# Patient Record
Sex: Female | Born: 1970
Health system: Southern US, Community
[De-identification: ages and names within clinical notes are randomized; demographics above are authoritative.]

## PROBLEM LIST (undated history)

## (undated) DIAGNOSIS — I10 Essential (primary) hypertension: Secondary | ICD-10-CM

## (undated) DIAGNOSIS — M25512 Pain in left shoulder: Secondary | ICD-10-CM

## (undated) DIAGNOSIS — R609 Edema, unspecified: Secondary | ICD-10-CM

## (undated) DIAGNOSIS — M549 Dorsalgia, unspecified: Secondary | ICD-10-CM

## (undated) DIAGNOSIS — E785 Hyperlipidemia, unspecified: Secondary | ICD-10-CM

## (undated) DIAGNOSIS — Z86718 Personal history of other venous thrombosis and embolism: Secondary | ICD-10-CM

## (undated) DIAGNOSIS — R569 Unspecified convulsions: Secondary | ICD-10-CM

## (undated) HISTORY — DX: Personal history of other venous thrombosis and embolism: Z86.718

## (undated) HISTORY — PX: CHOLECYSTECTOMY: SHX55

## (undated) HISTORY — DX: Dorsalgia, unspecified: M54.9

## (undated) HISTORY — DX: Pain in left shoulder: M25.512

## (undated) HISTORY — DX: Hyperlipidemia, unspecified: E78.5

## (undated) HISTORY — DX: Edema, unspecified: R60.9

## (undated) HISTORY — DX: Essential (primary) hypertension: I10

## (undated) HISTORY — DX: Unspecified convulsions: R56.9

---

## 1998-10-07 ENCOUNTER — Other Ambulatory Visit: Admission: RE | Admit: 1998-10-07 | Discharge: 1998-10-07 | Payer: Self-pay | Admitting: Obstetrics and Gynecology

## 1999-10-14 ENCOUNTER — Other Ambulatory Visit: Admission: RE | Admit: 1999-10-14 | Discharge: 1999-10-14 | Payer: Self-pay | Admitting: Obstetrics and Gynecology

## 2000-02-06 ENCOUNTER — Encounter: Admission: RE | Admit: 2000-02-06 | Discharge: 2000-02-06 | Payer: Self-pay | Admitting: Family Medicine

## 2000-02-06 ENCOUNTER — Encounter: Payer: Self-pay | Admitting: Family Medicine

## 2000-02-15 ENCOUNTER — Encounter: Admission: RE | Admit: 2000-02-15 | Discharge: 2000-02-15 | Payer: Self-pay | Admitting: Family Medicine

## 2000-02-15 ENCOUNTER — Encounter: Payer: Self-pay | Admitting: Family Medicine

## 2000-02-29 ENCOUNTER — Encounter (INDEPENDENT_AMBULATORY_CARE_PROVIDER_SITE_OTHER): Payer: Self-pay | Admitting: Specialist

## 2000-02-29 ENCOUNTER — Observation Stay (HOSPITAL_COMMUNITY): Admission: RE | Admit: 2000-02-29 | Discharge: 2000-03-01 | Payer: Self-pay | Admitting: *Deleted

## 2000-10-29 ENCOUNTER — Other Ambulatory Visit: Admission: RE | Admit: 2000-10-29 | Discharge: 2000-10-29 | Payer: Self-pay | Admitting: Obstetrics and Gynecology

## 2002-07-03 ENCOUNTER — Encounter: Admission: RE | Admit: 2002-07-03 | Discharge: 2002-07-03 | Payer: Self-pay | Admitting: Orthopedic Surgery

## 2002-07-03 ENCOUNTER — Encounter: Payer: Self-pay | Admitting: Orthopedic Surgery

## 2005-11-09 ENCOUNTER — Encounter: Admission: RE | Admit: 2005-11-09 | Discharge: 2005-11-09 | Payer: Self-pay | Admitting: Orthopedic Surgery

## 2005-11-09 ENCOUNTER — Observation Stay (HOSPITAL_COMMUNITY): Admission: AD | Admit: 2005-11-09 | Discharge: 2005-11-10 | Payer: Self-pay | Admitting: Orthopedic Surgery

## 2006-02-07 ENCOUNTER — Encounter: Payer: Self-pay | Admitting: Vascular Surgery

## 2006-02-07 ENCOUNTER — Ambulatory Visit (HOSPITAL_COMMUNITY): Admission: RE | Admit: 2006-02-07 | Discharge: 2006-02-07 | Payer: Self-pay | Admitting: Family Medicine

## 2006-05-09 ENCOUNTER — Ambulatory Visit (HOSPITAL_COMMUNITY): Admission: RE | Admit: 2006-05-09 | Discharge: 2006-05-09 | Payer: Self-pay | Admitting: Family Medicine

## 2006-05-09 ENCOUNTER — Ambulatory Visit: Payer: Self-pay | Admitting: Vascular Surgery

## 2006-08-03 ENCOUNTER — Ambulatory Visit (HOSPITAL_COMMUNITY): Admission: RE | Admit: 2006-08-03 | Discharge: 2006-08-03 | Payer: Self-pay | Admitting: Family Medicine

## 2006-08-03 ENCOUNTER — Ambulatory Visit: Payer: Self-pay | Admitting: Vascular Surgery

## 2006-08-03 ENCOUNTER — Encounter: Payer: Self-pay | Admitting: Family Medicine

## 2007-11-06 ENCOUNTER — Encounter: Payer: Self-pay | Admitting: Family Medicine

## 2008-02-07 ENCOUNTER — Encounter: Payer: Self-pay | Admitting: Family Medicine

## 2008-04-20 ENCOUNTER — Ambulatory Visit: Payer: Self-pay | Admitting: Family Medicine

## 2008-04-20 DIAGNOSIS — Z86718 Personal history of other venous thrombosis and embolism: Secondary | ICD-10-CM | POA: Insufficient documentation

## 2008-04-20 DIAGNOSIS — I10 Essential (primary) hypertension: Secondary | ICD-10-CM

## 2008-04-20 DIAGNOSIS — R569 Unspecified convulsions: Secondary | ICD-10-CM

## 2008-04-20 HISTORY — DX: Personal history of other venous thrombosis and embolism: Z86.718

## 2008-04-20 HISTORY — DX: Essential (primary) hypertension: I10

## 2008-04-20 HISTORY — DX: Unspecified convulsions: R56.9

## 2008-05-01 ENCOUNTER — Ambulatory Visit: Payer: Self-pay | Admitting: Family Medicine

## 2008-05-01 LAB — CONVERTED CEMR LAB
Bilirubin Urine: NEGATIVE
Glucose, Urine, Semiquant: NEGATIVE
Specific Gravity, Urine: 1.02
Urobilinogen, UA: 0.2

## 2008-05-04 LAB — CONVERTED CEMR LAB
Albumin: 3.9 g/dL (ref 3.5–5.2)
BUN: 10 mg/dL (ref 6–23)
Calcium: 9.4 mg/dL (ref 8.4–10.5)
Cholesterol: 240 mg/dL (ref 0–200)
Eosinophils Relative: 1.2 % (ref 0.0–5.0)
GFR calc Af Amer: 146 mL/min
Glucose, Bld: 112 mg/dL — ABNORMAL HIGH (ref 70–99)
HCT: 40.3 % (ref 36.0–46.0)
Hemoglobin: 13.9 g/dL (ref 12.0–15.0)
Monocytes Absolute: 0.5 10*3/uL (ref 0.1–1.0)
Monocytes Relative: 7.9 % (ref 3.0–12.0)
Neutro Abs: 4.3 10*3/uL (ref 1.4–7.7)
Phenobarbital: 12.8 ug/mL — ABNORMAL LOW (ref 15.0–40.0)
RDW: 11.9 % (ref 11.5–14.6)
Sodium: 139 meq/L (ref 135–145)
TSH: 2.72 microintl units/mL (ref 0.35–5.50)
Total CHOL/HDL Ratio: 5.9
Total Protein: 7.1 g/dL (ref 6.0–8.3)

## 2008-09-03 ENCOUNTER — Ambulatory Visit: Payer: Self-pay | Admitting: Family Medicine

## 2008-09-03 DIAGNOSIS — M549 Dorsalgia, unspecified: Secondary | ICD-10-CM | POA: Insufficient documentation

## 2008-09-03 HISTORY — DX: Dorsalgia, unspecified: M54.9

## 2008-10-06 ENCOUNTER — Ambulatory Visit: Payer: Self-pay | Admitting: Family Medicine

## 2008-10-06 DIAGNOSIS — N3000 Acute cystitis without hematuria: Secondary | ICD-10-CM

## 2008-10-06 LAB — CONVERTED CEMR LAB
Nitrite: NEGATIVE
Specific Gravity, Urine: 1.015

## 2008-11-20 ENCOUNTER — Telehealth: Payer: Self-pay | Admitting: Family Medicine

## 2008-11-24 ENCOUNTER — Ambulatory Visit: Payer: Self-pay | Admitting: Family Medicine

## 2008-11-26 ENCOUNTER — Telehealth: Payer: Self-pay | Admitting: Family Medicine

## 2008-12-31 ENCOUNTER — Ambulatory Visit: Payer: Self-pay | Admitting: Family Medicine

## 2008-12-31 DIAGNOSIS — S93409A Sprain of unspecified ligament of unspecified ankle, initial encounter: Secondary | ICD-10-CM | POA: Insufficient documentation

## 2009-04-15 ENCOUNTER — Telehealth: Payer: Self-pay | Admitting: Family Medicine

## 2009-05-06 ENCOUNTER — Telehealth: Payer: Self-pay | Admitting: Family Medicine

## 2009-05-07 ENCOUNTER — Ambulatory Visit: Payer: Self-pay | Admitting: Family Medicine

## 2009-05-12 ENCOUNTER — Telehealth (INDEPENDENT_AMBULATORY_CARE_PROVIDER_SITE_OTHER): Payer: Self-pay | Admitting: *Deleted

## 2009-05-18 ENCOUNTER — Telehealth: Payer: Self-pay | Admitting: Family Medicine

## 2009-06-01 ENCOUNTER — Ambulatory Visit: Payer: Self-pay | Admitting: Family Medicine

## 2009-06-15 ENCOUNTER — Ambulatory Visit: Payer: Self-pay | Admitting: Family Medicine

## 2009-06-21 ENCOUNTER — Telehealth: Payer: Self-pay

## 2009-06-21 LAB — CONVERTED CEMR LAB
AST: 19 units/L (ref 0–37)
BUN: 8 mg/dL (ref 6–23)
Basophils Absolute: 0.1 10*3/uL (ref 0.0–0.1)
Calcium: 9.1 mg/dL (ref 8.4–10.5)
Eosinophils Absolute: 0.1 10*3/uL (ref 0.0–0.7)
GFR calc non Af Amer: 120.05 mL/min (ref >60)
Glucose, Bld: 74 mg/dL (ref 70–99)
HCT: 39.1 % (ref 36.0–46.0)
Lymphs Abs: 1.9 10*3/uL (ref 0.7–4.0)
MCHC: 33.3 g/dL (ref 30.0–36.0)
Monocytes Relative: 9 % (ref 3.0–12.0)
Platelets: 296 10*3/uL (ref 150.0–400.0)
Potassium: 3.8 meq/L (ref 3.5–5.1)
RDW: 11.8 % (ref 11.5–14.6)
TSH: 3.66 microintl units/mL (ref 0.35–5.50)
Total Bilirubin: 0.3 mg/dL (ref 0.3–1.2)

## 2009-10-20 ENCOUNTER — Encounter: Payer: Self-pay | Admitting: Family Medicine

## 2009-11-30 ENCOUNTER — Telehealth: Payer: Self-pay | Admitting: Family Medicine

## 2010-04-09 ENCOUNTER — Other Ambulatory Visit (HOSPITAL_COMMUNITY): Payer: Self-pay | Admitting: Obstetrics and Gynecology

## 2010-04-09 DIAGNOSIS — Z3682 Encounter for antenatal screening for nuchal translucency: Secondary | ICD-10-CM

## 2010-04-13 ENCOUNTER — Encounter: Payer: Self-pay | Admitting: Family Medicine

## 2010-04-19 NOTE — Progress Notes (Signed)
Summary: change meds.  Phone Note Call from Patient   Caller: Patient Call For: [name] MD Summary of Call: OB GYN MD would like Dr. Clent Ridges to change her BP medication since she is attempting pregnancy.  [facility] [phone] She is taking Lisinopril/Hctz. Initial call taken by: Lynann Beaver CMA,  May 06, 2009 10:17 AM  Follow-up for Phone Call        we can do this, but she needs an OV to do this Follow-up by: [name] MD,  May 06, 2009 10:34 AM  Additional Follow-up for Phone Call Additional follow up Details #1::        Office visit scheduled. Additional Follow-up by: Lynann Beaver CMA,  May 06, 2009 11:04 AM

## 2010-04-19 NOTE — Progress Notes (Signed)
Summary: refill HCTZ  Phone Note Call from Patient   Caller: Patient Call For: Lauren Salisbury MD Summary of Call: HCTZ 25 mg. ....Marland KitchenMarland Kitchenwants to go back on this due to retaining fluids. The pregnancy has to be put on hold for a while. [facility] [phone] Initial call taken by: Lynann Beaver CMA,  November 30, 2009 4:43 PM  Follow-up for Phone Call        okay, call in #30 with 11 rf Follow-up by: Lauren Salisbury MD,  December 01, 2009 1:08 PM  Additional Follow-up for Phone Call Additional follow up Details #1::        done  pt aware Additional Follow-up by: Pura Spice, RN,  December 01, 2009 1:22 PM    New/Updated Medications: HYDROCHLOROTHIAZIDE 25 MG  TABS (HYDROCHLOROTHIAZIDE) Take 1 tab by mouth every morning Prescriptions: HYDROCHLOROTHIAZIDE 25 MG  TABS (HYDROCHLOROTHIAZIDE) Take 1 tab by mouth every morning  #30 x 11   Entered by:   Pura Spice, RN   Authorized by:   Lauren Salisbury MD   Signed by:   Pura Spice, RN on 12/01/2009   Method used:   Electronically to        [facility]* (retail)       [address]       [address] Shipping/mailing       [place], [place]  [address]       Ph: [phone]       Fax: [phone]   RxID:   [id number]

## 2010-04-19 NOTE — Progress Notes (Signed)
Summary: Lab Results  Phone Note Call from Patient Call back at Home Phone [phone]   Caller: Patient Reason for Call: Acute Illness Summary of Call: Patient looking for lab results. Initial call taken by: Everrett Coombe,  June 21, 2009 8:43 AM  Follow-up for Phone Call        attempt to call - LMTCB if questions labs all norm.  KIK Follow-up by: Duard Brady LPN,  June 21, 2009 10:39 AM

## 2010-04-19 NOTE — Assessment & Plan Note (Signed)
Summary: 2 week fup//ccm   Vital Signs:  Patient profile:   40 year old female Temp:     98.3 degrees F oral BP sitting:   116 / 78  (left arm) Cuff size:   large  Vitals Entered By: Raechel Ache, RN (June 15, 2009 1:07 PM) CC: F/u.   History of Present Illness: Here to follow up on HTN. She is doing well on methyldopa with stable BPs. She continues to have hand and foot swelling, but she is tolerating this well.   Allergies: No Known Drug Allergies  Past History:  Past Medical History: Reviewed history from 04/20/2008 and no changes required. Hypertension complex partial seizures, began at age 29, last was in 1995 LE edema DVT, hx of, in right lower leg 8-07 secondary to ankle fracture, on Coumadin for 9 months left shoulder pain, sees Dr. Chaney Malling sees Dr. Henderson Cloud for GYN exams  Review of Systems  The patient denies anorexia, fever, weight loss, weight gain, vision loss, decreased hearing, hoarseness, chest pain, syncope, dyspnea on exertion, prolonged cough, headaches, hemoptysis, abdominal pain, melena, hematochezia, severe indigestion/heartburn, hematuria, incontinence, genital sores, muscle weakness, suspicious skin lesions, transient blindness, difficulty walking, depression, unusual weight change, abnormal bleeding, enlarged lymph nodes, angioedema, breast masses, and testicular masses.    Physical Exam  General:  overweight-appearing.   Neck:  No deformities, masses, or tenderness noted. Lungs:  Normal respiratory effort, chest expands symmetrically. Lungs are clear to auscultation, no crackles or wheezes. Heart:  Normal rate and regular rhythm. S1 and S2 normal without gallop, murmur, click, rub or other extra sounds. Extremities:  2+ edema of hands and feet    Impression & Recommendations:  Problem # 1:  HYPERTENSION (ICD-401.9)  Her updated medication list for this problem includes:    Methyldopa 250 Mg Tabs (Methyldopa) .Marland Kitchen..Marland Kitchen Two times a  day  Orders: Venipuncture ([address]) TLB-BMP (Basic Metabolic Panel-BMET) (80048-METABOL) TLB-CBC Platelet - w/Differential (85025-CBCD) TLB-Hepatic/Liver Function Pnl (80076-HEPATIC) TLB-TSH (Thyroid Stimulating Hormone) (84443-TSH) UA Dipstick w/o Micro (automated)  (81003)  Complete Medication List: 1)  Phenobarbital 100 Mg Tabs (Phenobarbital) .... One tablet twice daily 2)  Diazepam 5 Mg Tabs (Diazepam) .... One by mouth q 8 hours as needed 3)  Methyldopa 250 Mg Tabs (Methyldopa) .... Two times a day  Patient Instructions: 1)  Get labs today.  2)  Please schedule a follow-up appointment in 3 months .   Appended Document: 2 week fup//ccm  Laboratory Results   Urine Tests  Date/Time Received: June 15, 2009 3:22 PM Date/Time Reported: June 15, 2009 3:22 PM  Routine Urinalysis   Color: yellow Appearance: Clear Glucose: negative   (Normal Range: Negative) Bilirubin: negative   (Normal Range: Negative) Ketone: trace (5)   (Normal Range: Negative) Spec. Gravity: >=1.030   (Normal Range: 1.003-1.035) Blood: negative   (Normal Range: Negative) pH: 5.0   (Normal Range: 5.0-8.0) Protein: negative   (Normal Range: Negative) Urobilinogen: 0.2   (Normal Range: 0-1) Nitrite: negative   (Normal Range: Negative) Leukocyte Esterace: trace   (Normal Range: Negative)    Comments: Rita Ohara  June 15, 2009 3:22 PM

## 2010-04-19 NOTE — Assessment & Plan Note (Signed)
Summary: discuss changing BP meds due to pregnancy?dm   Vital Signs:  Patient profile:   40 year old female Temp:     98.5 degrees F oral Pulse rate:   107 / minute BP sitting:   114 / 94  (left arm) Cuff size:   large  Vitals Entered By: Alfred Levins, CMA (May 07, 2009 4:23 PM) CC: needs to change  BP med per OB dr, she is planning a pregnancy   History of Present Illness: Here to discuss BP meds. She has been on HCTZ and lisinopril for several years and has done well. She is considering trying to get pregnant soon, and she has discussed this with Dr. Henderson Cloud. She was concerned about her current meds and asked [place] to consider changing them. Lauren Espinoza feels fine.   Current Medications (verified): 1)  Lisinopril 20 Mg Tabs (Lisinopril) .Marland Kitchen.. 1 Tablet By Mouth Daily 2)  Hydrochlorothiazide 25 Mg  Tabs (Hydrochlorothiazide) .... Take 1 Tab By Mouth Every Morning 3)  Phenobarbital 100 Mg Tabs (Phenobarbital) .... One Tablet Twice Daily 4)  Diazepam 5 Mg Tabs (Diazepam) .... One By Mouth Q 8 Hours As Needed  Allergies (verified): No Known Drug Allergies  Past History:  Past Medical History: Reviewed history from 04/20/2008 and no changes required. Hypertension complex partial seizures, began at age 49, last was in 1995 LE edema DVT, hx of, in right lower leg 8-07 secondary to ankle fracture, on Coumadin for 9 months left shoulder pain, sees Dr. Chaney Malling sees Dr. Henderson Cloud for GYN exams  Past Surgical History: Reviewed history from 04/20/2008 and no changes required. Cholecystectomy  Review of Systems  The patient denies anorexia, fever, weight loss, weight gain, vision loss, decreased hearing, hoarseness, chest pain, syncope, dyspnea on exertion, peripheral edema, prolonged cough, headaches, hemoptysis, abdominal pain, melena, hematochezia, severe indigestion/heartburn, hematuria, incontinence, genital sores, muscle weakness, suspicious skin lesions, transient blindness,  difficulty walking, depression, unusual weight change, abnormal bleeding, enlarged lymph nodes, angioedema, breast masses, and testicular masses.    Physical Exam  General:  overweight-appearing.   Neck:  No deformities, masses, or tenderness noted. Lungs:  Normal respiratory effort, chest expands symmetrically. Lungs are clear to auscultation, no crackles or wheezes. Heart:  Normal rate and regular rhythm. S1 and S2 normal without gallop, murmur, click, rub or other extra sounds. Extremities:  no edema   Impression & Recommendations:  Problem # 1:  HYPERTENSION (ICD-401.9)  The following medications were removed from the medication list:    Lisinopril 20 Mg Tabs (Lisinopril) .Marland Kitchen... 1 tablet by mouth daily    Hydrochlorothiazide 25 Mg Tabs (Hydrochlorothiazide) .Marland Kitchen... Take 1 tab by mouth every morning Her updated medication list for this problem includes:    Methyldopa 250 Mg Tabs (Methyldopa) .Marland Kitchen..Marland Kitchen Two times a day  Complete Medication List: 1)  Phenobarbital 100 Mg Tabs (Phenobarbital) .... One tablet twice daily 2)  Diazepam 5 Mg Tabs (Diazepam) .... One by mouth q 8 hours as needed 3)  Methyldopa 250 Mg Tabs (Methyldopa) .... Two times a day  Patient Instructions: 1)  We will stop lisinopril and HCTZ, and will start methyldopa.  2)  Please schedule a follow-up appointment in 1 month. At that time we will get labs also. Prescriptions: METHYLDOPA 250 MG TABS (METHYLDOPA) two times a day  #60 x 11   Entered and Authorized by:   [name] MD   Signed by:   [name] MD on 05/07/2009   Method used:   Electronically to  [facility] [address] [phone]* (retail)       [address]       [place], [place]  [address]       Ph: [phone] or [phone]       Fax: [phone]   RxID:   [id number]

## 2010-04-19 NOTE — Progress Notes (Signed)
Summary: new med  Phone Note Call from Patient   Caller: Patient Call For: [name] MD Summary of Call: called re: change in medicine- she is swelling since going off HCT- on new BP med, BP ok so far. What to do about swelling? phone [phone] Initial call taken by: Raechel Ache, RN,  May 12, 2009 12:37 PM  Follow-up for Phone Call        if possible I would simply stay on this for awhile. it will take your body about 2-3 weeks to adjust being off the diuretic, and I think the swelling will improve. Let us know Follow-up by: [name] MD,  May 12, 2009 4:36 PM  Additional Follow-up for Phone Call Additional follow up Details #1::        left msg with Dr Claris Che advice. Additional Follow-up by: Raechel Ache, RN,  May 12, 2009 4:54 PM

## 2010-04-19 NOTE — Progress Notes (Signed)
Summary: hctz?  Phone Note Call from Patient Call back at [phone]   Caller: Patient-----voicemail Reason for Call: Talk to Nurse Summary of Call: She has swelling in her feet for the past few days. She cannot wear her shoes, epecially yesterday. She took a fliud pill last night. it did help some. Still swollen. Does she need to go back on HCTZ, that she was taken off of? Initial call taken by: Warnell Forester,  May 18, 2009 9:54 AM  Follow-up for Phone Call        she needs to call her ObGyn doctor about this. I would be happy to put her on a diuretic, but they need to advise [place] if any would be safe for her and the baby Follow-up by: [name] MD,  May 18, 2009 1:05 PM  Additional Follow-up for Phone Call Additional follow up Details #1::        patient notified. Additional Follow-up by: Raechel Ache, RN,  May 18, 2009 3:34 PM

## 2010-04-19 NOTE — Progress Notes (Signed)
Summary: wants diflucan  Phone Note Call from Patient Call back at [phone]   Caller: Patient-   voicemail Summary of Call: pt has a yeast infection. Wants difflucan called to [facility] on [address]--[phone] Initial call taken by: Warnell Forester,  April 15, 2009 8:57 AM  Follow-up for Phone Call        call in 150mg  tabs, use as needed , #1 with 5 rf Follow-up by: [name] MD,  April 15, 2009 11:46 AM  Additional Follow-up for Phone Call Additional follow up Details #1::        Phone Call Completed, Rx Called In Additional Follow-up by: Alfred Levins, CMA,  April 15, 2009 12:55 PM    New/Updated Medications: DIFLUCAN 150 MG TABS (FLUCONAZOLE) once daily Prescriptions: DIFLUCAN 150 MG TABS (FLUCONAZOLE) once daily  #1 x 5   Entered by:   Alfred Levins, CMA   Authorized by:   [name] MD   Signed by:   Alfred Levins, CMA on 04/15/2009   Method used:   Electronically to        [facility] [address] [phone]* (retail)       [address]       [place], [place]  [address]       Ph: [phone] or [phone]       Fax: [phone]   RxID:   [id number]

## 2010-04-19 NOTE — Assessment & Plan Note (Signed)
Summary: 3 wk rov/ok per doc/njr---PT [facility] // RS   Vital Signs:  Patient profile:   40 year old female Temp:     98.3 degrees F oral BP sitting:   108 / 80  (left arm) Cuff size:   large  Vitals Entered By: Raechel Ache, RN (June 01, 2009 1:08 PM) CC: 3 week f/u; had to go back on HCT due to swelling.   History of Present Illness: Here to follow up edema and HTN. Several weeks ago we switched her to methyldopa since she wants to get pregnant. As soon as she stopped HCTZ, however, she developed swelling in the hands and feet which was quite uncomfortable. Although Dr. Kittie Plater office advised against taking HCTZ, she has started back on it anyway. Now the swelling is under control again. She feels fine in general.   Allergies: No Known Drug Allergies  Past History:  Past Medical History: Reviewed history from 04/20/2008 and no changes required. Hypertension complex partial seizures, began at age 47, last was in 1995 LE edema DVT, hx of, in right lower leg 8-07 secondary to ankle fracture, on Coumadin for 9 months left shoulder pain, sees Dr. Chaney Malling sees Dr. Henderson Cloud for GYN exams  Review of Systems  The patient denies anorexia, fever, weight loss, weight gain, vision loss, decreased hearing, hoarseness, chest pain, syncope, dyspnea on exertion, prolonged cough, headaches, hemoptysis, abdominal pain, melena, hematochezia, severe indigestion/heartburn, hematuria, incontinence, genital sores, muscle weakness, suspicious skin lesions, transient blindness, difficulty walking, depression, unusual weight change, abnormal bleeding, enlarged lymph nodes, angioedema, breast masses, and testicular masses.    Physical Exam  General:  overweight-appearing.   Lungs:  Normal respiratory effort, chest expands symmetrically. Lungs are clear to auscultation, no crackles or wheezes. Heart:  Normal rate and regular rhythm. S1 and S2 normal without gallop, murmur, click, rub or other extra  sounds. Extremities:  1+ left pedal edema and 1+ right pedal edema.     Impression & Recommendations:  Problem # 1:  HYPERTENSION (ICD-401.9)  Her updated medication list for this problem includes:    Methyldopa 250 Mg Tabs (Methyldopa) .Marland Kitchen..Marland Kitchen Two times a day  Complete Medication List: 1)  Phenobarbital 100 Mg Tabs (Phenobarbital) .... One tablet twice daily 2)  Diazepam 5 Mg Tabs (Diazepam) .... One by mouth q 8 hours as needed 3)  Methyldopa 250 Mg Tabs (Methyldopa) .... Two times a day  Patient Instructions: 1)  I agree with Dr. Henderson Cloud that [place] should avoid HCTZ as it has a pregnancy rating of "D". She will stop HCTZ and continue on methyldopa. I advised her to severely restrict her dietary intake of sodium, and she is to take in no more than 1000 mg a day of this.  2)  Please schedule a follow-up appointment in 2 weeks.  Prescriptions: PHENOBARBITAL 100 MG TABS (PHENOBARBITAL) one tablet twice daily  #180 x 1   Entered and Authorized by:   [name] MD   Signed by:   [name] MD on 06/01/2009   Method used:   Print then Give to Patient   RxID:   [id number]

## 2010-04-22 ENCOUNTER — Ambulatory Visit (INDEPENDENT_AMBULATORY_CARE_PROVIDER_SITE_OTHER): Payer: 59 | Admitting: Family Medicine

## 2010-04-22 ENCOUNTER — Encounter: Payer: Self-pay | Admitting: Family Medicine

## 2010-04-22 DIAGNOSIS — G40909 Epilepsy, unspecified, not intractable, without status epilepticus: Secondary | ICD-10-CM

## 2010-04-22 DIAGNOSIS — I10 Essential (primary) hypertension: Secondary | ICD-10-CM

## 2010-04-22 MED ORDER — PHENOBARBITAL 100 MG PO TABS: 100.0000 mg | ORAL_TABLET | Freq: Two times a day (BID) | ORAL | Status: DC

## 2010-04-22 MED ORDER — METHYLDOPA 250 MG PO TABS: 250.0000 mg | ORAL_TABLET | Freq: Two times a day (BID) | ORAL | Status: DC

## 2010-04-22 NOTE — Progress Notes (Signed)
  Subjective:    Patient ID: Lauren Espinoza, female    DOB: 04/13/1970, 40 y.o.   MRN: [id number]  HPI Here for med refills. She is 12 and 1/[redacted] weeks pregnant and is seeing Dr. Henderson Cloud. The pregnancy is going well, and she feels well. Her BP at the GYN office is consistently in the 120s over 80s.    Review of Systems  Constitutional: Negative.   Respiratory: Negative.  Negative for chest tightness and shortness of breath.   Cardiovascular: Negative.  Negative for chest pain, palpitations and leg swelling.  Neurological: Negative.  Negative for tremors, seizures, weakness, light-headedness and headaches.       Objective:   Physical Exam  Constitutional: She appears well-developed and well-nourished.  Neck: No thyromegaly present.  Cardiovascular: Normal rate, regular rhythm, normal heart sounds and intact distal pulses.  Exam reveals no gallop and no friction rub.   No murmur heard. Pulmonary/Chest: Effort normal and breath sounds normal. No respiratory distress. She has no wheezes. She has no rales. She exhibits no tenderness.          Assessment & Plan:  Doing well, her BP is stable.

## 2010-04-28 ENCOUNTER — Ambulatory Visit (HOSPITAL_COMMUNITY): Admission: RE | Admit: 2010-04-28 | Payer: 59 | Source: Ambulatory Visit

## 2010-04-28 ENCOUNTER — Other Ambulatory Visit (HOSPITAL_COMMUNITY): Payer: Self-pay | Admitting: Obstetrics and Gynecology

## 2010-04-28 ENCOUNTER — Ambulatory Visit (HOSPITAL_COMMUNITY)
Admission: RE | Admit: 2010-04-28 | Discharge: 2010-04-28 | Disposition: A | Discharge status: Home / Self Care | Payer: 59 | Source: Ambulatory Visit | Attending: Obstetrics and Gynecology | Admitting: Obstetrics and Gynecology

## 2010-04-28 ENCOUNTER — Other Ambulatory Visit (HOSPITAL_COMMUNITY): Payer: Self-pay | Admitting: Maternal and Fetal Medicine

## 2010-04-28 DIAGNOSIS — E669 Obesity, unspecified: Secondary | ICD-10-CM | POA: Insufficient documentation

## 2010-04-28 DIAGNOSIS — O09519 Supervision of elderly primigravida, unspecified trimester: Secondary | ICD-10-CM | POA: Insufficient documentation

## 2010-04-28 DIAGNOSIS — O351XX0 Maternal care for (suspected) chromosomal abnormality in fetus, not applicable or unspecified: Secondary | ICD-10-CM | POA: Insufficient documentation

## 2010-04-28 DIAGNOSIS — Z0489 Encounter for examination and observation for other specified reasons: Secondary | ICD-10-CM

## 2010-04-28 DIAGNOSIS — Z3682 Encounter for antenatal screening for nuchal translucency: Secondary | ICD-10-CM

## 2010-04-28 DIAGNOSIS — O10019 Pre-existing essential hypertension complicating pregnancy, unspecified trimester: Secondary | ICD-10-CM | POA: Insufficient documentation

## 2010-04-28 DIAGNOSIS — O99210 Obesity complicating pregnancy, unspecified trimester: Secondary | ICD-10-CM | POA: Insufficient documentation

## 2010-04-28 DIAGNOSIS — O3510X0 Maternal care for (suspected) chromosomal abnormality in fetus, unspecified, not applicable or unspecified: Secondary | ICD-10-CM | POA: Insufficient documentation

## 2010-05-20 ENCOUNTER — Other Ambulatory Visit (HOSPITAL_COMMUNITY): Payer: Self-pay | Admitting: Obstetrics and Gynecology

## 2010-05-20 DIAGNOSIS — Z3689 Encounter for other specified antenatal screening: Secondary | ICD-10-CM

## 2010-06-06 ENCOUNTER — Encounter: Payer: Self-pay | Admitting: Family Medicine

## 2010-06-06 ENCOUNTER — Ambulatory Visit (INDEPENDENT_AMBULATORY_CARE_PROVIDER_SITE_OTHER): Payer: 59 | Admitting: Family Medicine

## 2010-06-06 VITALS — BP 142/90 | Temp 99.0°F

## 2010-06-06 DIAGNOSIS — I10 Essential (primary) hypertension: Secondary | ICD-10-CM

## 2010-06-06 DIAGNOSIS — S86919A Strain of unspecified muscle(s) and tendon(s) at lower leg level, unspecified leg, initial encounter: Secondary | ICD-10-CM

## 2010-06-06 DIAGNOSIS — S838X9A Sprain of other specified parts of unspecified knee, initial encounter: Secondary | ICD-10-CM

## 2010-06-06 NOTE — Progress Notes (Signed)
  Subjective:    Patient ID: Lauren Espinoza, female    DOB: March 12, 1971, 40 y.o.   MRN: [id number]  HPI  patient seen with chief complaint left knee pain. Onset 3 days ago after getting in bed. She's not sure of specific injury. Pain is medial knee. No locking or giving way. No visible swelling. No ecchymosis.   she is [redacted] weeks pregnant. Has taken Tylenol with minimal relief. She knows not to take nonsteroidals. Remote history of DVT right calf after immobilization from ankle injury. She has not had any left leg or calf edema or pain.    history of hypertension currently treated with [place]. Home blood pressures around 140/90. No headaches. No progressive edema.   Review of Systems     Objective:   Physical Exam     Patient is alert and in no distress. Repeat blood pressure left arm seated after rest 142/90 with similar reading right arm  Neck supple no mass Chest clear to auscultation Heart regular rhythm and rate Extremities left knee no effusion. Full range of motion. Minimal medial joint line tenderness. Collateral and cruciate ligament testing is normal. No leg edema or tenderness.    Assessment & Plan:   Left knee strain. Suspect mild meniscal strain. Patient knows not to take nonsteroidals. Continue Tylenol for symptom relief. followup with primary in 2 weeks if no better.  BP is not well eassess.controlled.  She is encouraged to follow up with OBGYN to reassess.

## 2010-06-06 NOTE — Patient Instructions (Signed)
Continue to monitor blood pressure closely and follow up with gynecologist

## 2010-06-13 ENCOUNTER — Other Ambulatory Visit (HOSPITAL_COMMUNITY): Payer: Self-pay | Admitting: Obstetrics and Gynecology

## 2010-06-13 ENCOUNTER — Ambulatory Visit (HOSPITAL_COMMUNITY)
Admission: RE | Admit: 2010-06-13 | Discharge: 2010-06-13 | Disposition: A | Discharge status: Home / Self Care | Payer: 59 | Source: Ambulatory Visit | Attending: Obstetrics and Gynecology | Admitting: Obstetrics and Gynecology

## 2010-06-13 DIAGNOSIS — O09519 Supervision of elderly primigravida, unspecified trimester: Secondary | ICD-10-CM | POA: Insufficient documentation

## 2010-06-13 DIAGNOSIS — Z1389 Encounter for screening for other disorder: Secondary | ICD-10-CM | POA: Insufficient documentation

## 2010-06-13 DIAGNOSIS — Z3689 Encounter for other specified antenatal screening: Secondary | ICD-10-CM

## 2010-06-13 DIAGNOSIS — E669 Obesity, unspecified: Secondary | ICD-10-CM | POA: Insufficient documentation

## 2010-06-13 DIAGNOSIS — O99210 Obesity complicating pregnancy, unspecified trimester: Secondary | ICD-10-CM | POA: Insufficient documentation

## 2010-06-13 DIAGNOSIS — Z363 Encounter for antenatal screening for malformations: Secondary | ICD-10-CM | POA: Insufficient documentation

## 2010-06-13 DIAGNOSIS — O358XX0 Maternal care for other (suspected) fetal abnormality and damage, not applicable or unspecified: Secondary | ICD-10-CM | POA: Insufficient documentation

## 2010-06-16 ENCOUNTER — Other Ambulatory Visit (HOSPITAL_COMMUNITY): Payer: 59

## 2010-07-07 ENCOUNTER — Other Ambulatory Visit (HOSPITAL_COMMUNITY): Payer: Self-pay | Admitting: Obstetrics and Gynecology

## 2010-07-07 DIAGNOSIS — Z3689 Encounter for other specified antenatal screening: Secondary | ICD-10-CM

## 2010-07-12 ENCOUNTER — Ambulatory Visit (HOSPITAL_COMMUNITY)
Admission: RE | Admit: 2010-07-12 | Discharge: 2010-07-12 | Disposition: A | Discharge status: Home / Self Care | Payer: 59 | Source: Ambulatory Visit | Attending: Obstetrics and Gynecology | Admitting: Obstetrics and Gynecology

## 2010-07-12 ENCOUNTER — Other Ambulatory Visit (HOSPITAL_COMMUNITY): Payer: Self-pay | Admitting: Obstetrics and Gynecology

## 2010-07-12 DIAGNOSIS — E669 Obesity, unspecified: Secondary | ICD-10-CM | POA: Insufficient documentation

## 2010-07-12 DIAGNOSIS — Z3689 Encounter for other specified antenatal screening: Secondary | ICD-10-CM

## 2010-07-12 DIAGNOSIS — O09519 Supervision of elderly primigravida, unspecified trimester: Secondary | ICD-10-CM | POA: Insufficient documentation

## 2010-07-12 DIAGNOSIS — G40909 Epilepsy, unspecified, not intractable, without status epilepticus: Secondary | ICD-10-CM | POA: Insufficient documentation

## 2010-07-12 DIAGNOSIS — O10019 Pre-existing essential hypertension complicating pregnancy, unspecified trimester: Secondary | ICD-10-CM | POA: Insufficient documentation

## 2010-07-14 ENCOUNTER — Other Ambulatory Visit (HOSPITAL_COMMUNITY): Payer: Self-pay | Admitting: Obstetrics and Gynecology

## 2010-07-14 DIAGNOSIS — O09529 Supervision of elderly multigravida, unspecified trimester: Secondary | ICD-10-CM

## 2010-07-14 DIAGNOSIS — O358XX0 Maternal care for other (suspected) fetal abnormality and damage, not applicable or unspecified: Secondary | ICD-10-CM

## 2010-07-19 ENCOUNTER — Inpatient Hospital Stay (HOSPITAL_COMMUNITY)
Admission: AD | Admit: 2010-07-19 | Discharge: 2010-07-19 | Disposition: A | Discharge status: Home / Self Care | Payer: 59 | Source: Ambulatory Visit | Attending: Obstetrics & Gynecology | Admitting: Obstetrics & Gynecology

## 2010-07-19 DIAGNOSIS — O10019 Pre-existing essential hypertension complicating pregnancy, unspecified trimester: Secondary | ICD-10-CM

## 2010-07-19 LAB — URINALYSIS, DIPSTICK ONLY
Bilirubin Urine: NEGATIVE
Leukocytes, UA: NEGATIVE
Nitrite: NEGATIVE
Specific Gravity, Urine: 1.025 (ref 1.005–1.030)
Urobilinogen, UA: 0.2 mg/dL (ref 0.0–1.0)
pH: 6 (ref 5.0–8.0)

## 2010-07-19 LAB — CBC
MCH: 29.9 pg (ref 26.0–34.0)
MCHC: 33.2 g/dL (ref 30.0–36.0)
MCV: 90 fL (ref 78.0–100.0)
Platelets: 234 10*3/uL (ref 150–400)

## 2010-07-19 LAB — COMPREHENSIVE METABOLIC PANEL
AST: 15 U/L (ref 0–37)
BUN: 8 mg/dL (ref 6–23)
CO2: 20 mEq/L (ref 19–32)
Calcium: 9.2 mg/dL (ref 8.4–10.5)
Chloride: 100 mEq/L (ref 96–112)
Creatinine, Ser: <0.47 mg/dL (ref 0.4–1.2)
Total Bilirubin: 0.1 mg/dL — ABNORMAL LOW (ref 0.3–1.2)

## 2010-07-19 LAB — URIC ACID: Uric Acid, Serum: 7.1 mg/dL — ABNORMAL HIGH (ref 2.4–7.0)

## 2010-07-22 ENCOUNTER — Inpatient Hospital Stay (HOSPITAL_COMMUNITY)
Admission: AD | Admit: 2010-07-22 | Discharge: 2010-07-30 | DRG: 765 | Disposition: A | Discharge status: Home / Self Care | Payer: 59 | Source: Ambulatory Visit | Attending: Obstetrics and Gynecology | Admitting: Obstetrics and Gynecology

## 2010-07-22 ENCOUNTER — Ambulatory Visit (HOSPITAL_COMMUNITY)
Admission: RE | Admit: 2010-07-22 | Discharge: 2010-07-22 | Disposition: A | Discharge status: Home / Self Care | Payer: 59 | Source: Ambulatory Visit | Attending: Obstetrics and Gynecology | Admitting: Obstetrics and Gynecology

## 2010-07-22 DIAGNOSIS — O09519 Supervision of elderly primigravida, unspecified trimester: Secondary | ICD-10-CM | POA: Insufficient documentation

## 2010-07-22 DIAGNOSIS — O10019 Pre-existing essential hypertension complicating pregnancy, unspecified trimester: Secondary | ICD-10-CM | POA: Insufficient documentation

## 2010-07-22 DIAGNOSIS — O99214 Obesity complicating childbirth: Secondary | ICD-10-CM | POA: Diagnosis present

## 2010-07-22 DIAGNOSIS — O2690 Pregnancy related conditions, unspecified, unspecified trimester: Secondary | ICD-10-CM

## 2010-07-22 DIAGNOSIS — O1414 Severe pre-eclampsia complicating childbirth: Principal | ICD-10-CM | POA: Diagnosis present

## 2010-07-22 DIAGNOSIS — E669 Obesity, unspecified: Secondary | ICD-10-CM | POA: Diagnosis present

## 2010-07-22 DIAGNOSIS — O09529 Supervision of elderly multigravida, unspecified trimester: Secondary | ICD-10-CM

## 2010-07-22 DIAGNOSIS — O358XX0 Maternal care for other (suspected) fetal abnormality and damage, not applicable or unspecified: Secondary | ICD-10-CM

## 2010-07-22 DIAGNOSIS — G40909 Epilepsy, unspecified, not intractable, without status epilepticus: Secondary | ICD-10-CM | POA: Insufficient documentation

## 2010-07-22 LAB — COMPREHENSIVE METABOLIC PANEL
ALT: 11 U/L (ref 0–35)
AST: 17 U/L (ref 0–37)
Albumin: 2.5 g/dL — ABNORMAL LOW (ref 3.5–5.2)
Alkaline Phosphatase: 69 U/L (ref 39–117)
CO2: 19 mEq/L (ref 19–32)
Chloride: 105 mEq/L (ref 96–112)
Creatinine, Ser: <0.47 mg/dL (ref 0.4–1.2)
Potassium: 3.8 mEq/L (ref 3.5–5.1)
Sodium: 137 mEq/L (ref 135–145)
Total Bilirubin: 0.2 mg/dL — ABNORMAL LOW (ref 0.3–1.2)

## 2010-07-22 LAB — CBC
Hemoglobin: 11.7 g/dL — ABNORMAL LOW (ref 12.0–15.0)
Platelets: 255 10*3/uL (ref 150–400)
RBC: 3.95 MIL/uL (ref 3.87–5.11)
WBC: 8.6 10*3/uL (ref 4.0–10.5)

## 2010-07-23 LAB — URINALYSIS, MICROSCOPIC ONLY
Glucose, UA: NEGATIVE mg/dL
Leukocytes, UA: NEGATIVE
Protein, ur: >300 mg/dL — AB
Specific Gravity, Urine: >1.03 — ABNORMAL HIGH (ref 1.005–1.030)

## 2010-07-23 LAB — HEMOGLOBIN A1C: Hgb A1c MFr Bld: 5.8 % — ABNORMAL HIGH (ref <5.7)

## 2010-07-24 LAB — STREP B DNA PROBE: Strep Group B Ag: NEGATIVE

## 2010-07-24 LAB — URINE CULTURE

## 2010-07-24 LAB — GLUCOSE, CAPILLARY: Glucose-Capillary: 93 mg/dL (ref 70–99)

## 2010-07-25 LAB — CREATININE CLEARANCE, URINE, 24 HOUR
Collection Interval-CRCL: 24 hours
Creatinine Clearance: 256 mL/min — ABNORMAL HIGH (ref 75–115)
Creatinine, Urine: 167.7 mg/dL
Creatinine: 0.5 mg/dL (ref 0.4–1.2)

## 2010-07-25 LAB — PROTEIN, URINE, 24 HOUR
Protein, 24H Urine: 10021 mg/d — ABNORMAL HIGH (ref 50–100)
Urine Total Volume-UPROT: 1100 mL

## 2010-07-25 LAB — CBC
MCV: 90.9 fL (ref 78.0–100.0)
Platelets: 248 10*3/uL (ref 150–400)
RBC: 3.63 MIL/uL — ABNORMAL LOW (ref 3.87–5.11)
RDW: 12.9 % (ref 11.5–15.5)
WBC: 9.5 10*3/uL (ref 4.0–10.5)

## 2010-07-25 LAB — COMPREHENSIVE METABOLIC PANEL
ALT: 11 U/L (ref 0–35)
AST: 15 U/L (ref 0–37)
Albumin: 2.4 g/dL — ABNORMAL LOW (ref 3.5–5.2)
Alkaline Phosphatase: 61 U/L (ref 39–117)
Calcium: 8.4 mg/dL (ref 8.4–10.5)
GFR calc Af Amer: >60 mL/min (ref >60)
Potassium: 3.9 mEq/L (ref 3.5–5.1)
Sodium: 134 mEq/L — ABNORMAL LOW (ref 135–145)
Total Protein: 5.2 g/dL — ABNORMAL LOW (ref 6.0–8.3)

## 2010-07-25 LAB — GLUCOSE, CAPILLARY: Glucose-Capillary: 83 mg/dL (ref 70–99)

## 2010-07-26 ENCOUNTER — Inpatient Hospital Stay (HOSPITAL_COMMUNITY): Payer: 59

## 2010-07-26 LAB — GLUCOSE, CAPILLARY: Glucose-Capillary: 82 mg/dL (ref 70–99)

## 2010-07-26 LAB — COMPREHENSIVE METABOLIC PANEL
ALT: 11 U/L (ref 0–35)
AST: 13 U/L (ref 0–37)
CO2: 23 mEq/L (ref 19–32)
Calcium: 8.6 mg/dL (ref 8.4–10.5)
Chloride: 106 mEq/L (ref 96–112)
GFR calc Af Amer: >60 mL/min (ref >60)
GFR calc non Af Amer: >60 mL/min (ref >60)
Glucose, Bld: 92 mg/dL (ref 70–99)
Sodium: 139 mEq/L (ref 135–145)
Total Bilirubin: 0.1 mg/dL — ABNORMAL LOW (ref 0.3–1.2)

## 2010-07-26 LAB — CBC
HCT: 35.8 % — ABNORMAL LOW (ref 36.0–46.0)
Hemoglobin: 11.4 g/dL — ABNORMAL LOW (ref 12.0–15.0)
MCH: 29.5 pg (ref 26.0–34.0)
MCHC: 31.8 g/dL (ref 30.0–36.0)
RBC: 3.87 MIL/uL (ref 3.87–5.11)

## 2010-07-26 LAB — LACTATE DEHYDROGENASE: LDH: 189 U/L (ref 94–250)

## 2010-07-27 ENCOUNTER — Other Ambulatory Visit: Payer: Self-pay | Admitting: Obstetrics and Gynecology

## 2010-07-27 LAB — DIFFERENTIAL
Basophils Absolute: 0 10*3/uL (ref 0.0–0.1)
Basophils Relative: 0 % (ref 0–1)
Eosinophils Absolute: 0.1 10*3/uL (ref 0.0–0.7)
Eosinophils Relative: 1 % (ref 0–5)
Monocytes Absolute: 0.5 10*3/uL (ref 0.1–1.0)
Monocytes Relative: 6 % (ref 3–12)

## 2010-07-27 LAB — COMPREHENSIVE METABOLIC PANEL
ALT: 11 U/L (ref 0–35)
AST: 14 U/L (ref 0–37)
Albumin: 2.2 g/dL — ABNORMAL LOW (ref 3.5–5.2)
CO2: 22 mEq/L (ref 19–32)
Calcium: 8.2 mg/dL — ABNORMAL LOW (ref 8.4–10.5)
GFR calc Af Amer: >60 mL/min (ref >60)
Sodium: 132 mEq/L — ABNORMAL LOW (ref 135–145)
Total Protein: 5.3 g/dL — ABNORMAL LOW (ref 6.0–8.3)

## 2010-07-27 LAB — GLUCOSE TOLERANCE, 1 HOUR: Glucose, 1 Hour GTT: 174 mg/dL — ABNORMAL HIGH (ref 70–140)

## 2010-07-27 LAB — CBC
MCH: 29.9 pg (ref 26.0–34.0)
MCHC: 32.8 g/dL (ref 30.0–36.0)
Platelets: 237 10*3/uL (ref 150–400)
RDW: 12.9 % (ref 11.5–15.5)

## 2010-07-28 LAB — COMPREHENSIVE METABOLIC PANEL
ALT: 9 U/L (ref 0–35)
Alkaline Phosphatase: 63 U/L (ref 39–117)
CO2: 24 mEq/L (ref 19–32)
GFR calc non Af Amer: >60 mL/min (ref >60)
Glucose, Bld: 98 mg/dL (ref 70–99)
Potassium: 4.5 mEq/L (ref 3.5–5.1)
Sodium: 135 mEq/L (ref 135–145)

## 2010-07-28 LAB — CBC
HCT: 32 % — ABNORMAL LOW (ref 36.0–46.0)
Hemoglobin: 10.3 g/dL — ABNORMAL LOW (ref 12.0–15.0)
MCHC: 32.2 g/dL (ref 30.0–36.0)
WBC: 9.6 10*3/uL (ref 4.0–10.5)

## 2010-07-28 LAB — MRSA PCR SCREENING: MRSA by PCR: NEGATIVE

## 2010-07-29 ENCOUNTER — Inpatient Hospital Stay (HOSPITAL_COMMUNITY): Payer: 59

## 2010-07-29 LAB — COMPREHENSIVE METABOLIC PANEL
Albumin: 2 g/dL — ABNORMAL LOW (ref 3.5–5.2)
Alkaline Phosphatase: 60 U/L (ref 39–117)
BUN: 14 mg/dL (ref 6–23)
Chloride: 100 mEq/L (ref 96–112)
Glucose, Bld: 90 mg/dL (ref 70–99)
Potassium: 3.9 mEq/L (ref 3.5–5.1)
Total Bilirubin: 0.1 mg/dL — ABNORMAL LOW (ref 0.3–1.2)

## 2010-07-29 LAB — CBC
Platelets: 209 10*3/uL (ref 150–400)
RDW: 13.4 % (ref 11.5–15.5)
WBC: 9.3 10*3/uL (ref 4.0–10.5)

## 2010-07-30 LAB — BASIC METABOLIC PANEL
Calcium: 8.4 mg/dL (ref 8.4–10.5)
GFR calc non Af Amer: >60 mL/min (ref >60)
Glucose, Bld: 117 mg/dL — ABNORMAL HIGH (ref 70–99)
Sodium: 139 mEq/L (ref 135–145)

## 2010-08-02 ENCOUNTER — Other Ambulatory Visit (HOSPITAL_COMMUNITY): Payer: 59

## 2010-08-03 NOTE — Op Note (Signed)
NAME:  Lauren Espinoza, Lauren Espinoza                   ACCOUNT NO.:  [id number]  MEDICAL RECORD NO.:  [id number]           PATIENT TYPE:  I  LOCATION:  9373                          FACILITY:  [facility]  PHYSICIAN:  Carrington Clamp, M.D. DATE OF BIRTH:  [birth year]  DATE OF PROCEDURE:  07/27/2010 DATE OF DISCHARGE:                              OPERATIVE REPORT   PREOPERATIVE DIAGNOSES:  Superimposed severe worsening preeclampsia, preterm at 25-2/7th weeks with labile blood pressures, headache and massive swelling.  POSTOPERATIVE DIAGNOSES:  Superimposed severe worsening preeclampsia, preterm at 25-2/7th weeks with labile blood pressures, headache and massive swelling.  PROCEDURE:  Low-transverse cesarean section, although incision is probably too high to allow for laboring in the future.  SURGEON:  Carrington Clamp, MD  ASSISTANT:  Gerrit Friends. Aldona Bar, MD  ANESTHESIA:  Spinal.  FINDINGS:  Female infant, vertex presentation, Apgars (not available at time of op), weight 1#9.  Normal tubes, uterus and ovaries seen.  There were some small fibroids on the external serosa of the uterus.  Cord pH of 7.31.  SPECIMENS:  Placenta to pathology.  BLOOD LOSS:  800 mL.  IV FLUIDS:  300 mL.  URINE OUTPUT:  15 mL.  COMPLICATIONS:  None.  MEDICATIONS:  Ancef and Pitocin.  Counts were correct x3.  REASON FOR OPERATION:  Ms. Lauren Espinoza is a 40 year old G1, P0 who was admitted on Jul 22, 2010 for worsening severe superimposed preeclampsia. At that point in time when she was admitted, she had 2.6 grams of protein in her urine over 24 hours.  The patient's blood pressures had been worsening and she had been started on condominant labetalol as well as Aldomet.  The patient did not have any signs or symptoms of HELLP syndrome at that time.  The patient was given two doses of betamethasone steroids and echocardiogram of the baby was done to ensure fetal heart details, but these were not able to be obtained secondary to  size of the baby and maternal habitus.  On hospital day #5, the patient's 24-hour urine came back spilling 10 grams of protein.  The patient was started on magnesium sulfate and a PICC line was placed for IV access.  This morning, the patient's blood pressures were again increasingly more labile running anywhere from 150/80 to 170/110.  The patient was counseled that if we were to way on delivery, she was supposed to be transferred to Maternal Fetal Service at [facility] for careful monitoring as she had definitely severe superimposed preeclampsia.  The patient felt strongly about not being transferred and not waiting any longer because she felt that the risks to her were far waiting the small benefit that might be achieved by waiting for delivery of the baby for 2 days.  I agree with her that the increased risk of seizures, bleeding or having a cesarean section with the possibility of damage to internal organs and/or problems with bleeding were much higher than the small benefit of waiting an additional 1-2 days.  Because the patient could not be adequately monitored in utero, the discussion was then undertaken to proceed with a cesarean  section as opposed to inducing labor.  I discussed with the patient the possibility of classical C-section versus transverse section and she understood this even if she has transverse section, we may feel that it was too high on the uterus to allow for labor in the future.  The patient understood everything and was willing to proceed.  TECHNIQUE:  After adequate spinal anesthesia was achieved, the patient was prepped and draped in sterile fashion, dorsal supine position with the leftward tilt.  A Pfannenstiel skin incisions were made with the scalpel carried down through the fascia with the Bovie cautery.  The fascia was incised in the midline and then incised in a transverse curvilinear manner.  The fascia was reflected superiorly and inferiorly from  the rectus muscles.  The rectus muscle was split in the midline. The bowel free portion of peritoneum was then entered into bluntly and the peritoneum extended in superior-inferior manner with good visualization of the bowel and the bladder.  The Alexis instrument was placed and the vesicouterine fascia reflection was identified.  Just above this area, there was enough of lower uterine segment to be able to do a transverse incision.  I knew that this would probably be too high on the uterus to be a true low-transverse cesarean section.  However, the transverse incision was able to be made just on the upper portion of the lower uterine segment.  The placenta was identified on entry into the amnion and it was able to be pushed out of the way once the uterine incision had been extended manually.  The baby was identified in the vertex presentation and the arm was reduced back into the uterus to allow for the baby to be brought through the incision.  Once the baby was delivered, the cord was clamped and cut and baby handed to awaiting pediatrics.  Baby was moving quite vigorously on delivery and the neonatologist had intubated the patient in order to be able to give surfactant.  The placenta was then delivered manually after cord bloods and gases had been obtained.  The uterus was cleared of all debris.  The uterine incision was then closed with a running lock stitch of 0 Monocryl.  An additional running lock stitch of 0 Monocryl was performed and a third imbricating layer was performed as well.  Once the uterine incision was close, the ovaries and tubes were inspected and hemostasis was achieved. The irrigation was performed and all instruments were drawn from the abdomen and the peritoneum was closed with a running stitch of 2-0 Vicryl.  This incorporated the rectus muscles as a separate layer.  The fascia was then closed with a running stitch of 0 Vicryl.  This subcutaneous tissue was  rendered hemostatic with Bovie cautery irrigation.  The subcutaneous tissue was then closed with multiple single stitches of 2-0 plain gut.  The skin was closed with staples. The patient tolerated the procedure well and was returned to the recovery in stable condition.  The patient will go to [facility] for continued magnesium administration.    Carrington Clamp, M.D.    MH/MEDQ  D:  07/27/2010  T:  07/28/2010  Job:  [id number]  Electronically Signed by Carrington Clamp MD on 08/03/2010 03:08:20 PM

## 2010-08-05 ENCOUNTER — Other Ambulatory Visit (HOSPITAL_COMMUNITY): Payer: 59

## 2010-08-05 NOTE — Op Note (Signed)
[facility]  Patient:    Lauren Espinoza, Lauren Espinoza                        MRN: [id number] Proc. Date: 02/29/00 Adm. Date:  [id number] Attending:  Kandis Mannan CC:         Vale Haven. Andrey Campanile, M.D.   Operative Report  CCS #:  [id number]  PREOPERATIVE DIAGNOSES:  Chronic cholecystitis with cholelithiasis.  POSTOPERATIVE DIAGNOSES:  Chronic cholecystitis with cholelithiasis.  OPERATION PERFORMED:  Laparoscopic cholecystectomy.  SURGEON:  Dr. Rozetta Nunnery.  ASSISTANT:  Dr. Ginette Pitman.  ANESTHESIA:  General, CRNA.  DESCRIPTION OF PROCEDURE:  The patient was taken to the operating room, placed on the table in supine position. After satisfactory general anesthetic with intubation, the entire abdomen was prepped and draped as a sterile field. A small vertical infraumbilical incision was made through the skin and subcutaneous tissue. The patient had probably 6 cm of fat before we reached the fascia. The midline fascia was divided vertically and a finger was placed into the peritoneal cavity. A pursestring suture of #0 Vicryl was placed and the Hasson trocar placed into the abdomen and the abdomen insufflated to 14 mmHg pressure. A second 10 mm trocar was placed just to the right of midline in subxiphoid area. Two 5 mm trocars were placed laterally in the right abdomen. The gallbladder had considerable very dense adhesions to the lower pole of the gallbladder. These were taken down with gentle dissection. The cystic duct was identified and was normal size. The cystic duct was triply clipped on the remaining side, once one gallbladder side and divided. We then saw the cystic artery which was doubly clipped on the remaining side and once on the gallbladder side and divided. The gallbladder was dissected from the bed using the cautery. There was another small vessel which was clipped. After the gallbladder had been freed from the gallbladder bed, the bed was irrigated.  Hemostasis was obtained but also a small piece of Surgicel was left in the gallbladder fossa. The gallbladder was then easily delivered through the infraumbilical incision by palpation. The patient had 1 stone about the size of 1 cm. The fluid which had been used for irrigation was suctioned and the 2 lateral trocars were removed under direct vision. The pursestring suture at the infraumbilical incision was tied and this gave good closure of the fascia. A 0.25% Marcaine with epinephrine had been injected in all trocar sites. The 2 midline incisions were closed with running subcuticular sutures of 4-0 Vicryl and the 2 lateral trocar sites were closed with single simple 4-0 Vicryl sutures. Benzoin and 1/4 inch Steri-Strips were used to reinforce the skin closure. A dry sterile dressing was applied. The patient seemed to tolerate the procedure well and was taken to the PACU in satisfactory condition. DD:  02/29/00 TD:  02/29/00 Job: [id number]

## 2010-08-06 NOTE — Discharge Summary (Signed)
NAME:  Lauren Espinoza, Lauren Espinoza                   ACCOUNT NO.:  [id number]  MEDICAL RECORD NO.:  [id number]           PATIENT TYPE:  I  LOCATION:  9310                          FACILITY:  [facility]  PHYSICIAN:  Malva Limes, M.D.    DATE OF BIRTH:  12-Feb-1971  DATE OF ADMISSION:  07/22/2010 DATE OF DISCHARGE:  07/30/2010                              DISCHARGE SUMMARY   PRINCIPAL DISCHARGE DIAGNOSES: 1. Intrauterine pregnancy at 87 weeks' estimated gestational age. 2. Chronic hypertension with severe superimposed preeclampsia.  PRINCIPAL PROCEDURE:  Primary low transverse cesarean section.  HISTORY OF PRESENT ILLNESS:  Lauren Espinoza is a 40 year old white female G1, P0 who was admitted on Jul 22, 2010, by Dr. Carrington Clamp.  The patient was admitted for chronic hypertension with worsening severe preeclampsia.  At the time of admission, she did have mild headaches. She had no visual changes or right upper quadrant pain.  HOSPITAL COURSE:  The patient was admitted and monitored.  She was given betamethasone to mature the fetal lungs.  She was also continued on her phenobarbital as she had a seizure history.  The patient was managed with labetalol 200 mg b.i.d. and Aldomet 500 mg b.i.d.  Her PIH labs remained normal, however, it was becoming more and more difficult to control her blood pressure.  The patient's 24-hour urine also significantly changed.  Her initial 24-hour urine revealed 1 g of protein in 24 hours and on hospital day #2 she had 10 g of protein in her 24-hour urine.  On Jul 27, 2010, the patient's blood pressure was becoming more and more difficult to control and after discussion with the Maternal Fetal Medicine and the patient, it was decided to proceed with a primary low transverse cesarean section for chronic hypertension with severe superimposed preeclampsia.  The patient had a C-section performed by Dr. Henderson Cloud.  She delivered 1 live viable female infant which was transferred to the  Intensive Care Unit.  The cord pH was 7.31. A complete description of this procedure can be found in dictated operative note.  The patient was transferred to the Adult Intensive Care Unit postoperatively.  She began to improve quickly.  On postop day #1, she was transferred from the Intensive Care Unit to the floor.  During the postop, she diuresed well after she was given Lasix.  At the time of discharge, the patient's blood pressures were maintained in the 140-160 range over 80-105.  At the time of discharge, she was ambulating without difficulty.  She had no headaches.  She was eating a regular diet.  She was afebrile.  The patient was discharged to home on labetalol 400 mg p.o. b.i.d., also Lasix 20 mg p.o. q.a.m., and Percocet 5 mg q.4 h. p.r.n.  The patient was told to follow up in the office in 1-2 weeks and to call with any shortness of breath or headaches.          ______________________________ Malva Limes, M.D.    MA/MEDQ  D:  07/30/2010  T:  07/30/2010  Job:  [id number]  Electronically Signed by Malva Limes M.D. on 08/06/2010 04:00:26  PM

## 2010-09-02 ENCOUNTER — Other Ambulatory Visit: Payer: Self-pay | Admitting: Obstetrics and Gynecology

## 2010-10-24 ENCOUNTER — Telehealth: Payer: Self-pay | Admitting: Family Medicine

## 2010-10-24 DIAGNOSIS — G40909 Epilepsy, unspecified, not intractable, without status epilepticus: Secondary | ICD-10-CM

## 2010-10-24 MED ORDER — PHENOBARBITAL 100 MG PO TABS: 100.0000 mg | ORAL_TABLET | Freq: Two times a day (BID) | ORAL | Status: DC

## 2010-10-24 NOTE — Telephone Encounter (Signed)
Refill request for Phenobarbital 100 mg, pt last here on 04/22/10 and script last filled on 09/20/10.

## 2010-10-24 NOTE — Telephone Encounter (Signed)
Script called in

## 2010-10-24 NOTE — Telephone Encounter (Signed)
She takes this bid. Call in #[phone] with 5 rf

## 2010-10-25 ENCOUNTER — Telehealth: Payer: Self-pay | Admitting: Family Medicine

## 2010-10-25 NOTE — Telephone Encounter (Signed)
Pharmacy stated the the Phenobarbital does not come in 100 mg, gave pt 97.2 mg dose. Okay per Dr. Clent Ridges.

## 2010-11-08 ENCOUNTER — Inpatient Hospital Stay (HOSPITAL_COMMUNITY): Admission: AD | Admit: 2010-11-08 | Payer: 59 | Source: Ambulatory Visit | Admitting: Obstetrics and Gynecology

## 2011-03-17 ENCOUNTER — Telehealth: Payer: Self-pay | Admitting: Family Medicine

## 2011-03-17 ENCOUNTER — Other Ambulatory Visit: Payer: Self-pay | Admitting: Family Medicine

## 2011-03-17 MED ORDER — LISINOPRIL 20 MG PO TABS: 20.0000 mg | ORAL_TABLET | Freq: Every day | ORAL | Status: DC

## 2011-03-17 NOTE — Telephone Encounter (Signed)
Pt called back and she just needs enough of the medication to last until her office visit. I sent in script e-scribe, enough to last until pt comes in for her visit and she is aware of this.

## 2011-03-17 NOTE — Telephone Encounter (Signed)
Pt is coming in for a cpe on 05/01/11 but needs a refill on lisinopril (PRINIVIL,ZESTRIL) 20 MG tablet. Pt has received this from the doctor in the past but during her pregnancy she was getting it from her obgyn. Pt is out of meds and is requesting a refill.[facility] already sent a refill request e-scribe to [place].

## 2011-03-19 ENCOUNTER — Emergency Department (INDEPENDENT_AMBULATORY_CARE_PROVIDER_SITE_OTHER)
Admission: EM | Admit: 2011-03-19 | Discharge: 2011-03-19 | Disposition: A | Discharge status: Home / Self Care | Payer: 59 | Source: Home / Self Care | Attending: Emergency Medicine | Admitting: Emergency Medicine

## 2011-03-19 ENCOUNTER — Encounter (HOSPITAL_COMMUNITY): Payer: Self-pay | Admitting: *Deleted

## 2011-03-19 DIAGNOSIS — J029 Acute pharyngitis, unspecified: Secondary | ICD-10-CM

## 2011-03-19 DIAGNOSIS — H6120 Impacted cerumen, unspecified ear: Secondary | ICD-10-CM

## 2011-03-19 MED ORDER — PENICILLIN V POTASSIUM 500 MG PO TABS: 500.0000 mg | ORAL_TABLET | Freq: Three times a day (TID) | ORAL | Status: AC

## 2011-03-19 MED ORDER — FLUCONAZOLE 150 MG PO TABS: 150.0000 mg | ORAL_TABLET | Freq: Once | ORAL | Status: DC

## 2011-03-19 NOTE — ED Notes (Signed)
Pt with c/o sorethroat and bilateral ear pain x 2 days

## 2011-03-19 NOTE — ED Provider Notes (Signed)
History     CSN: [id number]  Arrival date & time 03/19/11  0919   First MD Initiated Contact with Patient 03/19/11 0920      Chief Complaint  Patient presents with  . Sore Throat  . Otalgia    (Consider location/radiation/quality/duration/timing/severity/associated sxs/prior treatment) HPI Comments: Lauren Espinoza is a 40 year old female who has had a two-day history of sore throat, aching all over, bilateral earache, and swollen, tender glands. She has been exposed to strep at has a history of strep in the past. She denies any fever, chills, sweats, headache, nasal congestion, rhinorrhea cough, abdominal pain, nausea, or vomiting.  Patient is a 40 y.o. female presenting with pharyngitis and ear pain.  Sore Throat Pertinent negatives include no abdominal pain and no shortness of breath.  Otalgia Associated symptoms include sore throat. Pertinent negatives include no rhinorrhea, no abdominal pain, no diarrhea, no vomiting, no cough and no rash.    Past Medical History  Diagnosis Date  . HYPERTENSION 04/20/2008  . BACK PAIN, UPPER 09/03/2008  . SEIZURE DISORDER 04/20/2008  . DVT, HX OF 04/20/2008  . Edema   . Left shoulder pain     sees Dr Chaney Malling    Past Surgical History  Procedure Date  . Cholecystectomy   . Cesarean section     History reviewed. No pertinent family history.  History  Substance Use Topics  . Smoking status: Never Smoker   . Smokeless tobacco: Not on file  . Alcohol Use: No    OB History    Grav Para Term Preterm Abortions TAB SAB Ect Mult Living   1               Review of Systems  Constitutional: Negative for fever, chills and fatigue.  HENT: Positive for ear pain and sore throat. Negative for congestion, rhinorrhea, sneezing, neck stiffness, voice change and postnasal drip.   Eyes: Negative for pain, discharge and redness.  Respiratory: Negative for cough, chest tightness, shortness of breath and wheezing.   Gastrointestinal: Negative for nausea,  vomiting, abdominal pain and diarrhea.  Skin: Negative for rash.    Allergies  Review of patient's allergies indicates no known allergies.  Home Medications   Current Outpatient Rx  Name Route Sig Dispense Refill  . ACETAMINOPHEN 325 MG PO TABS Oral Take 650 mg by mouth every 6 (six) hours as needed.      Marland Kitchen DAY TIME COUGH PO Oral Take by mouth.      Marland Kitchen HYDROCHLOROTHIAZIDE 25 MG PO TABS Oral Take 25 mg by mouth daily.      Marland Kitchen LISINOPRIL 20 MG PO TABS Oral Take 1 tablet (20 mg total) by mouth daily. 30 tablet 1  . DIAZEPAM 5 MG PO TABS Oral Take 5 mg by mouth every 8 (eight) hours as needed.      Marland Kitchen FLUCONAZOLE 150 MG PO TABS Oral Take 1 tablet (150 mg total) by mouth once. 1 tablet 0  . FOLIC ACID 1 MG PO TABS Oral Take 1 mg by mouth. 2 tabs in AM, 2 tabs in PM     . FOLIC ACID 400 MCG PO TABS Oral Take 400 mcg by mouth daily.     . METHYLDOPA 250 MG PO TABS Oral Take 1 tablet (250 mg total) by mouth 2 (two) times daily. 60 tablet 5  . PENICILLIN V POTASSIUM 500 MG PO TABS Oral Take 1 tablet (500 mg total) by mouth 3 (three) times daily. 30 tablet 0  . PHENOBARBITAL 100  MG PO TABS Oral Take 1 tablet (100 mg total) by mouth 2 (two) times daily. 60 tablet 5  . PRENATAL RX 60-1 MG PO TABS Oral Take 1 tablet by mouth daily.        BP 117/80  Pulse 89  Temp(Src) 98.8 F (37.1 C) (Oral)  Resp 18  SpO2 98%  LMP 03/06/2011  Breastfeeding? Unknown  Physical Exam  Nursing note and vitals reviewed. Constitutional: She appears well-developed and well-nourished. No distress.  HENT:  Head: Normocephalic and atraumatic.  Right Ear: External ear normal.  Left Ear: External ear normal.  Nose: Nose normal.  Mouth/Throat: Oropharynx is clear and moist. No oropharyngeal exudate.       There were cerumen impactions in both ear canals. After this was irrigated clear, canals and TMs appear normal. The pharynx was erythematous and swollen without exudate. Anterior cervical nodes were tender but not  enlarged.  Eyes: Conjunctivae and EOM are normal. Pupils are equal, round, and reactive to light. Right eye exhibits no discharge. Left eye exhibits no discharge.  Neck: Normal range of motion. Neck supple.  Cardiovascular: Normal rate, regular rhythm and normal heart sounds.   Pulmonary/Chest: Effort normal and breath sounds normal. No stridor. No respiratory distress. She has no wheezes. She has no rales. She exhibits no tenderness.  Lymphadenopathy:    She has no cervical adenopathy.  Skin: Skin is warm and dry. No rash noted. She is not diaphoretic.    ED Course  Procedures (including critical care time)   Labs Reviewed  POCT RAPID STREP A ([facility]RG CARE ONLY)   No results found.   1. Pharyngitis   2. Impacted cerumen       MDM  Her quick strep test was negative, however her history is suggestive of strep, and she has a 25 week preemie at home. We'll therefore go ahead and treat with a course of penicillin V.        Roque Lias, MD 03/19/11 [id number]

## 2011-04-21 ENCOUNTER — Telehealth: Payer: Self-pay | Admitting: Family Medicine

## 2011-04-21 DIAGNOSIS — G40909 Epilepsy, unspecified, not intractable, without status epilepticus: Secondary | ICD-10-CM

## 2011-04-21 MED ORDER — PHENOBARBITAL 100 MG PO TABS: 100.0000 mg | ORAL_TABLET | Freq: Two times a day (BID) | ORAL | Status: DC

## 2011-04-21 MED ORDER — PHENOBARBITAL 97.2 MG PO TABS: 97.2000 mg | ORAL_TABLET | Freq: Two times a day (BID) | ORAL | Status: DC

## 2011-04-21 NOTE — Telephone Encounter (Signed)
I called in script to [facility] and the dose was changed because the pharmacy cannot get the 100 mg, per Dr. Clent Ridges.

## 2011-04-21 NOTE — Telephone Encounter (Addendum)
Lauren Espinoza please call med into [facility] [phone]

## 2011-04-21 NOTE — Telephone Encounter (Signed)
Script called in

## 2011-04-21 NOTE — Telephone Encounter (Signed)
Spoke with pt, script called in and she does have a office visit scheduled for here.

## 2011-04-21 NOTE — Telephone Encounter (Signed)
Pt called and is checking on status of getting the PHENObarbital (LUMINAL) 100 MG tablet, called in to [facility]. [phone]

## 2011-04-24 ENCOUNTER — Other Ambulatory Visit (INDEPENDENT_AMBULATORY_CARE_PROVIDER_SITE_OTHER): Payer: 59

## 2011-04-24 DIAGNOSIS — Z0000 Encounter for general adult medical examination without abnormal findings: Secondary | ICD-10-CM

## 2011-04-24 LAB — HEPATIC FUNCTION PANEL
ALT: 18 U/L (ref 0–35)
AST: 15 U/L (ref 0–37)
Alkaline Phosphatase: 39 U/L (ref 39–117)
Bilirubin, Direct: 0 mg/dL (ref 0.0–0.3)
Total Bilirubin: 0.5 mg/dL (ref 0.3–1.2)

## 2011-04-24 LAB — BASIC METABOLIC PANEL
BUN: 12 mg/dL (ref 6–23)
Calcium: 9 mg/dL (ref 8.4–10.5)
Creatinine, Ser: 0.7 mg/dL (ref 0.4–1.2)
GFR: 108.35 mL/min (ref >60.00)
Potassium: 4 mEq/L (ref 3.5–5.1)

## 2011-04-24 LAB — CBC WITH DIFFERENTIAL/PLATELET
Eosinophils Relative: 0.9 % (ref 0.0–5.0)
HCT: 41 % (ref 36.0–46.0)
Lymphocytes Relative: 25.9 % (ref 12.0–46.0)
Monocytes Relative: 7.5 % (ref 3.0–12.0)
Neutrophils Relative %: 65.1 % (ref 43.0–77.0)
Platelets: 295 10*3/uL (ref 150.0–400.0)
WBC: 7.2 10*3/uL (ref 4.5–10.5)

## 2011-04-24 LAB — POCT URINALYSIS DIPSTICK
Bilirubin, UA: NEGATIVE
Blood, UA: NEGATIVE
Glucose, UA: NEGATIVE
Ketones, UA: NEGATIVE
Leukocytes, UA: NEGATIVE
Nitrite, UA: NEGATIVE
Protein, UA: NEGATIVE
Spec Grav, UA: 1.02
Urobilinogen, UA: 0.2
pH, UA: 5.5

## 2011-04-24 LAB — LIPID PANEL
HDL: 48.2 mg/dL (ref >39.00)
Total CHOL/HDL Ratio: 5
VLDL: 27.8 mg/dL (ref 0.0–40.0)

## 2011-04-24 LAB — LDL CHOLESTEROL, DIRECT: Direct LDL: 156.5 mg/dL

## 2011-04-24 LAB — TSH: TSH: 2.49 u[IU]/mL (ref 0.35–5.50)

## 2011-04-27 NOTE — Progress Notes (Signed)
Quick Note:  Left voice message ______ 

## 2011-05-01 ENCOUNTER — Encounter: Payer: Self-pay | Admitting: Family Medicine

## 2011-05-01 ENCOUNTER — Ambulatory Visit (INDEPENDENT_AMBULATORY_CARE_PROVIDER_SITE_OTHER): Payer: 59 | Admitting: Family Medicine

## 2011-05-01 VITALS — BP 128/76 | HR 121 | Temp 98.6°F | Ht 70.0 in

## 2011-05-01 DIAGNOSIS — Z0000 Encounter for general adult medical examination without abnormal findings: Secondary | ICD-10-CM

## 2011-05-01 MED ORDER — ATORVASTATIN CALCIUM 20 MG PO TABS: 20.0000 mg | ORAL_TABLET | Freq: Every day | ORAL | Status: DC

## 2011-05-01 MED ORDER — PHENOBARBITAL 97.2 MG PO TABS: 97.2000 mg | ORAL_TABLET | Freq: Two times a day (BID) | ORAL | Status: DC

## 2011-05-01 MED ORDER — LISINOPRIL 20 MG PO TABS: 20.0000 mg | ORAL_TABLET | Freq: Every day | ORAL | Status: DC

## 2011-05-01 MED ORDER — METFORMIN HCL 500 MG PO TABS: 500.0000 mg | ORAL_TABLET | Freq: Two times a day (BID) | ORAL | Status: DC

## 2011-05-01 MED ORDER — HYDROCHLOROTHIAZIDE 25 MG PO TABS: 25.0000 mg | ORAL_TABLET | Freq: Every day | ORAL | Status: DC

## 2011-05-01 NOTE — Progress Notes (Signed)
  Subjective:    Patient ID: Lauren Espinoza, female    DOB: May 12, 1970, 41 y.o.   MRN: [id number]  HPI 41 yr old female for a cpx. She feels fine and has no concerns. Her recent labs were remarkable for high cholesterol and a fasting glucose of 133. She has a 2 month old baby at home, and she is doing well now after a rocky start. Donna had pre-eclampsia during her pregnancy with elevated BPs, proteinuria, and edema. She also had high blood glucoses but was not given insulin for these. She was delivered by C section at 25 weeks of gestation. The infant was transferred to [facility] and stayed in the NICU there for almost 3 months. Now her baby is up to 15 lbs and feeding well on formula.    Review of Systems  Constitutional: Negative.   HENT: Negative.   Eyes: Negative.   Respiratory: Negative.   Cardiovascular: Negative.   Gastrointestinal: Negative.   Genitourinary: Negative for dysuria, urgency, frequency, hematuria, flank pain, decreased urine volume, enuresis, difficulty urinating, pelvic pain and dyspareunia.  Musculoskeletal: Negative.   Skin: Negative.   Neurological: Negative.   Hematological: Negative.   Psychiatric/Behavioral: Negative.        Objective:   Physical Exam  Constitutional: She is oriented to person, place, and time. No distress.       Morbidly obese   HENT:  Head: Normocephalic and atraumatic.  Right Ear: External ear normal.  Left Ear: External ear normal.  Nose: Nose normal.  Mouth/Throat: Oropharynx is clear and moist. No oropharyngeal exudate.  Eyes: Conjunctivae and EOM are normal. Pupils are equal, round, and reactive to light. No scleral icterus.  Neck: Normal range of motion. Neck supple. No JVD present. No thyromegaly present.  Cardiovascular: Normal rate, regular rhythm, normal heart sounds and intact distal pulses.  Exam reveals no gallop and no friction rub.   No murmur heard. Pulmonary/Chest: Effort normal and breath sounds normal.  No respiratory distress. She has no wheezes. She has no rales. She exhibits no tenderness.  Abdominal: Soft. Bowel sounds are normal. She exhibits no distension and no mass. There is no tenderness. There is no rebound and no guarding.  Musculoskeletal: Normal range of motion. She exhibits no edema and no tenderness.  Lymphadenopathy:    She has no cervical adenopathy.  Neurological: She is alert and oriented to person, place, and time. She has normal reflexes. No cranial nerve deficit. She exhibits normal muscle tone. Coordination normal.  Skin: Skin is warm and dry. No rash noted. No erythema.  Psychiatric: She has a normal mood and affect. Her behavior is normal. Judgment and thought content normal.          Assessment & Plan:  Well exam. We discussed diet and exercise, and she knows she needs to lose weight. Start on Lipitor and Metformin. We will see her back in one month

## 2011-05-15 ENCOUNTER — Ambulatory Visit (INDEPENDENT_AMBULATORY_CARE_PROVIDER_SITE_OTHER): Payer: 59 | Admitting: Family Medicine

## 2011-05-15 ENCOUNTER — Encounter: Payer: Self-pay | Admitting: Family Medicine

## 2011-05-15 VITALS — BP 118/76 | HR 113 | Temp 98.5°F

## 2011-05-15 DIAGNOSIS — E119 Type 2 diabetes mellitus without complications: Secondary | ICD-10-CM

## 2011-05-15 DIAGNOSIS — I10 Essential (primary) hypertension: Secondary | ICD-10-CM

## 2011-05-15 DIAGNOSIS — E785 Hyperlipidemia, unspecified: Secondary | ICD-10-CM

## 2011-05-15 NOTE — Progress Notes (Signed)
  Subjective:    Patient ID: Lauren Espinoza, female    DOB: [birth year], 41 y.o.   MRN: [id number]  HPI Here to follow up a recent diagnosis of type 2 diabetes. We started her on Metformin 2 weeks ago, and she has done very well. She has lost 4 lbs, and her glucoses at home have ranged from 99 to 130 for the most part.    Review of Systems  Constitutional: Negative.   Respiratory: Negative.   Cardiovascular: Negative.        Objective:   Physical Exam  Constitutional: She appears well-developed and well-nourished.  Cardiovascular: Normal rate, regular rhythm, normal heart sounds and intact distal pulses.   Pulmonary/Chest: Effort normal and breath sounds normal.          Assessment & Plan:  She is doing well. Recheck with labs in 90 days

## 2011-05-30 ENCOUNTER — Encounter: Payer: Self-pay | Admitting: Family Medicine

## 2011-05-30 ENCOUNTER — Ambulatory Visit (INDEPENDENT_AMBULATORY_CARE_PROVIDER_SITE_OTHER): Payer: 59 | Admitting: Family Medicine

## 2011-05-30 VITALS — BP 120/86 | HR 110 | Temp 98.6°F

## 2011-05-30 DIAGNOSIS — R109 Unspecified abdominal pain: Secondary | ICD-10-CM

## 2011-05-30 MED ORDER — DIAZEPAM 5 MG PO TABS: 5.0000 mg | ORAL_TABLET | Freq: Two times a day (BID) | ORAL | Status: DC | PRN

## 2011-05-30 NOTE — Progress Notes (Signed)
  Subjective:    Patient ID: Lauren Espinoza, female    DOB: [birth year], 41 y.o.   MRN: [id number]  HPI Here for pains that started suddenly 2 days ago in the right side. No trauma, but she does carry her infant child in a heavy carrier with the right hand most of the time. She put heat on it and took a Valium before she went to bed. Today it feels better.    Review of Systems  Constitutional: Negative.   Respiratory: Negative.   Cardiovascular: Negative.   Gastrointestinal: Negative.   Genitourinary: Negative.        Objective:   Physical Exam  Constitutional: She appears well-developed and well-nourished.  Pulmonary/Chest: Effort normal and breath sounds normal. She exhibits no tenderness.  Abdominal: Soft. Bowel sounds are normal. She exhibits no distension and no mass. There is no tenderness. There is no rebound and no guarding.  Musculoskeletal: Normal range of motion. She exhibits no edema and no tenderness.          Assessment & Plan:  Apparent muscle spasms which have resolved. Recheck prn. Try to alternate which hand she uses to carry her child around with

## 2011-07-19 ENCOUNTER — Other Ambulatory Visit: Payer: Self-pay | Admitting: Obstetrics and Gynecology

## 2011-07-24 ENCOUNTER — Encounter (HOSPITAL_COMMUNITY): Payer: Self-pay | Admitting: Pharmacist

## 2011-08-04 ENCOUNTER — Encounter (HOSPITAL_COMMUNITY)
Admission: RE | Admit: 2011-08-04 | Discharge: 2011-08-04 | Disposition: A | Discharge status: Home / Self Care | Payer: 59 | Source: Ambulatory Visit | Attending: Obstetrics and Gynecology | Admitting: Obstetrics and Gynecology

## 2011-08-04 ENCOUNTER — Encounter (HOSPITAL_COMMUNITY): Payer: Self-pay

## 2011-08-04 LAB — CBC
MCH: 29.9 pg (ref 26.0–34.0)
MCHC: 33 g/dL (ref 30.0–36.0)
MCV: 90.6 fL (ref 78.0–100.0)
Platelets: 321 10*3/uL (ref 150–400)
RDW: 12.4 % (ref 11.5–15.5)

## 2011-08-04 LAB — BASIC METABOLIC PANEL
CO2: 28 mEq/L (ref 19–32)
Calcium: 9.2 mg/dL (ref 8.4–10.5)
Creatinine, Ser: 0.65 mg/dL (ref 0.50–1.10)
GFR calc Af Amer: >90 mL/min (ref >90)
GFR calc non Af Amer: >90 mL/min (ref >90)
Sodium: 136 mEq/L (ref 135–145)

## 2011-08-04 NOTE — Patient Instructions (Addendum)
20 Lauren Espinoza  08/04/2011   Your procedure is scheduled on:  5/22  Enter through the Main Entrance of [facility] at 6 AM.  Pick up the phone at the desk and dial [phone].   Call this number if you have problems the morning of surgery: [phone]   Remember:   Do not eat food:After Midnight.  Do not drink clear liquids: After Midnight.  Take these medicines the morning of surgery with A SIP OF WATER: take blood pressure medication, hold Metformin for 24hrs prior to surgery   Do not wear jewelry, make-up or nail polish.  Do not wear lotions, powders, or perfumes. You may wear deodorant.  Do not shave 48 hours prior to surgery.  Do not bring valuables to the hospital.  Contacts, dentures or bridgework may not be worn into surgery.  Leave suitcase in the car. After surgery it may be brought to your room.  For patients admitted to the hospital, checkout time is 11:00 AM the day of discharge.   Patients discharged the day of surgery will not be allowed to drive home.  Name and phone number of your driver: Husband  [name]  Special Instructions: CHG Shower Use Special Wash: 1/2 bottle night before surgery and 1/2 bottle morning of surgery.   Please read over the following fact sheets that you were given: Surgical Site Infection Prevention

## 2011-08-09 ENCOUNTER — Ambulatory Visit (HOSPITAL_COMMUNITY)
Admission: RE | Admit: 2011-08-09 | Discharge: 2011-08-09 | Disposition: A | Discharge status: Home / Self Care | Payer: 59 | Source: Ambulatory Visit | Attending: Obstetrics and Gynecology | Admitting: Obstetrics and Gynecology

## 2011-08-09 ENCOUNTER — Encounter (HOSPITAL_COMMUNITY): Payer: Self-pay | Admitting: Anesthesiology

## 2011-08-09 ENCOUNTER — Encounter (HOSPITAL_COMMUNITY): Payer: Self-pay | Admitting: *Deleted

## 2011-08-09 ENCOUNTER — Encounter (HOSPITAL_COMMUNITY)
Admission: RE | Disposition: A | Discharge status: Home / Self Care | Payer: Self-pay | Source: Ambulatory Visit | Attending: Obstetrics and Gynecology

## 2011-08-09 ENCOUNTER — Ambulatory Visit (HOSPITAL_COMMUNITY): Payer: 59 | Admitting: Anesthesiology

## 2011-08-09 DIAGNOSIS — Z01818 Encounter for other preprocedural examination: Secondary | ICD-10-CM | POA: Insufficient documentation

## 2011-08-09 DIAGNOSIS — Z9889 Other specified postprocedural states: Secondary | ICD-10-CM

## 2011-08-09 DIAGNOSIS — N920 Excessive and frequent menstruation with regular cycle: Secondary | ICD-10-CM | POA: Insufficient documentation

## 2011-08-09 DIAGNOSIS — Z01812 Encounter for preprocedural laboratory examination: Secondary | ICD-10-CM | POA: Insufficient documentation

## 2011-08-09 HISTORY — PX: ENDOMETRIAL ABLATION: SHX621

## 2011-08-09 LAB — PREGNANCY, URINE: Preg Test, Ur: NEGATIVE

## 2011-08-09 LAB — GLUCOSE, CAPILLARY: Glucose-Capillary: 132 mg/dL — ABNORMAL HIGH (ref 70–99)

## 2011-08-09 SURGERY — DILATATION & CURETTAGE/HYSTEROSCOPY WITH NOVASURE ABLATION
Anesthesia: General

## 2011-08-09 MED ORDER — LIDOCAINE HCL (CARDIAC) 20 MG/ML IV SOLN
INTRAVENOUS | Status: AC
  Filled 2011-08-09: qty 5

## 2011-08-09 MED ORDER — PROPOFOL 10 MG/ML IV EMUL
INTRAVENOUS | Status: DC | PRN
  Administered 2011-08-09: 20 mg via INTRAVENOUS
  Administered 2011-08-09: 180 mg via INTRAVENOUS
  Administered 2011-08-09: 20 mg via INTRAVENOUS

## 2011-08-09 MED ORDER — ONDANSETRON HCL 4 MG/2ML IJ SOLN
INTRAMUSCULAR | Status: DC | PRN
  Administered 2011-08-09: 4 mg via INTRAVENOUS

## 2011-08-09 MED ORDER — ONDANSETRON HCL 4 MG/2ML IJ SOLN
INTRAMUSCULAR | Status: AC
  Filled 2011-08-09: qty 2

## 2011-08-09 MED ORDER — FENTANYL CITRATE 0.05 MG/ML IJ SOLN
INTRAMUSCULAR | Status: AC
  Filled 2011-08-09: qty 5

## 2011-08-09 MED ORDER — PROPOFOL 10 MG/ML IV EMUL
INTRAVENOUS | Status: AC
  Filled 2011-08-09: qty 20

## 2011-08-09 MED ORDER — LACTATED RINGERS IV SOLN
INTRAVENOUS | Status: DC
  Administered 2011-08-09 (×2): via INTRAVENOUS

## 2011-08-09 MED ORDER — MIDAZOLAM HCL 2 MG/2ML IJ SOLN
INTRAMUSCULAR | Status: AC
  Filled 2011-08-09: qty 2

## 2011-08-09 MED ORDER — LACTATED RINGERS IR SOLN
Status: DC | PRN
  Administered 2011-08-09: 3000 mL

## 2011-08-09 MED ORDER — FENTANYL CITRATE 0.05 MG/ML IJ SOLN: 25.0000 ug | INTRAMUSCULAR | Status: DC | PRN

## 2011-08-09 MED ORDER — MIDAZOLAM HCL 5 MG/5ML IJ SOLN
INTRAMUSCULAR | Status: DC | PRN
  Administered 2011-08-09: 2 mg via INTRAVENOUS

## 2011-08-09 MED ORDER — KETOROLAC TROMETHAMINE 30 MG/ML IJ SOLN
INTRAMUSCULAR | Status: AC
  Filled 2011-08-09: qty 1

## 2011-08-09 MED ORDER — FENTANYL CITRATE 0.05 MG/ML IJ SOLN
INTRAMUSCULAR | Status: DC | PRN
  Administered 2011-08-09: 100 ug via INTRAVENOUS
  Administered 2011-08-09: 25 ug via INTRAVENOUS

## 2011-08-09 MED ORDER — KETOROLAC TROMETHAMINE 30 MG/ML IJ SOLN
INTRAMUSCULAR | Status: DC | PRN
  Administered 2011-08-09: 30 mg via INTRAVENOUS

## 2011-08-09 MED ORDER — LIDOCAINE HCL (CARDIAC) 20 MG/ML IV SOLN
INTRAVENOUS | Status: DC | PRN
  Administered 2011-08-09: 50 mg via INTRAVENOUS

## 2011-08-09 MED ORDER — SILVER NITRATE-POT NITRATE 75-25 % EX MISC
CUTANEOUS | Status: AC
  Filled 2011-08-09: qty 3

## 2011-08-09 SURGICAL SUPPLY — 14 items
ABLATOR ENDOMETRIAL BIPOLAR (ABLATOR) ×2 IMPLANT
CANISTER SUCTION 2500CC (MISCELLANEOUS) ×2 IMPLANT
CATH ROBINSON RED A/P 16FR (CATHETERS) ×2 IMPLANT
CLOTH BEACON ORANGE TIMEOUT ST (SAFETY) ×2 IMPLANT
CONTAINER PREFILL 10% NBF 60ML (FORM) ×4 IMPLANT
ELECT REM PT RETURN 9FT ADLT (ELECTROSURGICAL)
ELECTRODE REM PT RTRN 9FT ADLT (ELECTROSURGICAL) IMPLANT
GLOVE BIO SURGEON STRL SZ7 (GLOVE) ×4 IMPLANT
GOWN PREVENTION PLUS LG XLONG (DISPOSABLE) ×4 IMPLANT
GOWN STRL REIN XL XLG (GOWN DISPOSABLE) ×2 IMPLANT
LOOP ANGLED CUTTING 22FR (CUTTING LOOP) IMPLANT
PACK HYSTEROSCOPY LF (CUSTOM PROCEDURE TRAY) ×2 IMPLANT
TOWEL OR 17X24 6PK STRL BLUE (TOWEL DISPOSABLE) ×4 IMPLANT
WATER STERILE IRR 1000ML POUR (IV SOLUTION) ×2 IMPLANT

## 2011-08-09 NOTE — Brief Op Note (Signed)
08/09/2011  7:47 AM  PATIENT:  Lauren Espinoza  41 y.o. female  PRE-OPERATIVE DIAGNOSIS:  Menorrhagia  POST-OPERATIVE DIAGNOSIS:  Menorrhagia  PROCEDURE:  Procedure(s) (LRB): DILATATION & CURETTAGE/HYSTEROSCOPY WITH NOVASURE ABLATION (N/A)  SURGEON:  Surgeon(s) and Role:    * Loney Laurence, MD - Primary  PHYSICIAN ASSISTANT:   ASSISTANTS: none   ANESTHESIA:   general  EBL:  Total I/O In: 1000 [I.V.:1000] Out: 35 [Urine:25; Blood:10]  SPECIMEN:  Source of Specimen:  uterine currettings  DISPOSITION OF SPECIMEN:  PATHOLOGY  COUNTS:  YES  PLAN OF CARE: Discharge to home after PACU  PATIENT DISPOSITION:  PACU - hemodynamically stable.   Delay start of Pharmacological VTE agent (>24hrs) due to surgical blood loss or risk of bleeding: not applicable  Findings:  Shaggy endometrium.  Medications: none.  Complications: none.  After adequate anesthesia was achieved, the patient was prepped and draped in the usual sterile fashion.  The speculum was placed in the vagina and the cervix stabilized with a single-tooth tenaculum.  The cervix was dilated with pratt dilators and the hysteroscope passed inside the endometrial cavity.  The above findings were noted and sharp curettage was then performed and uterine curettings sent to path.  The cavity length was 5 cm and the Novasure instrument successfully seated.  The width was 4.6 cm and the CO2 test passed.  The ablation went for 79 sec at at power of 120 watts.  Once the ablation was completed, the Novasure instrument was removed and the hysteroscope passed into the cavity again.  Good contact was seen in all areas.   All instruments were then removed from the uterus and vagina.  The patient tolerated the procedure well.    Amar Keenum A

## 2011-08-09 NOTE — Op Note (Signed)
08/09/2011  7:47 AM  PATIENT:  Lauren Espinoza  41 y.o. female  PRE-OPERATIVE DIAGNOSIS:  Menorrhagia  POST-OPERATIVE DIAGNOSIS:  Menorrhagia  PROCEDURE:  Procedure(s) (LRB): DILATATION & CURETTAGE/HYSTEROSCOPY WITH NOVASURE ABLATION (N/A)  SURGEON:  Surgeon(s) and Role:    * Zulma Court A Kit Brubacher, MD - Primary  PHYSICIAN ASSISTANT:   ASSISTANTS: none   ANESTHESIA:   general  EBL:  Total I/O In: 1000 [I.V.:1000] Out: 35 [Urine:25; Blood:10]  SPECIMEN:  Source of Specimen:  uterine currettings  DISPOSITION OF SPECIMEN:  PATHOLOGY  COUNTS:  YES  PLAN OF CARE: Discharge to home after PACU  PATIENT DISPOSITION:  PACU - hemodynamically stable.   Delay start of Pharmacological VTE agent (>24hrs) due to surgical blood loss or risk of bleeding: not applicable  Findings:  Shaggy endometrium.  Medications: none.  Complications: none.  After adequate anesthesia was achieved, the patient was prepped and draped in the usual sterile fashion.  The speculum was placed in the vagina and the cervix stabilized with a single-tooth tenaculum.  The cervix was dilated with pratt dilators and the hysteroscope passed inside the endometrial cavity.  The above findings were noted and sharp curettage was then performed and uterine curettings sent to path.  The cavity length was 5 cm and the Novasure instrument successfully seated.  The width was 4.6 cm and the CO2 test passed.  The ablation went for 79 sec at at power of 120 watts.  Once the ablation was completed, the Novasure instrument was removed and the hysteroscope passed into the cavity again.  Good contact was seen in all areas.   All instruments were then removed from the uterus and vagina.  The patient tolerated the procedure well.    [name]    

## 2011-08-09 NOTE — Transfer of Care (Signed)
Immediate Anesthesia Transfer of Care Note  Patient: Lauren Espinoza  Procedure(s) Performed: Procedure(s) (LRB): DILATATION & CURETTAGE/HYSTEROSCOPY WITH NOVASURE ABLATION (N/A)  Patient Location: PACU  Anesthesia Type: General  Level of Consciousness: sedated  Airway & Oxygen Therapy: Patient Spontanous Breathing and Patient connected to nasal cannula oxygen  Post-op Assessment: Report given to PACU RN  Post vital signs: Reviewed and stable  Complications: No apparent anesthesia complications

## 2011-08-09 NOTE — H&P (Signed)
41 y.o. yo complains of menorrhagia.  Her periods have gotten progressively worse and she is not bleeding through super tampons.  Periods are heavy for 5-6 days.  She denies metrarhagia.  Husband has had vasectomy.  Past Medical History  Diagnosis Date  . HYPERTENSION 04/20/2008  . BACK PAIN, UPPER 09/03/2008  . DVT, HX OF 04/20/2008  . Edema   . Left shoulder pain     sees Dr Chaney Malling  . Diabetes mellitus   . Hyperlipidemia   . SEIZURE DISORDER 04/20/2008    last seizure  1995   Past Surgical History  Procedure Date  . Cholecystectomy   . Cesarean section     History   Social History  . Marital Status: Married    Spouse Name: N/A    Number of Children: N/A  . Years of Education: N/A   Occupational History  . accounting  supervisor  [employer]   Social History Main Topics  . Smoking status: Never Smoker   . Smokeless tobacco: Never Used  . Alcohol Use: No  . Drug Use: No  . Sexually Active: Not on file   Other Topics Concern  . Not on file   Social History Narrative  . No narrative on file    No current facility-administered medications on file prior to encounter.   Current Outpatient Prescriptions on File Prior to Encounter  Medication Sig Dispense Refill  . atorvastatin (LIPITOR) 20 MG tablet Take 1 tablet (20 mg total) by mouth daily.  90 tablet  3  . hydrochlorothiazide (HYDRODIURIL) 25 MG tablet Take 1 tablet (25 mg total) by mouth daily.  90 tablet  3  . lisinopril (PRINIVIL,ZESTRIL) 20 MG tablet Take 1 tablet (20 mg total) by mouth daily.  90 tablet  3  . metFORMIN (GLUCOPHAGE) 500 MG tablet Take 1 tablet (500 mg total) by mouth 2 (two) times daily with a meal.  180 tablet  3  . PHENobarbital (LUMINAL) 97.2 MG tablet Take 1 tablet (97.2 mg total) by mouth 2 (two) times daily.  180 tablet  1    No Known Allergies   Filed Vitals:   08/09/11 0620  BP: 129/89  Pulse: 94  Temp: 98.2 F (36.8 C)  Resp: 18     Lungs: clear to ascultation Cor:   RRR Abdomen:  soft, nontender, nondistended. Ex:  no cords, erythema Pelvic:  NEFG, 7 weeks size no massess.  RV  U/S 6x5x5, normal ovaries.  EM 4 mm.  Three small fibroids less than a cm.   A:  For D&C, hysteroscopy, and novasure.   P:  All risks, benefits and alternatives d/w patient and she desires to proceed.   Dariela Stoker A

## 2011-08-09 NOTE — Anesthesia Preprocedure Evaluation (Signed)
Anesthesia Evaluation  Patient identified by MRN, date of birth, ID band Patient awake    Reviewed: Allergy & Precautions, H&P , Patient's Chart, lab work & pertinent test results, reviewed documented beta blocker date and time   Airway Mallampati: II TM Distance: >3 FB Neck ROM: full    Dental No notable dental hx.    Pulmonary  breath sounds clear to auscultation  Pulmonary exam normal       Cardiovascular hypertension (well controlled), On Medications Rhythm:regular Rate:Normal     Neuro/Psych Seizures -,     GI/Hepatic   Endo/Other  Diabetes mellitus-, Type obesity  Renal/GU      Musculoskeletal   Abdominal   Peds  Hematology   Anesthesia Other Findings   Reproductive/Obstetrics                           Anesthesia Physical Anesthesia Plan  ASA: III  Anesthesia Plan: General   Post-op Pain Management:    Induction: Intravenous  Airway Management Planned: LMA  Additional Equipment:   Intra-op Plan:   Post-operative Plan:   Informed Consent: I have reviewed the patients History and Physical, chart, labs and discussed the procedure including the risks, benefits and alternatives for the proposed anesthesia with the patient or authorized representative who has indicated his/her understanding and acceptance.   Dental Advisory Given  Plan Discussed with: CRNA and Surgeon  Anesthesia Plan Comments: (  Discussed  general anesthesia, including possible nausea, instrumentation of airway, sore throat,pulmonary aspiration, etc. I asked if the were any outstanding questions, or  concerns before we proceeded. )        Anesthesia Quick Evaluation

## 2011-08-09 NOTE — Discharge Instructions (Signed)

## 2011-08-09 NOTE — Anesthesia Postprocedure Evaluation (Signed)
Anesthesia Post Note  Patient: Lauren Espinoza  Procedure(s) Performed: Procedure(s) (LRB): DILATATION & CURETTAGE/HYSTEROSCOPY WITH NOVASURE ABLATION (N/A)  Anesthesia type: General  Patient location: PACU  Post pain: Pain level controlled  Post assessment: Post-op Vital signs reviewed  Last Vitals:  Filed Vitals:   08/09/11 0800  BP: 113/89  Pulse: 83  Temp: 36.9 C  Resp: 20    Post vital signs: Reviewed  Level of consciousness: sedated  Complications: No apparent anesthesia complicationsfj

## 2011-08-15 ENCOUNTER — Ambulatory Visit: Payer: 59 | Admitting: Family Medicine

## 2011-08-23 ENCOUNTER — Ambulatory Visit: Payer: 59 | Admitting: Family Medicine

## 2011-08-28 ENCOUNTER — Encounter: Payer: Self-pay | Admitting: Family Medicine

## 2011-08-28 ENCOUNTER — Ambulatory Visit (INDEPENDENT_AMBULATORY_CARE_PROVIDER_SITE_OTHER): Payer: 59 | Admitting: Family Medicine

## 2011-08-28 VITALS — BP 120/80 | HR 114 | Temp 98.9°F

## 2011-08-28 DIAGNOSIS — J329 Chronic sinusitis, unspecified: Secondary | ICD-10-CM

## 2011-08-28 MED ORDER — AZITHROMYCIN 250 MG PO TABS: ORAL_TABLET | ORAL | Status: AC

## 2011-08-28 NOTE — Progress Notes (Signed)
  Subjective:    Patient ID: Lauren Espinoza, female    DOB: [birth year], 41 y.o.   MRN: [id number]  HPI Here for 5 days of sinus pressure, PND, HA, ST, and a dry cough.    Review of Systems  Constitutional: Negative.   HENT: Positive for congestion and postnasal drip.   Eyes: Negative.   Respiratory: Positive for cough.        Objective:   Physical Exam  Constitutional: She appears well-developed and well-nourished.  HENT:  Right Ear: External ear normal.  Left Ear: External ear normal.  Nose: Nose normal.  Mouth/Throat: Oropharynx is clear and moist. No oropharyngeal exudate.  Eyes: Conjunctivae are normal.  Neck: No thyromegaly present.  Pulmonary/Chest: Effort normal and breath sounds normal.  Lymphadenopathy:    She has no cervical adenopathy.          Assessment & Plan:  Add Mucinex D prn

## 2011-09-11 ENCOUNTER — Ambulatory Visit (INDEPENDENT_AMBULATORY_CARE_PROVIDER_SITE_OTHER): Payer: 59 | Admitting: Family Medicine

## 2011-09-11 ENCOUNTER — Encounter: Payer: Self-pay | Admitting: Family Medicine

## 2011-09-11 VITALS — BP 122/78 | HR 110 | Temp 98.7°F

## 2011-09-11 DIAGNOSIS — E119 Type 2 diabetes mellitus without complications: Secondary | ICD-10-CM

## 2011-09-11 DIAGNOSIS — I10 Essential (primary) hypertension: Secondary | ICD-10-CM

## 2011-09-11 DIAGNOSIS — E785 Hyperlipidemia, unspecified: Secondary | ICD-10-CM

## 2011-09-11 LAB — LIPID PANEL
HDL: 45 mg/dL (ref >39.00)
Total CHOL/HDL Ratio: 4
Triglycerides: 242 mg/dL — ABNORMAL HIGH (ref 0.0–149.0)

## 2011-09-11 LAB — HEPATIC FUNCTION PANEL
ALT: 20 U/L (ref 0–35)
Total Bilirubin: 0.3 mg/dL (ref 0.3–1.2)

## 2011-09-11 NOTE — Progress Notes (Signed)
  Subjective:    Patient ID: Lauren Espinoza, female    DOB: 03-19-1971, 41 y.o.   MRN: [id number]  HPI Here for a 3 month follow up on HTn, diabetes, and lipids. She feels well, and she has lost 10 lbs.    Review of Systems  Constitutional: Negative.   Respiratory: Negative.   Cardiovascular: Negative.        Objective:   Physical Exam  Constitutional: She appears well-developed and well-nourished.  Cardiovascular: Normal rate, regular rhythm, normal heart sounds and intact distal pulses.   Pulmonary/Chest: Effort normal and breath sounds normal.          Assessment & Plan:  Get fasting labs today.

## 2011-09-13 NOTE — Progress Notes (Signed)
Quick Note:  I spoke with pt ______ 

## 2011-11-17 ENCOUNTER — Other Ambulatory Visit: Payer: Self-pay | Admitting: Family Medicine

## 2011-11-21 NOTE — Telephone Encounter (Signed)
Okay to fill? 

## 2011-11-21 NOTE — Telephone Encounter (Signed)
Call in #[phone] with 1 rf 

## 2012-02-02 ENCOUNTER — Ambulatory Visit (INDEPENDENT_AMBULATORY_CARE_PROVIDER_SITE_OTHER): Payer: 59 | Admitting: Family Medicine

## 2012-02-02 ENCOUNTER — Encounter: Payer: Self-pay | Admitting: Family Medicine

## 2012-02-02 VITALS — BP 126/80 | HR 118 | Temp 98.7°F

## 2012-02-02 DIAGNOSIS — M436 Torticollis: Secondary | ICD-10-CM

## 2012-02-02 MED ORDER — CYCLOBENZAPRINE HCL 10 MG PO TABS: 10.0000 mg | ORAL_TABLET | Freq: Three times a day (TID) | ORAL | Status: DC | PRN

## 2012-02-02 MED ORDER — DICLOFENAC SODIUM 75 MG PO TBEC: 75.0000 mg | DELAYED_RELEASE_TABLET | Freq: Two times a day (BID) | ORAL | Status: DC

## 2012-02-02 MED ORDER — HYDROCODONE-ACETAMINOPHEN 10-325 MG PO TABS: 1.0000 | ORAL_TABLET | Freq: Four times a day (QID) | ORAL | Status: DC | PRN

## 2012-02-02 MED ORDER — METHYLPREDNISOLONE ACETATE 80 MG/ML IJ SUSP
120.0000 mg | Freq: Once | INTRAMUSCULAR | Status: AC
  Administered 2012-02-02: 120 mg via INTRAMUSCULAR

## 2012-02-02 NOTE — Progress Notes (Signed)
  Subjective:    Patient ID: Lauren Espinoza, female    DOB: 22-Apr-1970, 41 y.o.   MRN: [id number]  HPI Here for one week of pain and spasms in the neck after she woke up this way one morning. No trauma hx. The right neck and upper back have been tight and painful. Using heat and Tylenol. Sometimes a Valium helps. She has been out of work to take care of her child but she is scheduled to return to work next Monday.    Review of Systems  Constitutional: Negative.   HENT: Positive for neck pain and neck stiffness.        Objective:   Physical Exam  Constitutional:       In pain, head movement is very limited   Musculoskeletal:       Tender with a lot of spasm in the right neck and upper trapezius, limited ROM           Assessment & Plan:  Heat, massage. Given a steroid shot. Try Diclofenac, Flexeril, and Vicodin prn

## 2012-02-02 NOTE — Addendum Note (Signed)
Addended by: Aniceto Boss A on: 02/02/2012 09:52 AM   Modules accepted: Orders

## 2012-04-24 ENCOUNTER — Other Ambulatory Visit: Payer: Self-pay | Admitting: Family Medicine

## 2012-05-23 ENCOUNTER — Other Ambulatory Visit: Payer: Self-pay | Admitting: Family Medicine

## 2012-05-23 NOTE — Telephone Encounter (Signed)
Refill request for Phenobarbital 97.2 mg take 1 po bid

## 2012-05-23 NOTE — Telephone Encounter (Signed)
Call in #180 with one rf  

## 2012-05-25 MED ORDER — PHENOBARBITAL 97.2 MG PO TABS: ORAL_TABLET | ORAL | Status: DC

## 2012-05-25 NOTE — Telephone Encounter (Signed)
rx called in

## 2012-07-08 ENCOUNTER — Telehealth: Payer: Self-pay | Admitting: Family Medicine

## 2012-07-08 DIAGNOSIS — Z0000 Encounter for general adult medical examination without abnormal findings: Secondary | ICD-10-CM

## 2012-07-08 NOTE — Telephone Encounter (Signed)
Pt is requesting orders for her CPX labs, so that she can stop by the [place] office and have them done at her convenience. Please assist.

## 2012-07-09 NOTE — Telephone Encounter (Signed)
Labs have been ordered for Lauren Espinoza.  Called and left a message for pt that labs have been ordered for Lauren Espinoza.

## 2012-08-02 ENCOUNTER — Other Ambulatory Visit (INDEPENDENT_AMBULATORY_CARE_PROVIDER_SITE_OTHER): Payer: 59

## 2012-08-02 DIAGNOSIS — Z0000 Encounter for general adult medical examination without abnormal findings: Secondary | ICD-10-CM

## 2012-08-02 LAB — HEPATIC FUNCTION PANEL
ALT: 25 U/L (ref 0–35)
AST: 17 U/L (ref 0–37)
Albumin: 3.8 g/dL (ref 3.5–5.2)
Alkaline Phosphatase: 37 U/L — ABNORMAL LOW (ref 39–117)
Total Protein: 6.5 g/dL (ref 6.0–8.3)

## 2012-08-02 LAB — LIPID PANEL
Cholesterol: 166 mg/dL (ref 0–200)
HDL: 38.4 mg/dL — ABNORMAL LOW (ref >39.00)
Total CHOL/HDL Ratio: 4
Triglycerides: 245 mg/dL — ABNORMAL HIGH (ref 0.0–149.0)
VLDL: 49 mg/dL — ABNORMAL HIGH (ref 0.0–40.0)

## 2012-08-02 LAB — TSH: TSH: 3.26 u[IU]/mL (ref 0.35–5.50)

## 2012-08-02 LAB — CBC WITH DIFFERENTIAL/PLATELET
Basophils Absolute: 0 10*3/uL (ref 0.0–0.1)
Eosinophils Relative: 1 % (ref 0.0–5.0)
HCT: 39.4 % (ref 36.0–46.0)
Lymphocytes Relative: 34.3 % (ref 12.0–46.0)
Lymphs Abs: 2.4 10*3/uL (ref 0.7–4.0)
Monocytes Relative: 8.4 % (ref 3.0–12.0)
Platelets: 274 10*3/uL (ref 150.0–400.0)
WBC: 7.1 10*3/uL (ref 4.5–10.5)

## 2012-08-02 LAB — BASIC METABOLIC PANEL
CO2: 29 mEq/L (ref 19–32)
Chloride: 102 mEq/L (ref 96–112)
Creatinine, Ser: 0.7 mg/dL (ref 0.4–1.2)
Potassium: 3.7 mEq/L (ref 3.5–5.1)
Sodium: 138 mEq/L (ref 135–145)

## 2012-08-02 LAB — LDL CHOLESTEROL, DIRECT: Direct LDL: 93.3 mg/dL

## 2012-08-06 NOTE — Progress Notes (Signed)
Quick Note:  I spoke with pt ______ 

## 2012-08-07 ENCOUNTER — Ambulatory Visit (INDEPENDENT_AMBULATORY_CARE_PROVIDER_SITE_OTHER): Payer: 59 | Admitting: Family Medicine

## 2012-08-07 ENCOUNTER — Encounter: Payer: Self-pay | Admitting: Family Medicine

## 2012-08-07 VITALS — BP 116/80 | HR 113 | Temp 98.3°F | Ht 69.0 in

## 2012-08-07 DIAGNOSIS — Z0000 Encounter for general adult medical examination without abnormal findings: Secondary | ICD-10-CM

## 2012-08-07 DIAGNOSIS — Z23 Encounter for immunization: Secondary | ICD-10-CM

## 2012-08-07 DIAGNOSIS — E119 Type 2 diabetes mellitus without complications: Secondary | ICD-10-CM | POA: Insufficient documentation

## 2012-08-07 DIAGNOSIS — R569 Unspecified convulsions: Secondary | ICD-10-CM

## 2012-08-07 MED ORDER — PHENOBARBITAL 97.2 MG PO TABS: ORAL_TABLET | ORAL | Status: DC

## 2012-08-07 MED ORDER — ATORVASTATIN CALCIUM 20 MG PO TABS: ORAL_TABLET | ORAL | Status: DC

## 2012-08-07 MED ORDER — DIAZEPAM 5 MG PO TABS: 5.0000 mg | ORAL_TABLET | Freq: Two times a day (BID) | ORAL | Status: AC | PRN

## 2012-08-07 MED ORDER — HYDROCHLOROTHIAZIDE 25 MG PO TABS: ORAL_TABLET | ORAL | Status: DC

## 2012-08-07 MED ORDER — LISINOPRIL 20 MG PO TABS: ORAL_TABLET | ORAL | Status: DC

## 2012-08-07 MED ORDER — METFORMIN HCL 500 MG PO TABS: 500.0000 mg | ORAL_TABLET | Freq: Two times a day (BID) | ORAL | Status: DC

## 2012-08-07 NOTE — Addendum Note (Signed)
Addended by: Aniceto Boss A on: 08/07/2012 10:34 AM   Modules accepted: Orders

## 2012-08-07 NOTE — Progress Notes (Signed)
  Subjective:    Patient ID: Lauren Espinoza, female    DOB: May 18, 1970, 42 y.o.   MRN: [id number]  HPI 42 yr old female for a cpx. She feels well and has no concerns. She would like a TDaP shot today. Her last phenobarb level was about 5 years ago. She is watching her diet but has not been exercising much lately.    Review of Systems  Constitutional: Negative.   HENT: Negative.   Eyes: Negative.   Respiratory: Negative.   Cardiovascular: Negative.   Gastrointestinal: Negative.   Genitourinary: Negative for dysuria, urgency, frequency, hematuria, flank pain, decreased urine volume, enuresis, difficulty urinating, pelvic pain and dyspareunia.  Musculoskeletal: Negative.   Skin: Negative.   Neurological: Negative.   Psychiatric/Behavioral: Negative.        Objective:   Physical Exam  Constitutional: She is oriented to person, place, and time. She appears well-developed and well-nourished. No distress.  HENT:  Head: Normocephalic and atraumatic.  Right Ear: External ear normal.  Left Ear: External ear normal.  Nose: Nose normal.  Mouth/Throat: Oropharynx is clear and moist. No oropharyngeal exudate.  Eyes: Conjunctivae and EOM are normal. Pupils are equal, round, and reactive to light. No scleral icterus.  Neck: Normal range of motion. Neck supple. No JVD present. No thyromegaly present.  Cardiovascular: Normal rate, regular rhythm, normal heart sounds and intact distal pulses.  Exam reveals no gallop and no friction rub.   No murmur heard. Pulmonary/Chest: Effort normal and breath sounds normal. No respiratory distress. She has no wheezes. She has no rales. She exhibits no tenderness.  Abdominal: Soft. Bowel sounds are normal. She exhibits no distension and no mass. There is no tenderness. There is no rebound and no guarding.  Musculoskeletal: Normal range of motion. She exhibits no edema and no tenderness.  Lymphadenopathy:    She has no cervical adenopathy.  Neurological: She is  alert and oriented to person, place, and time. She has normal reflexes. No cranial nerve deficit. She exhibits normal muscle tone. Coordination normal.  Skin: Skin is warm and dry. No rash noted. No erythema.  Psychiatric: She has a normal mood and affect. Her behavior is normal. Judgment and thought content normal.          Assessment & Plan:  Well exam. Get an A1c and a phenobarbital level today.

## 2012-08-13 NOTE — Progress Notes (Signed)
Quick Note:  I left voice message with results. ______ 

## 2012-09-05 ENCOUNTER — Encounter: Payer: Self-pay | Admitting: Family Medicine

## 2012-09-05 ENCOUNTER — Ambulatory Visit (INDEPENDENT_AMBULATORY_CARE_PROVIDER_SITE_OTHER): Payer: 59 | Admitting: Family Medicine

## 2012-09-05 VITALS — BP 124/70 | HR 113 | Temp 98.3°F

## 2012-09-05 DIAGNOSIS — J019 Acute sinusitis, unspecified: Secondary | ICD-10-CM

## 2012-09-05 MED ORDER — AZITHROMYCIN 250 MG PO TABS: ORAL_TABLET | ORAL | Status: DC

## 2012-09-05 MED ORDER — FLUCONAZOLE 150 MG PO TABS: 150.0000 mg | ORAL_TABLET | Freq: Once | ORAL | Status: AC

## 2012-09-05 NOTE — Progress Notes (Signed)
  Subjective:    Patient ID: Lauren Espinoza, female    DOB: 1970-12-25, 42 y.o.   MRN: [id number]  HPI Here for one week of sinus pressure , PND, ST, and a dry cough. No fever.    Review of Systems  Constitutional: Negative.   HENT: Positive for congestion, postnasal drip and sinus pressure.   Eyes: Negative.   Respiratory: Positive for cough.        Objective:   Physical Exam  Constitutional: She appears well-developed and well-nourished.  HENT:  Right Ear: External ear normal.  Left Ear: External ear normal.  Nose: Nose normal.  Mouth/Throat: Oropharynx is clear and moist.  Eyes: Conjunctivae are normal.  Pulmonary/Chest: Effort normal and breath sounds normal.  Lymphadenopathy:    She has no cervical adenopathy.          Assessment & Plan:  Add Mucinex

## 2012-09-25 ENCOUNTER — Encounter: Payer: Self-pay | Admitting: Family Medicine

## 2012-09-25 ENCOUNTER — Ambulatory Visit (INDEPENDENT_AMBULATORY_CARE_PROVIDER_SITE_OTHER): Payer: 59 | Admitting: Family Medicine

## 2012-09-25 VITALS — BP 118/74 | HR 77 | Temp 98.4°F | Ht 70.0 in

## 2012-09-25 DIAGNOSIS — R209 Unspecified disturbances of skin sensation: Secondary | ICD-10-CM

## 2012-09-25 DIAGNOSIS — R202 Paresthesia of skin: Secondary | ICD-10-CM

## 2012-09-25 NOTE — Progress Notes (Signed)
  Subjective:    Patient ID: Lauren Espinoza, female    DOB: 1971/02/22, 42 y.o.   MRN: [id number]  HPI Patient seen with numbness involving left third and fourth digits Mostly distal fingertip involvement. She has noticed that the median side of the ring finger is involved. She does a lot of typing. Denies any wrist or hand pain. She had a week of vacation last week and did not notice as much numbness during that time. She is right-hand dominant.  Denies any cervical neck pain. No arm or forearm numbness or pain. No history of carpal tunnel syndrome. No exacerbating factors other typing. No alleviating factors. She has not tried any wrist brace. No upper extremity edema  Denies any other areas of paresthesia. No focal weakness. No recent visual changes. No headaches.  Past Medical History  Diagnosis Date  . HYPERTENSION 04/20/2008  . BACK PAIN, UPPER 09/03/2008  . DVT, HX OF 04/20/2008  . Edema   . Left shoulder pain     sees Dr Chaney Malling  . Diabetes mellitus   . Hyperlipidemia   . SEIZURE DISORDER 04/20/2008    last seizure  1995   Past Surgical History  Procedure Laterality Date  . Cholecystectomy    . Cesarean section    . Endometrial ablation  08-09-11    per Dr. Henderson Cloud     reports that she has never smoked. She has never used smokeless tobacco. She reports that she does not drink alcohol or use illicit drugs. family history includes Diabetes in her father, maternal uncle, paternal grandfather, and paternal grandmother. No Known Allergies    Review of Systems  Constitutional: Negative for fever and chills.  HENT: Negative for neck pain.   Respiratory: Negative for shortness of breath.   Cardiovascular: Negative for chest pain and leg swelling.  Neurological: Positive for numbness. Negative for weakness.  Hematological: Negative for adenopathy. Does not bruise/bleed easily.       Objective:   Physical Exam  Constitutional: She is oriented to person, place, and time. She  appears well-developed and well-nourished.  Neck: Neck supple. No thyromegaly present.  Cardiovascular: Normal rate and regular rhythm.   Pulmonary/Chest: Effort normal and breath sounds normal. No respiratory distress. She has no wheezes. She has no rales.  Musculoskeletal:  Left upper extremity reveals no edema. No ecchymosis. No localized tenderness. Good distal wrist pulses  Neurological: She is alert and oriented to person, place, and time. No cranial nerve deficit.  Full-strength upper extremities. Symmetric upper extremity reflexes. Mild central impairment to dole and pinprick involving third finger and median half of ring finger          Assessment & Plan:  Paresthesias involving left hand. She is describing median nerve involvement. Somewhat unusual distribution and presentation for carpal tunnel though with her history of typing and distribution suspect this is related to carpal tunnel syndrome. We've recommended wrist splinting especially at night the next few weeks and be in touch with primary  if not improving at that point

## 2012-09-25 NOTE — Patient Instructions (Addendum)
Carpal Tunnel Release Carpal tunnel release is done to relieve the pressure on the nerves and tendons on the bottom side of your wrist.  LET YOUR CAREGIVER KNOW ABOUT:   Allergies to food or medicine.  Medicines taken, including vitamins, herbs, eyedrops, over-the-counter medicines, and creams.  Use of steroids (by mouth or creams).  Previous problems with anesthetics or numbing medicines.  History of bleeding problems or blood clots.  Previous surgery.  Other health problems, including diabetes and kidney problems.  Possibility of pregnancy, if this applies. RISKS AND COMPLICATIONS  Some problems that may happen after this procedure include:  Infection.  Damage to the nerves, arteries or tendons could occur. This would be very uncommon.  Bleeding. BEFORE THE PROCEDURE   This surgery may be done while you are asleep (general anesthetic) or may be done under a block where only your forearm and the surgical area is numb.  If the surgery is done under a block, the numbness will gradually wear off within several hours after surgery. HOME CARE INSTRUCTIONS   Have a responsible person with you for 24 hours.  Do not drive a car or use public transportation for 24 hours.  Only take over-the-counter or prescription medicines for pain, discomfort, or fever as directed by your caregiver. Take them as directed.  You may put ice on the palm side of the affected wrist.  Put ice in a plastic bag.  Place a towel between your skin and the bag.  Leave the ice on for 20 to 30 minutes, 4 times per day.  If you were given a splint to keep your wrist from bending, use it as directed. It is important to wear the splint at night or as directed. Use the splint for as long as you have pain or numbness in your hand, arm, or wrist. This may take 1 to 2 months.  Keep your hand raised (elevated) above the level of your heart as much as possible. This keeps swelling down and helps with  discomfort.  Change bandages (dressings) as directed.  Keep the wound clean and dry. SEEK MEDICAL CARE IF:   You develop pain not relieved with medications.  You develop numbness of your hand.  You develop bleeding from your surgical site.  You have an oral temperature above 102 F (38.9 C).  You develop redness or swelling of the surgical site.  You develop new, unexplained problems. SEEK IMMEDIATE MEDICAL CARE IF:   You develop a rash.  You have difficulty breathing.  You develop any reaction or side effects to medications given. Document Released: 05/27/2003 Document Revised: 05/29/2011 Document Reviewed: 01/10/2007 [facility] Patient Information 2014 [place], [place].  Use left wrist splint especially at night for the next 2-3 weeks and follow up with primary if no better after that.

## 2012-10-10 ENCOUNTER — Other Ambulatory Visit: Payer: Self-pay | Admitting: Obstetrics and Gynecology

## 2013-01-28 ENCOUNTER — Telehealth: Payer: Self-pay | Admitting: Family Medicine

## 2013-01-28 MED ORDER — DIAZEPAM 5 MG PO TABS: 5.0000 mg | ORAL_TABLET | Freq: Two times a day (BID) | ORAL | Status: DC | PRN

## 2013-01-28 MED ORDER — PHENOBARBITAL 97.2 MG PO TABS: ORAL_TABLET | ORAL | Status: DC

## 2013-01-28 NOTE — Telephone Encounter (Signed)
I faxed below scripts to [facility].

## 2013-01-28 NOTE — Telephone Encounter (Signed)
[facility]. Please send new rxs phenobarbital 97.2mg  #180 and diazepam 5 mg #90 with refills

## 2013-01-28 NOTE — Telephone Encounter (Signed)
Ready to be faxed.

## 2013-05-09 ENCOUNTER — Telehealth: Payer: Self-pay | Admitting: Family Medicine

## 2013-05-09 MED ORDER — ATORVASTATIN CALCIUM 20 MG PO TABS: ORAL_TABLET | ORAL | Status: DC

## 2013-05-09 NOTE — Telephone Encounter (Signed)
I sent script [facility] and spoke with pt. 

## 2013-05-09 NOTE — Telephone Encounter (Signed)
Pt needs 10 day supply of atorvastatin 20 mg sent to [facility]. This will be a new rx to [facility]. Pt is still waiting on mailorder

## 2013-05-14 ENCOUNTER — Encounter: Payer: Self-pay | Admitting: Family Medicine

## 2013-05-14 ENCOUNTER — Ambulatory Visit (INDEPENDENT_AMBULATORY_CARE_PROVIDER_SITE_OTHER): Payer: 59 | Admitting: Family Medicine

## 2013-05-14 VITALS — BP 110/80 | HR 120 | Temp 98.7°F

## 2013-05-14 DIAGNOSIS — R350 Frequency of micturition: Secondary | ICD-10-CM

## 2013-05-14 DIAGNOSIS — N390 Urinary tract infection, site not specified: Secondary | ICD-10-CM

## 2013-05-14 LAB — POCT URINALYSIS DIPSTICK
Bilirubin, UA: NEGATIVE
Blood, UA: NEGATIVE
Glucose, UA: NEGATIVE
KETONES UA: NEGATIVE
Nitrite, UA: NEGATIVE
PH UA: 6
SPEC GRAV UA: 1.025
Urobilinogen, UA: 0.2

## 2013-05-14 MED ORDER — ATORVASTATIN CALCIUM 20 MG PO TABS: ORAL_TABLET | ORAL | Status: DC

## 2013-05-14 MED ORDER — DIAZEPAM 5 MG PO TABS: 5.0000 mg | ORAL_TABLET | Freq: Two times a day (BID) | ORAL | Status: DC | PRN

## 2013-05-14 MED ORDER — LISINOPRIL 20 MG PO TABS: ORAL_TABLET | ORAL | Status: DC

## 2013-05-14 MED ORDER — HYDROCHLOROTHIAZIDE 25 MG PO TABS: ORAL_TABLET | ORAL | Status: DC

## 2013-05-14 MED ORDER — CIPROFLOXACIN HCL 500 MG PO TABS: 500.0000 mg | ORAL_TABLET | Freq: Two times a day (BID) | ORAL | Status: DC

## 2013-05-14 MED ORDER — METFORMIN HCL 500 MG PO TABS: 500.0000 mg | ORAL_TABLET | Freq: Two times a day (BID) | ORAL | Status: DC

## 2013-05-14 NOTE — Progress Notes (Signed)
Pre visit review using our clinic review tool, if applicable. No additional management support is needed unless otherwise documented below in the visit note. 

## 2013-05-14 NOTE — Progress Notes (Signed)
   Subjective:    Patient ID: Lauren Espinoza, female    DOB: 1971/02/20, 43 y.o.   MRN: [id number]  HPI Here for 2 days of right flank pain and urgency to urinate. no fever. She drinks plenty of water.    Review of Systems  Constitutional: Negative.   Gastrointestinal: Negative.   Genitourinary: Positive for urgency, frequency and flank pain. Negative for dysuria.       Objective:   Physical Exam  Constitutional: She appears well-developed and well-nourished.  Abdominal: Soft. Bowel sounds are normal. She exhibits no distension and no mass. There is no tenderness. There is no rebound and no guarding.          Assessment & Plan:  Culture the urine.

## 2013-05-16 LAB — URINE CULTURE: Colony Count: 30000

## 2013-08-14 ENCOUNTER — Encounter: Payer: Self-pay | Admitting: Family Medicine

## 2013-08-14 DIAGNOSIS — G40909 Epilepsy, unspecified, not intractable, without status epilepticus: Secondary | ICD-10-CM

## 2013-08-14 NOTE — Telephone Encounter (Signed)
I put this in as a future order, so she needs to tell the lab staff about this when she comes in

## 2013-08-15 ENCOUNTER — Telehealth: Payer: Self-pay | Admitting: Family Medicine

## 2013-08-15 MED ORDER — PHENOBARBITAL 97.2 MG PO TABS: ORAL_TABLET | ORAL | Status: DC

## 2013-08-15 NOTE — Telephone Encounter (Signed)
Script was faxed to [facility].  

## 2013-08-15 NOTE — Telephone Encounter (Signed)
[facility] - [place], [place] - [address] is requesting 90 day re-fill on PHENobarbital (LUMINAL) 97.2 MG tablet

## 2013-08-15 NOTE — Telephone Encounter (Signed)
This was done.

## 2013-08-15 NOTE — Telephone Encounter (Signed)
Done, ready to be faxed.

## 2013-08-18 ENCOUNTER — Encounter: Payer: Self-pay | Admitting: Family Medicine

## 2013-08-18 ENCOUNTER — Other Ambulatory Visit (INDEPENDENT_AMBULATORY_CARE_PROVIDER_SITE_OTHER): Payer: 59

## 2013-08-18 DIAGNOSIS — Z0000 Encounter for general adult medical examination without abnormal findings: Secondary | ICD-10-CM

## 2013-08-18 DIAGNOSIS — G40909 Epilepsy, unspecified, not intractable, without status epilepticus: Secondary | ICD-10-CM

## 2013-08-18 LAB — CBC WITH DIFFERENTIAL/PLATELET
Basophils Absolute: 0 10*3/uL (ref 0.0–0.1)
Basophils Relative: 0.6 % (ref 0.0–3.0)
EOS ABS: 0.1 10*3/uL (ref 0.0–0.7)
Eosinophils Relative: 1.6 % (ref 0.0–5.0)
HEMATOCRIT: 39.3 % (ref 36.0–46.0)
Hemoglobin: 13.3 g/dL (ref 12.0–15.0)
Lymphocytes Relative: 31.4 % (ref 12.0–46.0)
Lymphs Abs: 1.7 10*3/uL (ref 0.7–4.0)
MCHC: 33.8 g/dL (ref 30.0–36.0)
MCV: 91 fl (ref 78.0–100.0)
MONO ABS: 0.4 10*3/uL (ref 0.1–1.0)
Monocytes Relative: 7.3 % (ref 3.0–12.0)
NEUTROS PCT: 59.1 % (ref 43.0–77.0)
Neutro Abs: 3.2 10*3/uL (ref 1.4–7.7)
Platelets: 295 10*3/uL (ref 150.0–400.0)
RBC: 4.32 Mil/uL (ref 3.87–5.11)
RDW: 12.6 % (ref 11.5–15.5)
WBC: 5.4 10*3/uL (ref 4.0–10.5)

## 2013-08-18 LAB — LIPID PANEL
CHOL/HDL RATIO: 4
CHOLESTEROL: 169 mg/dL (ref 0–200)
HDL: 43.5 mg/dL (ref >39.00)
LDL Cholesterol: 92 mg/dL (ref 0–99)
Triglycerides: 170 mg/dL — ABNORMAL HIGH (ref 0.0–149.0)
VLDL: 34 mg/dL (ref 0.0–40.0)

## 2013-08-18 LAB — POCT URINALYSIS DIPSTICK
Bilirubin, UA: NEGATIVE
Blood, UA: NEGATIVE
Glucose, UA: NEGATIVE
Ketones, UA: NEGATIVE
Leukocytes, UA: NEGATIVE
Nitrite, UA: NEGATIVE
PROTEIN UA: NEGATIVE
SPEC GRAV UA: 1.025
UROBILINOGEN UA: 0.2
pH, UA: 5.5

## 2013-08-18 LAB — BASIC METABOLIC PANEL
BUN: 13 mg/dL (ref 6–23)
CHLORIDE: 102 meq/L (ref 96–112)
CO2: 29 meq/L (ref 19–32)
CREATININE: 0.7 mg/dL (ref 0.4–1.2)
Calcium: 9 mg/dL (ref 8.4–10.5)
GFR: 101.63 mL/min (ref >60.00)
Glucose, Bld: 125 mg/dL — ABNORMAL HIGH (ref 70–99)
POTASSIUM: 4 meq/L (ref 3.5–5.1)
Sodium: 140 mEq/L (ref 135–145)

## 2013-08-18 LAB — HEPATIC FUNCTION PANEL
ALBUMIN: 3.8 g/dL (ref 3.5–5.2)
ALT: 24 U/L (ref 0–35)
AST: 18 U/L (ref 0–37)
Alkaline Phosphatase: 32 U/L — ABNORMAL LOW (ref 39–117)
BILIRUBIN DIRECT: 0.1 mg/dL (ref 0.0–0.3)
Total Bilirubin: 0.6 mg/dL (ref 0.2–1.2)
Total Protein: 6.6 g/dL (ref 6.0–8.3)

## 2013-08-18 LAB — MICROALBUMIN / CREATININE URINE RATIO
Creatinine,U: 176.2 mg/dL
Microalb Creat Ratio: 0.3 mg/g (ref 0.0–30.0)
Microalb, Ur: 0.6 mg/dL (ref 0.0–1.9)

## 2013-08-18 LAB — HEMOGLOBIN A1C: Hgb A1c MFr Bld: 6.2 % (ref 4.6–6.5)

## 2013-08-18 LAB — TSH: TSH: 3.03 u[IU]/mL (ref 0.35–4.50)

## 2013-08-19 LAB — PHENOBARBITAL LEVEL: Phenobarbital: 20.2 mg/L (ref 15.0–40.0)

## 2013-08-25 ENCOUNTER — Ambulatory Visit (INDEPENDENT_AMBULATORY_CARE_PROVIDER_SITE_OTHER): Payer: 59 | Admitting: Family Medicine

## 2013-08-25 ENCOUNTER — Encounter: Payer: Self-pay | Admitting: Family Medicine

## 2013-08-25 VITALS — BP 113/77 | HR 85 | Temp 98.4°F | Ht 70.0 in

## 2013-08-25 DIAGNOSIS — I83893 Varicose veins of bilateral lower extremities with other complications: Secondary | ICD-10-CM

## 2013-08-25 DIAGNOSIS — Z0000 Encounter for general adult medical examination without abnormal findings: Secondary | ICD-10-CM

## 2013-08-25 NOTE — Progress Notes (Signed)
   Subjective:    Patient ID: Lauren Espinoza, female    DOB: 1970-04-22, 43 y.o.   MRN: [id number]  HPI 43 yr old female for a cpx. Her only complaint is of painful varicose veins in the right lower leg as the result of a DVT there in the past. She wears a compression stocking daily.    Review of Systems  Constitutional: Negative.   HENT: Negative.   Eyes: Negative.   Respiratory: Negative.   Cardiovascular: Positive for leg swelling. Negative for chest pain and palpitations.  Gastrointestinal: Negative.   Genitourinary: Negative for dysuria, urgency, frequency, hematuria, flank pain, decreased urine volume, enuresis, difficulty urinating, pelvic pain and dyspareunia.  Musculoskeletal: Negative.   Skin: Negative.   Neurological: Negative.   Psychiatric/Behavioral: Negative.        Objective:   Physical Exam  Constitutional: She is oriented to person, place, and time. She appears well-developed and well-nourished. No distress.  HENT:  Head: Normocephalic and atraumatic.  Right Ear: External ear normal.  Left Ear: External ear normal.  Nose: Nose normal.  Mouth/Throat: Oropharynx is clear and moist. No oropharyngeal exudate.  Eyes: Conjunctivae and EOM are normal. Pupils are equal, round, and reactive to light. No scleral icterus.  Neck: Normal range of motion. Neck supple. No JVD present. No thyromegaly present.  Cardiovascular: Normal rate, regular rhythm, normal heart sounds and intact distal pulses.  Exam reveals no gallop and no friction rub.   No murmur heard. Pulmonary/Chest: Effort normal and breath sounds normal. No respiratory distress. She has no wheezes. She has no rales. She exhibits no tenderness.  Abdominal: Soft. Bowel sounds are normal. She exhibits no distension and no mass. There is no tenderness. There is no rebound and no guarding.  Musculoskeletal: Normal range of motion. She exhibits no tenderness.  Varicose veins in the right lower leg  Lymphadenopathy:   She has no cervical adenopathy.  Neurological: She is alert and oriented to person, place, and time. She has normal reflexes. No cranial nerve deficit. She exhibits normal muscle tone. Coordination normal.  Skin: Skin is warm and dry. No rash noted. No erythema.  Psychiatric: She has a normal mood and affect. Her behavior is normal. Judgment and thought content normal.          Assessment & Plan:  Well exam. Encouraged her to lose weight. Refer to [facility]

## 2013-08-25 NOTE — Progress Notes (Signed)
Pre visit review using our clinic review tool, if applicable. No additional management support is needed unless otherwise documented below in the visit note. 

## 2013-08-27 ENCOUNTER — Encounter: Payer: Self-pay | Admitting: Family Medicine

## 2013-10-15 ENCOUNTER — Other Ambulatory Visit: Payer: Self-pay | Admitting: Obstetrics and Gynecology

## 2013-10-16 LAB — CYTOLOGY - PAP

## 2013-12-24 LAB — HM DIABETES EYE EXAM

## 2014-01-19 ENCOUNTER — Encounter: Payer: Self-pay | Admitting: Family Medicine

## 2014-02-10 ENCOUNTER — Encounter: Payer: Self-pay | Admitting: Family Medicine

## 2014-02-10 MED ORDER — PHENOBARBITAL 97.2 MG PO TABS: ORAL_TABLET | ORAL | Status: DC

## 2014-02-10 NOTE — Telephone Encounter (Signed)
Ready to be faxed in

## 2014-06-08 ENCOUNTER — Other Ambulatory Visit: Payer: Self-pay | Admitting: Family Medicine

## 2014-06-22 ENCOUNTER — Other Ambulatory Visit: Payer: Self-pay | Admitting: Family Medicine

## 2014-07-22 ENCOUNTER — Ambulatory Visit (INDEPENDENT_AMBULATORY_CARE_PROVIDER_SITE_OTHER): Payer: 59 | Admitting: Family Medicine

## 2014-07-22 ENCOUNTER — Encounter: Payer: Self-pay | Admitting: Family Medicine

## 2014-07-22 VITALS — BP 111/77 | HR 85 | Temp 98.5°F

## 2014-07-22 DIAGNOSIS — J029 Acute pharyngitis, unspecified: Secondary | ICD-10-CM

## 2014-07-22 DIAGNOSIS — J020 Streptococcal pharyngitis: Secondary | ICD-10-CM | POA: Diagnosis not present

## 2014-07-22 LAB — POCT RAPID STREP A (OFFICE): RAPID STREP A SCREEN: NEGATIVE

## 2014-07-22 MED ORDER — FLUCONAZOLE 150 MG PO TABS: 150.0000 mg | ORAL_TABLET | Freq: Once | ORAL | Status: DC

## 2014-07-22 MED ORDER — CEPHALEXIN 500 MG PO CAPS: 500.0000 mg | ORAL_CAPSULE | Freq: Three times a day (TID) | ORAL | Status: AC

## 2014-07-22 NOTE — Progress Notes (Signed)
Pre visit review using our clinic review tool, if applicable. No additional management support is needed unless otherwise documented below in the visit note. Pt declined to weigh 

## 2014-07-22 NOTE — Progress Notes (Signed)
   Subjective:    Patient ID: Lauren Espinoza, female    DOB: [birth year], 44 y.o.   MRN: [id number]  HPI Here for 3 days of fever and a ST. No cough or NVD. Her child was recently tested positive for a strep throat.    Review of Systems  Constitutional: Positive for fever.  HENT: Positive for congestion and sore throat. Negative for ear pain, postnasal drip and sinus pressure.   Eyes: Negative.   Respiratory: Negative.        Objective:   Physical Exam  Constitutional: She appears well-developed and well-nourished.  HENT:  Right Ear: External ear normal.  Left Ear: External ear normal.  Nose: Nose normal.  Mouth/Throat: Oropharynx is clear and moist.  Eyes: Conjunctivae are normal.  Neck: No thyromegaly present.  Pulmonary/Chest: Effort normal and breath sounds normal. No respiratory distress. She has no wheezes. She has no rales.  Lymphadenopathy:    She has no cervical adenopathy.          Assessment & Plan:  Probable strep throat. Cover with Keflex.

## 2014-08-20 ENCOUNTER — Encounter: Payer: Self-pay | Admitting: Family Medicine

## 2014-08-20 ENCOUNTER — Telehealth: Payer: Self-pay | Admitting: Family Medicine

## 2014-08-20 NOTE — Telephone Encounter (Signed)
Pt would like to have her labs for [facility] physical [facility]. Will you please put order in for her to do this

## 2014-08-21 ENCOUNTER — Other Ambulatory Visit: Payer: Self-pay | Admitting: Family Medicine

## 2014-08-21 DIAGNOSIS — E119 Type 2 diabetes mellitus without complications: Secondary | ICD-10-CM

## 2014-08-21 DIAGNOSIS — Z0000 Encounter for general adult medical examination without abnormal findings: Secondary | ICD-10-CM

## 2014-08-21 NOTE — Telephone Encounter (Signed)
I put in future lab order for [facility] location and spoke with pt.

## 2014-08-21 NOTE — Telephone Encounter (Signed)
Okay to order usual cpx labs for Z00.0 and an A1c for E11.9

## 2014-08-21 NOTE — Telephone Encounter (Signed)
Please [facility]. Order CPX labs for Z00.0 and an A1c for E11.9

## 2014-09-02 ENCOUNTER — Telehealth: Payer: Self-pay

## 2014-09-02 NOTE — Telephone Encounter (Signed)
[facility] - [place], [place] - [address]: PHENobarbital (LUMINAL) 97.2 MG tablet

## 2014-09-03 NOTE — Telephone Encounter (Signed)
Call in #180 with one rf  

## 2014-09-04 MED ORDER — PHENOBARBITAL 97.2 MG PO TABS: ORAL_TABLET | ORAL | Status: DC

## 2014-09-04 NOTE — Telephone Encounter (Signed)
Rx printed and faxed to [facility].  Fax confirmation received as well.

## 2014-09-06 ENCOUNTER — Other Ambulatory Visit: Payer: Self-pay | Admitting: Family Medicine

## 2014-09-07 ENCOUNTER — Encounter: Payer: Self-pay | Admitting: Family Medicine

## 2014-09-15 ENCOUNTER — Other Ambulatory Visit (INDEPENDENT_AMBULATORY_CARE_PROVIDER_SITE_OTHER): Payer: 59

## 2014-09-15 DIAGNOSIS — Z0000 Encounter for general adult medical examination without abnormal findings: Secondary | ICD-10-CM | POA: Diagnosis not present

## 2014-09-15 DIAGNOSIS — E119 Type 2 diabetes mellitus without complications: Secondary | ICD-10-CM

## 2014-09-15 LAB — HEPATIC FUNCTION PANEL
ALK PHOS: 38 U/L — AB (ref 39–117)
ALT: 21 U/L (ref 0–35)
AST: 16 U/L (ref 0–37)
Albumin: 4.2 g/dL (ref 3.5–5.2)
BILIRUBIN DIRECT: 0 mg/dL (ref 0.0–0.3)
Total Bilirubin: 0.4 mg/dL (ref 0.2–1.2)
Total Protein: 7 g/dL (ref 6.0–8.3)

## 2014-09-15 LAB — URINALYSIS
Bilirubin Urine: NEGATIVE
Hgb urine dipstick: NEGATIVE
KETONES UR: NEGATIVE
LEUKOCYTES UA: NEGATIVE
Nitrite: NEGATIVE
SPECIFIC GRAVITY, URINE: 1.02 (ref 1.000–1.030)
Total Protein, Urine: NEGATIVE
UROBILINOGEN UA: 0.2 (ref 0.0–1.0)
Urine Glucose: NEGATIVE
pH: 6 (ref 5.0–8.0)

## 2014-09-15 LAB — CBC WITH DIFFERENTIAL/PLATELET
Basophils Absolute: 0 10*3/uL (ref 0.0–0.1)
Basophils Relative: 0.6 % (ref 0.0–3.0)
EOS ABS: 0.1 10*3/uL (ref 0.0–0.7)
EOS PCT: 1.1 % (ref 0.0–5.0)
HCT: 40.4 % (ref 36.0–46.0)
Hemoglobin: 13.6 g/dL (ref 12.0–15.0)
Lymphocytes Relative: 31 % (ref 12.0–46.0)
Lymphs Abs: 2.1 10*3/uL (ref 0.7–4.0)
MCHC: 33.6 g/dL (ref 30.0–36.0)
MCV: 90.4 fl (ref 78.0–100.0)
MONO ABS: 0.5 10*3/uL (ref 0.1–1.0)
Monocytes Relative: 7.5 % (ref 3.0–12.0)
NEUTROS PCT: 59.8 % (ref 43.0–77.0)
Neutro Abs: 4.1 10*3/uL (ref 1.4–7.7)
PLATELETS: 314 10*3/uL (ref 150.0–400.0)
RBC: 4.47 Mil/uL (ref 3.87–5.11)
RDW: 12.1 % (ref 11.5–15.5)
WBC: 6.9 10*3/uL (ref 4.0–10.5)

## 2014-09-15 LAB — BASIC METABOLIC PANEL
BUN: 13 mg/dL (ref 6–23)
CHLORIDE: 100 meq/L (ref 96–112)
CO2: 27 mEq/L (ref 19–32)
Calcium: 9.6 mg/dL (ref 8.4–10.5)
Creatinine, Ser: 0.66 mg/dL (ref 0.40–1.20)
GFR: 104.63 mL/min (ref >60.00)
Glucose, Bld: 131 mg/dL — ABNORMAL HIGH (ref 70–99)
POTASSIUM: 3.5 meq/L (ref 3.5–5.1)
Sodium: 139 mEq/L (ref 135–145)

## 2014-09-15 LAB — LIPID PANEL
CHOLESTEROL: 164 mg/dL (ref 0–200)
HDL: 36 mg/dL — ABNORMAL LOW (ref >39.00)
NonHDL: 128
Total CHOL/HDL Ratio: 5
Triglycerides: 232 mg/dL — ABNORMAL HIGH (ref 0.0–149.0)
VLDL: 46.4 mg/dL — ABNORMAL HIGH (ref 0.0–40.0)

## 2014-09-15 LAB — HEMOGLOBIN A1C: Hgb A1c MFr Bld: 5.8 % (ref 4.6–6.5)

## 2014-09-15 LAB — LDL CHOLESTEROL, DIRECT: LDL DIRECT: 96 mg/dL

## 2014-09-15 LAB — TSH: TSH: 4.03 u[IU]/mL (ref 0.35–4.50)

## 2014-09-19 ENCOUNTER — Other Ambulatory Visit: Payer: Self-pay | Admitting: Family Medicine

## 2014-09-30 ENCOUNTER — Encounter: Payer: Self-pay | Admitting: Family Medicine

## 2014-09-30 ENCOUNTER — Ambulatory Visit (INDEPENDENT_AMBULATORY_CARE_PROVIDER_SITE_OTHER): Payer: 59 | Admitting: Family Medicine

## 2014-09-30 VITALS — BP 110/72 | HR 82 | Temp 98.7°F | Ht 70.0 in

## 2014-09-30 DIAGNOSIS — Z0000 Encounter for general adult medical examination without abnormal findings: Secondary | ICD-10-CM | POA: Diagnosis not present

## 2014-09-30 MED ORDER — FUROSEMIDE 20 MG PO TABS: 20.0000 mg | ORAL_TABLET | Freq: Every day | ORAL | Status: DC

## 2014-09-30 NOTE — Progress Notes (Signed)
Pre visit review using our clinic review tool, if applicable. No additional management support is needed unless otherwise documented below in the visit note. Pt declined to weigh 

## 2014-09-30 NOTE — Progress Notes (Signed)
   Subjective:    Patient ID: Lauren Espinoza, female    DOB: [birth year], 44 y.o.   MRN: [id number]  HPI 44 yr old female for a cpx. She is doing well except she has been having more leg swelling this summer than usual. She wears support stockings every day. She is taking HCTZ.    Review of Systems  Constitutional: Negative.   HENT: Negative.   Eyes: Negative.   Respiratory: Negative.   Cardiovascular: Positive for leg swelling. Negative for chest pain and palpitations.  Gastrointestinal: Negative.   Genitourinary: Negative for dysuria, urgency, frequency, hematuria, flank pain, decreased urine volume, enuresis, difficulty urinating, pelvic pain and dyspareunia.  Musculoskeletal: Negative.   Skin: Negative.   Neurological: Negative.   Psychiatric/Behavioral: Negative.        Objective:   Physical Exam  Constitutional: No distress.  Obese   Musculoskeletal: Normal range of motion. She exhibits no tenderness.  2+ edema in the lower legs and feet           Assessment & Plan:  Well exam. We reviewed diet and exercise advice. Switch from HCTZ to Lasix 20 mg daily.

## 2014-10-23 ENCOUNTER — Ambulatory Visit (INDEPENDENT_AMBULATORY_CARE_PROVIDER_SITE_OTHER): Payer: 59 | Admitting: Family Medicine

## 2014-10-23 ENCOUNTER — Encounter: Payer: Self-pay | Admitting: Family Medicine

## 2014-10-23 VITALS — BP 110/80 | HR 84 | Temp 98.2°F

## 2014-10-23 DIAGNOSIS — B084 Enteroviral vesicular stomatitis with exanthem: Secondary | ICD-10-CM | POA: Diagnosis not present

## 2014-10-23 NOTE — Progress Notes (Signed)
Pre visit review using our clinic review tool, if applicable. No additional management support is needed unless otherwise documented below in the visit note. 

## 2014-10-23 NOTE — Patient Instructions (Signed)

## 2014-10-23 NOTE — Progress Notes (Signed)
   Subjective:    Patient ID: Lauren Espinoza, female    DOB: [birth year], 44 y.o.   MRN: [id number]  HPI  Patient seen with concern for possible hand-foot-and-mouth disease. Her daughter had low-grade fever last week but no rash. Patient on Sunday developed headache and sore throat and subsequently developed a few small lesions on her hand feet and mouth. These are already clearing. She's not had any fever since this past Tuesday. She took some leftover Keflex for her sore throat for a few days. She's not any cough. No nausea or vomiting. Generally feels well at  this time  Past Medical History  Diagnosis Date  . HYPERTENSION 04/20/2008  . BACK PAIN, UPPER 09/03/2008  . DVT, HX OF 04/20/2008  . Edema   . Left shoulder pain     sees Dr Chaney Malling  . Diabetes mellitus   . Hyperlipidemia   . SEIZURE DISORDER 04/20/2008    last seizure  1995   Past Surgical History  Procedure Laterality Date  . Cholecystectomy    . Cesarean section    . Endometrial ablation  08-09-11    per Dr. Henderson Cloud     reports that she has never smoked. She has never used smokeless tobacco. She reports that she does not drink alcohol or use illicit drugs. family history includes Diabetes in her father, maternal uncle, paternal grandfather, and paternal grandmother. No Known Allergies   Review of Systems  Constitutional: Negative for fever and chills.  Respiratory: Negative for cough.   Gastrointestinal: Negative for nausea and vomiting.       Objective:   Physical Exam  Constitutional: She appears well-developed and well-nourished.  HENT:  Oropharynx clear with exception of one small vesicular-type lesion on the lateral aspect left side of tongue  Neck: Neck supple.  Cardiovascular: Normal rate and regular rhythm.   Pulmonary/Chest: Effort normal and breath sounds normal. No respiratory distress. She has no wheezes. She has no rales.  Lymphadenopathy:    She has no cervical adenopathy.  Skin:  She has a couple  small vesicles on her feet that appear to be clearing. No significant hand rash          Assessment & Plan:  Probable hand-foot-and-mouth coxsackie infection. Clinically seems to be improving. Reassurance

## 2014-12-19 ENCOUNTER — Other Ambulatory Visit: Payer: Self-pay | Admitting: Family Medicine

## 2014-12-21 ENCOUNTER — Other Ambulatory Visit: Payer: Self-pay

## 2015-01-11 LAB — HM DIABETES EYE EXAM

## 2015-01-25 ENCOUNTER — Encounter: Payer: Self-pay | Admitting: Family Medicine

## 2015-02-24 ENCOUNTER — Ambulatory Visit (INDEPENDENT_AMBULATORY_CARE_PROVIDER_SITE_OTHER): Payer: 59 | Admitting: Family Medicine

## 2015-02-24 ENCOUNTER — Encounter: Payer: Self-pay | Admitting: Family Medicine

## 2015-02-24 VITALS — BP 109/75 | HR 87 | Temp 98.5°F

## 2015-02-24 DIAGNOSIS — R0789 Other chest pain: Secondary | ICD-10-CM | POA: Diagnosis not present

## 2015-02-24 DIAGNOSIS — J069 Acute upper respiratory infection, unspecified: Secondary | ICD-10-CM

## 2015-02-24 DIAGNOSIS — S39012A Strain of muscle, fascia and tendon of lower back, initial encounter: Secondary | ICD-10-CM | POA: Diagnosis not present

## 2015-02-24 DIAGNOSIS — M549 Dorsalgia, unspecified: Secondary | ICD-10-CM

## 2015-02-24 LAB — POCT URINALYSIS DIPSTICK
BILIRUBIN UA: NEGATIVE
Blood, UA: NEGATIVE
Glucose, UA: NEGATIVE
Ketones, UA: NEGATIVE
Leukocytes, UA: NEGATIVE
Nitrite, UA: NEGATIVE
PH UA: 6
PROTEIN UA: NEGATIVE
Spec Grav, UA: 1.025
Urobilinogen, UA: 1

## 2015-02-24 NOTE — Addendum Note (Signed)
Addended by: Aniceto BossNIMMONS, Alpa Salvo A on: 02/24/2015 11:00 AM   Modules accepted: Orders

## 2015-02-24 NOTE — Progress Notes (Signed)
   Subjective:    Patient ID: Lauren Espinoza, female    DOB: 11/09/1970, 44 y.o.   MRN: [id number]  HPI Here for 2 days of several symptoms. First she has sharp low back pains and wants to make sure she does not have a UTI. No urgency or burning. Also she has a dry cough but no fever. Her child as had croup this week. She also has a sharp right anterior chest pain. No sweats or nausea or SOB. She notes that she has been working hard this week getting decorations down from her attic and putting them up.    Review of Systems  Constitutional: Negative.   HENT: Negative.   Eyes: Negative.   Respiratory: Positive for cough. Negative for chest tightness and shortness of breath.   Cardiovascular: Positive for chest pain. Negative for palpitations and leg swelling.  Genitourinary: Negative.        Objective:   Physical Exam  Constitutional: She appears well-developed and well-nourished.  HENT:  Right Ear: External ear normal.  Left Ear: External ear normal.  Nose: Nose normal.  Mouth/Throat: Oropharynx is clear and moist.  Eyes: Conjunctivae are normal.  Neck: No thyromegaly present.  Cardiovascular: Normal rate, regular rhythm, normal heart sounds and intact distal pulses.   Pulmonary/Chest: Effort normal and breath sounds normal.  Tender over the right anterior chest   Abdominal: Soft. Bowel sounds are normal. She exhibits no distension and no mass. There is no tenderness. There is no rebound and no guarding.  Musculoskeletal:  Tender over the lower back with full ROM   Lymphadenopathy:    She has no cervical adenopathy.          Assessment & Plan:  She has back pain and chest pain, both of which are muscular and are the result of the bending and lifting and reaching she has been doing. She does not have a UTI. She has an early viral chest could, so she can use Robitussin prn.

## 2015-02-24 NOTE — Progress Notes (Signed)
Pre visit review using our clinic review tool, if applicable. No additional management support is needed unless otherwise documented below in the visit note. Pt declined to weigh 

## 2015-03-04 ENCOUNTER — Other Ambulatory Visit: Payer: Self-pay | Admitting: Family Medicine

## 2015-03-04 MED ORDER — METFORMIN HCL 500 MG PO TABS: 500.0000 mg | ORAL_TABLET | Freq: Two times a day (BID) | ORAL | Status: DC

## 2015-03-23 ENCOUNTER — Encounter: Payer: Self-pay | Admitting: Family Medicine

## 2015-03-24 ENCOUNTER — Encounter: Payer: Self-pay | Admitting: Family Medicine

## 2015-03-24 MED ORDER — PHENOBARBITAL 97.2 MG PO TABS: ORAL_TABLET | ORAL | Status: DC

## 2015-03-24 NOTE — Telephone Encounter (Signed)
Ready to be faxed.

## 2015-03-24 NOTE — Telephone Encounter (Signed)
Rx faxed

## 2015-03-25 MED ORDER — PHENOBARBITAL 97.2 MG PO TABS: ORAL_TABLET | ORAL | Status: DC

## 2015-05-05 ENCOUNTER — Other Ambulatory Visit: Payer: Self-pay

## 2015-05-05 ENCOUNTER — Encounter: Payer: Self-pay | Admitting: Family Medicine

## 2015-05-05 MED ORDER — HYDROCHLOROTHIAZIDE 25 MG PO TABS: 25.0000 mg | ORAL_TABLET | Freq: Every day | ORAL | Status: DC

## 2015-05-05 MED ORDER — LISINOPRIL 20 MG PO TABS: 20.0000 mg | ORAL_TABLET | Freq: Every day | ORAL | Status: DC

## 2015-05-05 MED ORDER — METFORMIN HCL 500 MG PO TABS: 500.0000 mg | ORAL_TABLET | Freq: Two times a day (BID) | ORAL | Status: DC

## 2015-05-31 ENCOUNTER — Other Ambulatory Visit: Payer: Self-pay | Admitting: Family Medicine

## 2015-06-01 ENCOUNTER — Encounter: Payer: Self-pay | Admitting: Family Medicine

## 2015-06-01 ENCOUNTER — Ambulatory Visit (INDEPENDENT_AMBULATORY_CARE_PROVIDER_SITE_OTHER): Payer: 59 | Admitting: Family Medicine

## 2015-06-01 VITALS — BP 103/73 | HR 92 | Temp 98.7°F

## 2015-06-01 DIAGNOSIS — J019 Acute sinusitis, unspecified: Secondary | ICD-10-CM

## 2015-06-01 DIAGNOSIS — R509 Fever, unspecified: Secondary | ICD-10-CM

## 2015-06-01 LAB — POCT INFLUENZA A: RAPID INFLUENZA A AGN: NEGATIVE

## 2015-06-01 MED ORDER — AZITHROMYCIN 250 MG PO TABS: ORAL_TABLET | ORAL | Status: DC

## 2015-06-01 NOTE — Progress Notes (Signed)
Pre visit review using our clinic review tool, if applicable. No additional management support is needed unless otherwise documented below in the visit note. Pt declined to weigh 

## 2015-06-01 NOTE — Progress Notes (Signed)
   Subjective:    Patient ID: Lauren Espinoza, female    DOB: [birth year], 45 y.o.   MRN: [id number]  HPI Here for 3 weeks of sinus pressure, PND, ST, and a dry cough. No fever. On Sudafed.    Review of Systems  Constitutional: Negative.   HENT: Positive for congestion, postnasal drip, sinus pressure and sore throat.   Eyes: Negative.   Respiratory: Positive for cough.        Objective:   Physical Exam  Constitutional: She appears well-developed and well-nourished.  HENT:  Right Ear: External ear normal.  Left Ear: External ear normal.  Nose: Nose normal.  Mouth/Throat: Oropharynx is clear and moist.  Eyes: Conjunctivae are normal.  Neck: Neck supple.  Pulmonary/Chest: Effort normal and breath sounds normal.  Lymphadenopathy:    She has no cervical adenopathy.          Assessment & Plan:  Sinusitis, treat with a Zpack.

## 2015-06-04 ENCOUNTER — Encounter: Payer: Self-pay | Admitting: Family Medicine

## 2015-06-04 MED ORDER — HYDROCODONE-HOMATROPINE 5-1.5 MG/5ML PO SYRP: 5.0000 mL | ORAL_SOLUTION | ORAL | Status: DC | PRN

## 2015-06-04 NOTE — Telephone Encounter (Signed)
done

## 2015-06-08 ENCOUNTER — Telehealth: Payer: Self-pay | Admitting: Family Medicine

## 2015-06-08 MED ORDER — AMOXICILLIN-POT CLAVULANATE 875-125 MG PO TABS: 1.0000 | ORAL_TABLET | Freq: Two times a day (BID) | ORAL | Status: DC

## 2015-06-08 NOTE — Telephone Encounter (Signed)
Actually I asked to call in a round of Augmentin instead

## 2015-06-08 NOTE — Telephone Encounter (Signed)
Patient is requesting another refill on a z pack to be called in due her sinus infection not being cleared up.   Pharmacy: [facility]

## 2015-06-08 NOTE — Telephone Encounter (Signed)
Call in Augmentin [phone] bid for 10 days  

## 2015-06-08 NOTE — Telephone Encounter (Signed)
Rx sent and Left message on machine for patient. 

## 2015-06-24 ENCOUNTER — Encounter: Payer: Self-pay | Admitting: Family Medicine

## 2015-06-24 DIAGNOSIS — E538 Deficiency of other specified B group vitamins: Secondary | ICD-10-CM

## 2015-06-24 DIAGNOSIS — E559 Vitamin D deficiency, unspecified: Secondary | ICD-10-CM

## 2015-06-24 DIAGNOSIS — R569 Unspecified convulsions: Secondary | ICD-10-CM

## 2015-06-24 DIAGNOSIS — E119 Type 2 diabetes mellitus without complications: Secondary | ICD-10-CM

## 2015-06-24 NOTE — Telephone Encounter (Signed)
I put in future orders for all these

## 2015-09-16 ENCOUNTER — Other Ambulatory Visit: Payer: Self-pay | Admitting: Family Medicine

## 2015-09-16 NOTE — Telephone Encounter (Signed)
Call in #180 with one rf  

## 2015-09-17 MED ORDER — PHENOBARBITAL 97.2 MG PO TABS: ORAL_TABLET | ORAL | Status: DC

## 2015-09-17 NOTE — Telephone Encounter (Signed)
Refill sent to pharmacy.   

## 2015-09-28 ENCOUNTER — Other Ambulatory Visit (INDEPENDENT_AMBULATORY_CARE_PROVIDER_SITE_OTHER): Payer: 59

## 2015-09-28 DIAGNOSIS — E559 Vitamin D deficiency, unspecified: Secondary | ICD-10-CM | POA: Diagnosis not present

## 2015-09-28 DIAGNOSIS — E119 Type 2 diabetes mellitus without complications: Secondary | ICD-10-CM

## 2015-09-28 DIAGNOSIS — R569 Unspecified convulsions: Secondary | ICD-10-CM

## 2015-09-28 DIAGNOSIS — Z0000 Encounter for general adult medical examination without abnormal findings: Secondary | ICD-10-CM

## 2015-09-28 DIAGNOSIS — E538 Deficiency of other specified B group vitamins: Secondary | ICD-10-CM | POA: Diagnosis not present

## 2015-09-28 LAB — BASIC METABOLIC PANEL
BUN: 15 mg/dL (ref 6–23)
CHLORIDE: 101 meq/L (ref 96–112)
CO2: 29 meq/L (ref 19–32)
CREATININE: 0.69 mg/dL (ref 0.40–1.20)
Calcium: 9.2 mg/dL (ref 8.4–10.5)
GFR: 98.9 mL/min (ref >60.00)
Glucose, Bld: 129 mg/dL — ABNORMAL HIGH (ref 70–99)
POTASSIUM: 3.8 meq/L (ref 3.5–5.1)
SODIUM: 138 meq/L (ref 135–145)

## 2015-09-28 LAB — POC URINALSYSI DIPSTICK (AUTOMATED)
Bilirubin, UA: NEGATIVE
Glucose, UA: NEGATIVE
Ketones, UA: NEGATIVE
Leukocytes, UA: NEGATIVE
NITRITE UA: NEGATIVE
PH UA: 5.5
Protein, UA: NEGATIVE
RBC UA: NEGATIVE
Spec Grav, UA: 1.02
UROBILINOGEN UA: 1

## 2015-09-28 LAB — MICROALBUMIN / CREATININE URINE RATIO
CREATININE, U: 203.9 mg/dL
MICROALB UR: 1.1 mg/dL (ref 0.0–1.9)
Microalb Creat Ratio: 0.5 mg/g (ref 0.0–30.0)

## 2015-09-28 LAB — LIPID PANEL
Cholesterol: 182 mg/dL (ref 0–200)
HDL: 35.5 mg/dL — ABNORMAL LOW (ref >39.00)
LDL Cholesterol: 110 mg/dL — ABNORMAL HIGH (ref 0–99)
NONHDL: 146.63
Total CHOL/HDL Ratio: 5
Triglycerides: 185 mg/dL — ABNORMAL HIGH (ref 0.0–149.0)
VLDL: 37 mg/dL (ref 0.0–40.0)

## 2015-09-28 LAB — CBC WITH DIFFERENTIAL/PLATELET
BASOS PCT: 0.8 % (ref 0.0–3.0)
Basophils Absolute: 0 10*3/uL (ref 0.0–0.1)
EOS ABS: 0.2 10*3/uL (ref 0.0–0.7)
EOS PCT: 3.5 % (ref 0.0–5.0)
HCT: 38.7 % (ref 36.0–46.0)
HEMOGLOBIN: 13.1 g/dL (ref 12.0–15.0)
LYMPHS PCT: 33.1 % (ref 12.0–46.0)
Lymphs Abs: 1.7 10*3/uL (ref 0.7–4.0)
MCHC: 33.8 g/dL (ref 30.0–36.0)
MCV: 89.6 fl (ref 78.0–100.0)
MONOS PCT: 6.9 % (ref 3.0–12.0)
Monocytes Absolute: 0.3 10*3/uL (ref 0.1–1.0)
Neutro Abs: 2.8 10*3/uL (ref 1.4–7.7)
Neutrophils Relative %: 55.7 % (ref 43.0–77.0)
Platelets: 289 10*3/uL (ref 150.0–400.0)
RBC: 4.32 Mil/uL (ref 3.87–5.11)
RDW: 12.3 % (ref 11.5–15.5)
WBC: 5.1 10*3/uL (ref 4.0–10.5)

## 2015-09-28 LAB — HEPATIC FUNCTION PANEL
ALBUMIN: 4.1 g/dL (ref 3.5–5.2)
ALK PHOS: 32 U/L — AB (ref 39–117)
ALT: 17 U/L (ref 0–35)
AST: 14 U/L (ref 0–37)
Bilirubin, Direct: 0.1 mg/dL (ref 0.0–0.3)
TOTAL PROTEIN: 6.4 g/dL (ref 6.0–8.3)
Total Bilirubin: 0.3 mg/dL (ref 0.2–1.2)

## 2015-09-28 LAB — VITAMIN D 25 HYDROXY (VIT D DEFICIENCY, FRACTURES): VITD: 34.91 ng/mL (ref 30.00–100.00)

## 2015-09-28 LAB — T3, FREE: T3, Free: 3.1 pg/mL (ref 2.3–4.2)

## 2015-09-28 LAB — VITAMIN B12: Vitamin B-12: 280 pg/mL (ref 211–911)

## 2015-09-28 LAB — T4, FREE: FREE T4: 0.67 ng/dL (ref 0.60–1.60)

## 2015-09-28 LAB — HEMOGLOBIN A1C: HEMOGLOBIN A1C: 5.9 % (ref 4.6–6.5)

## 2015-09-28 LAB — TSH: TSH: 3.48 u[IU]/mL (ref 0.35–4.50)

## 2015-09-29 LAB — PHENOBARBITAL LEVEL: PHENOBARBITAL: 19.8 mg/L (ref 15.0–40.0)

## 2015-10-04 ENCOUNTER — Encounter: Payer: Self-pay | Admitting: Family Medicine

## 2015-10-04 ENCOUNTER — Ambulatory Visit (INDEPENDENT_AMBULATORY_CARE_PROVIDER_SITE_OTHER): Payer: 59 | Admitting: Family Medicine

## 2015-10-04 VITALS — BP 102/73 | HR 77 | Temp 98.3°F | Ht 70.0 in

## 2015-10-04 DIAGNOSIS — Z0000 Encounter for general adult medical examination without abnormal findings: Secondary | ICD-10-CM | POA: Diagnosis not present

## 2015-10-04 MED ORDER — ATORVASTATIN CALCIUM 40 MG PO TABS: 40.0000 mg | ORAL_TABLET | Freq: Every day | ORAL | Status: DC

## 2015-10-04 MED ORDER — METFORMIN HCL 500 MG PO TABS: 500.0000 mg | ORAL_TABLET | Freq: Two times a day (BID) | ORAL | Status: DC

## 2015-10-04 MED ORDER — DIAZEPAM 5 MG PO TABS: 5.0000 mg | ORAL_TABLET | Freq: Two times a day (BID) | ORAL | Status: DC | PRN

## 2015-10-04 NOTE — Progress Notes (Signed)
   Subjective:    Patient ID: Lauren Espinoza, female    DOB: Mar 03, 1971, 45 y.o.   MRN: [id number]  HPI 45 yr old female for a well exam. She feels well.    Review of Systems  Constitutional: Negative.   HENT: Negative.   Eyes: Negative.   Respiratory: Negative.   Cardiovascular: Negative.   Gastrointestinal: Negative.   Genitourinary: Negative for dysuria, urgency, frequency, hematuria, flank pain, decreased urine volume, enuresis, difficulty urinating, pelvic pain and dyspareunia.  Musculoskeletal: Negative.   Skin: Negative.   Neurological: Negative.   Psychiatric/Behavioral: Negative.        Objective:   Physical Exam  Constitutional: She is oriented to person, place, and time. No distress.  Obese   HENT:  Head: Normocephalic and atraumatic.  Right Ear: External ear normal.  Left Ear: External ear normal.  Nose: Nose normal.  Mouth/Throat: Oropharynx is clear and moist. No oropharyngeal exudate.  Eyes: Conjunctivae and EOM are normal. Pupils are equal, round, and reactive to light. No scleral icterus.  Neck: Normal range of motion. Neck supple. No JVD present. No thyromegaly present.  Cardiovascular: Normal rate, regular rhythm, normal heart sounds and intact distal pulses.  Exam reveals no gallop and no friction rub.   No murmur heard. Pulmonary/Chest: Effort normal and breath sounds normal. No respiratory distress. She has no wheezes. She has no rales. She exhibits no tenderness.  Abdominal: Soft. Bowel sounds are normal. She exhibits no distension and no mass. There is no tenderness. There is no rebound and no guarding.  Musculoskeletal: Normal range of motion. She exhibits no edema or tenderness.  Lymphadenopathy:    She has no cervical adenopathy.  Neurological: She is alert and oriented to person, place, and time. She has normal reflexes. No cranial nerve deficit. She exhibits normal muscle tone. Coordination normal.  Skin: Skin is warm and dry. No rash noted. No  erythema.  Psychiatric: She has a normal mood and affect. Her behavior is normal. Judgment and thought content normal.          Assessment & Plan:  Well exam. We discussed diet and exercise. We will increase the Lipitor to 40 mg daily.  Nelwyn SalisburyFRY,STEPHEN A, MD

## 2015-10-04 NOTE — Progress Notes (Signed)
Pre visit review using our clinic review tool, if applicable. No additional management support is needed unless otherwise documented below in the visit note. Pt declined to weigh 

## 2015-12-09 DIAGNOSIS — Z01419 Encounter for gynecological examination (general) (routine) without abnormal findings: Secondary | ICD-10-CM | POA: Diagnosis not present

## 2015-12-09 DIAGNOSIS — Z1231 Encounter for screening mammogram for malignant neoplasm of breast: Secondary | ICD-10-CM | POA: Diagnosis not present

## 2016-01-14 ENCOUNTER — Encounter: Payer: Self-pay | Admitting: Family Medicine

## 2016-01-14 MED FILL — ATORVASTATIN 40 MG TABLET: 40 | 90 days supply | Qty: 90 | Fill #0

## 2016-01-18 MED ORDER — PHENOBARBITAL 97.2 MG PO TABS: ORAL_TABLET | ORAL | 1 refills | Status: DC

## 2016-01-18 MED FILL — PHENobarbital 97.2 MG TABS: 97.2 | 90 days supply | Qty: 180 | Fill #0

## 2016-01-18 NOTE — Telephone Encounter (Signed)
Call in Phenobarb 97.2 mg to take bid, #[phone] with one rf, to [facility]

## 2016-02-02 ENCOUNTER — Other Ambulatory Visit: Payer: Self-pay | Admitting: Family Medicine

## 2016-03-08 ENCOUNTER — Other Ambulatory Visit: Payer: Self-pay | Admitting: Family Medicine

## 2016-03-09 ENCOUNTER — Other Ambulatory Visit: Payer: Self-pay | Admitting: Family Medicine

## 2016-05-02 ENCOUNTER — Other Ambulatory Visit: Payer: Self-pay | Admitting: Family Medicine

## 2016-05-02 MED FILL — ATORVASTATIN 40 MG TABLET: 40 | 90 days supply | Qty: 90 | Fill #1

## 2016-05-02 MED FILL — HYDROCHLOROTHIAZIDE 25 MG T: 25 | 90 days supply | Qty: 90 | Fill #0

## 2016-05-02 MED FILL — PHENobarbital 97.2 MG TABS: 97.2 | 90 days supply | Qty: 180 | Fill #1

## 2016-05-02 MED FILL — metFORMIN HCL 500 MG TABS: 500 | 90 days supply | Qty: 180 | Fill #0

## 2016-05-02 MED FILL — LISINOPRIL 20 MG TABLET: 20 | 90 days supply | Qty: 90 | Fill #0

## 2016-05-05 ENCOUNTER — Other Ambulatory Visit: Payer: Self-pay | Admitting: Family Medicine

## 2016-05-05 ENCOUNTER — Encounter: Payer: Self-pay | Admitting: Family Medicine

## 2016-05-05 MED ORDER — OSELTAMIVIR PHOSPHATE 75 MG PO CAPS: 75.0000 mg | ORAL_CAPSULE | Freq: Two times a day (BID) | ORAL | 0 refills | Status: DC

## 2016-05-05 NOTE — Telephone Encounter (Signed)
Call in Tamiflu 75 mg bid for 5 days  

## 2016-05-05 NOTE — Telephone Encounter (Signed)
I sent script e-scribe to [facility] and spoke with pt.  

## 2016-05-11 DIAGNOSIS — M792 Neuralgia and neuritis, unspecified: Secondary | ICD-10-CM | POA: Diagnosis not present

## 2016-05-11 DIAGNOSIS — L858 Other specified epidermal thickening: Secondary | ICD-10-CM | POA: Diagnosis not present

## 2016-05-18 DIAGNOSIS — M546 Pain in thoracic spine: Secondary | ICD-10-CM | POA: Diagnosis not present

## 2016-05-18 DIAGNOSIS — M545 Low back pain: Secondary | ICD-10-CM | POA: Diagnosis not present

## 2016-05-18 DIAGNOSIS — M47816 Spondylosis without myelopathy or radiculopathy, lumbar region: Secondary | ICD-10-CM | POA: Diagnosis not present

## 2016-05-18 MED FILL — tiZANidine HCL 4 MG TABS: 4 | 30 days supply | Qty: 30 | Fill #0

## 2016-06-11 ENCOUNTER — Ambulatory Visit (INDEPENDENT_AMBULATORY_CARE_PROVIDER_SITE_OTHER): Payer: Self-pay | Admitting: Family

## 2016-06-11 VITALS — BP 98/68 | HR 95 | Temp 99.1°F | Resp 18 | Ht 70.0 in | Wt 285.0 lb

## 2016-06-11 DIAGNOSIS — J019 Acute sinusitis, unspecified: Secondary | ICD-10-CM

## 2016-06-11 DIAGNOSIS — B9689 Other specified bacterial agents as the cause of diseases classified elsewhere: Secondary | ICD-10-CM

## 2016-06-11 MED ORDER — AMOXICILLIN-POT CLAVULANATE 875-125 MG PO TABS: 1.0000 | ORAL_TABLET | Freq: Two times a day (BID) | ORAL | 0 refills | Status: DC

## 2016-06-11 NOTE — Patient Instructions (Signed)

## 2016-06-11 NOTE — Progress Notes (Signed)
Subjective:     Patient ID: Lauren Espinoza, female   DOB: [birth year], 46 y.o.   MRN: [id number]  HPI 46 year old female is in today with c/o sinus pressure, cough, low grade fever, x 1 week and worsening.She has been taking Sudafed that has helped some. Daughter at home sick with ear infection and URI  Review of Systems Past Medical History:  Diagnosis Date  . BACK PAIN, UPPER 09/03/2008  . Diabetes mellitus   . DVT, HX OF 04/20/2008  . Edema   . Hyperlipidemia   . HYPERTENSION 04/20/2008  . Left shoulder pain    sees Dr Chaney MallingMortenson  . SEIZURE DISORDER 04/20/2008   last seizure  1995    Social History   Social History  . Marital status: Married    Spouse name: N/A  . Number of children: N/A  . Years of education: N/A   Occupational History  . accounting  supervisor  [employer]   Social History Main Topics  . Smoking status: Never Smoker  . Smokeless tobacco: Never Used  . Alcohol use No  . Drug use: No  . Sexual activity: Not on file   Other Topics Concern  . Not on file   Social History Narrative  . No narrative on file    Past Surgical History:  Procedure Laterality Date  . CESAREAN SECTION    . CHOLECYSTECTOMY    . ENDOMETRIAL ABLATION  08-09-11   per Dr. Henderson CloudHorvath     Family History  Problem Relation Age of Onset  . Diabetes Father   . Diabetes Maternal Uncle   . Diabetes Paternal Grandmother   . Diabetes Paternal Grandfather     No Known Allergies  Current Outpatient Prescriptions on File Prior to Visit  Medication Sig Dispense Refill  . aspirin 325 MG tablet Take 325 mg by mouth daily.    Marland Kitchen. atorvastatin (LIPITOR) 40 MG tablet Take 1 tablet (40 mg total) by mouth daily. 90 tablet 3  . diazepam (VALIUM) 5 MG tablet Take 1 tablet (5 mg total) by mouth every 12 (twelve) hours as needed for anxiety. 180 tablet 1  . hydrochlorothiazide (HYDRODIURIL) 25 MG tablet TAKE 1 TABLET BY MOUTH DAILY 90 tablet 2  . lisinopril (PRINIVIL,ZESTRIL) 20 MG tablet TAKE 1  TABLET BY MOUTH  DAILY 90 tablet 3  . metFORMIN (GLUCOPHAGE) 500 MG tablet TAKE 1 TABLET BY MOUTH TWICE DAILY WITH A MEAL 180 tablet 1  . Omega-3 Fatty Acids (FISH OIL PO) Take by mouth 2 (two) times daily.    Marland Kitchen. PHENobarbital (LUMINAL) 97.2 MG tablet TAKE 1 TABLET BY MOUTH TWICE DAILY 180 tablet 1   No current facility-administered medications on file prior to visit.     BP 98/68 (BP Location: Right Arm, Patient Position: Sitting, Cuff Size: Large)   Pulse 95   Temp 99.1 F (37.3 C) (Oral)   Resp 18   Ht 5\' 10"  (1.778 m)   Wt 285 lb (129.3 kg)   SpO2 97%   BMI 40.89 kg/m chart    Objective:   Physical Exam  Constitutional: She is oriented to person, place, and time. She appears well-developed and well-nourished.  HENT:  Right Ear: External ear normal.  Left Ear: External ear normal.  Sinus tenderness to palpation of the maxillary sinuses. Nasal turbinates inflamed.  Neck: Normal range of motion. Neck supple.  Cardiovascular: Normal rate, regular rhythm and normal heart sounds.   Pulmonary/Chest: Effort normal and breath sounds normal.  Musculoskeletal: Normal  range of motion.  Neurological: She is alert and oriented to person, place, and time.  Skin: Skin is warm and dry.  Psychiatric: She has a normal mood and affect.       Assessment:     Breanah was seen today for sinusitis.  Diagnoses and all orders for this visit:  Acute bacterial sinusitis  Other orders -     amoxicillin-clavulanate (AUGMENTIN) 875-125 MG tablet; Take 1 tablet by mouth 2 (two) times daily.      Plan:     Antihistamine OTC and cough medication recommended. Call the office with any questions or concerns. Recheck with PCP as scheduled/needed

## 2016-06-28 DIAGNOSIS — M545 Low back pain: Secondary | ICD-10-CM | POA: Diagnosis not present

## 2016-06-28 DIAGNOSIS — M7501 Adhesive capsulitis of right shoulder: Secondary | ICD-10-CM | POA: Diagnosis not present

## 2016-06-28 DIAGNOSIS — M47816 Spondylosis without myelopathy or radiculopathy, lumbar region: Secondary | ICD-10-CM | POA: Diagnosis not present

## 2016-07-17 DIAGNOSIS — M47816 Spondylosis without myelopathy or radiculopathy, lumbar region: Secondary | ICD-10-CM | POA: Diagnosis not present

## 2016-07-17 DIAGNOSIS — M4714 Other spondylosis with myelopathy, thoracic region: Secondary | ICD-10-CM | POA: Diagnosis not present

## 2016-07-17 DIAGNOSIS — M9905 Segmental and somatic dysfunction of pelvic region: Secondary | ICD-10-CM | POA: Diagnosis not present

## 2016-07-17 DIAGNOSIS — M9903 Segmental and somatic dysfunction of lumbar region: Secondary | ICD-10-CM | POA: Diagnosis not present

## 2016-07-17 DIAGNOSIS — M5137 Other intervertebral disc degeneration, lumbosacral region: Secondary | ICD-10-CM | POA: Diagnosis not present

## 2016-07-17 DIAGNOSIS — M9902 Segmental and somatic dysfunction of thoracic region: Secondary | ICD-10-CM | POA: Diagnosis not present

## 2016-07-19 DIAGNOSIS — M5137 Other intervertebral disc degeneration, lumbosacral region: Secondary | ICD-10-CM | POA: Diagnosis not present

## 2016-07-19 DIAGNOSIS — M47816 Spondylosis without myelopathy or radiculopathy, lumbar region: Secondary | ICD-10-CM | POA: Diagnosis not present

## 2016-07-19 DIAGNOSIS — M9905 Segmental and somatic dysfunction of pelvic region: Secondary | ICD-10-CM | POA: Diagnosis not present

## 2016-07-19 DIAGNOSIS — M9903 Segmental and somatic dysfunction of lumbar region: Secondary | ICD-10-CM | POA: Diagnosis not present

## 2016-07-19 DIAGNOSIS — M9902 Segmental and somatic dysfunction of thoracic region: Secondary | ICD-10-CM | POA: Diagnosis not present

## 2016-07-19 DIAGNOSIS — M4714 Other spondylosis with myelopathy, thoracic region: Secondary | ICD-10-CM | POA: Diagnosis not present

## 2016-07-25 DIAGNOSIS — M4714 Other spondylosis with myelopathy, thoracic region: Secondary | ICD-10-CM | POA: Diagnosis not present

## 2016-07-25 DIAGNOSIS — M9902 Segmental and somatic dysfunction of thoracic region: Secondary | ICD-10-CM | POA: Diagnosis not present

## 2016-07-25 DIAGNOSIS — M47816 Spondylosis without myelopathy or radiculopathy, lumbar region: Secondary | ICD-10-CM | POA: Diagnosis not present

## 2016-07-25 DIAGNOSIS — M9903 Segmental and somatic dysfunction of lumbar region: Secondary | ICD-10-CM | POA: Diagnosis not present

## 2016-07-25 DIAGNOSIS — M9905 Segmental and somatic dysfunction of pelvic region: Secondary | ICD-10-CM | POA: Diagnosis not present

## 2016-07-25 DIAGNOSIS — M5137 Other intervertebral disc degeneration, lumbosacral region: Secondary | ICD-10-CM | POA: Diagnosis not present

## 2016-07-27 DIAGNOSIS — M9903 Segmental and somatic dysfunction of lumbar region: Secondary | ICD-10-CM | POA: Diagnosis not present

## 2016-07-27 DIAGNOSIS — M9902 Segmental and somatic dysfunction of thoracic region: Secondary | ICD-10-CM | POA: Diagnosis not present

## 2016-07-27 DIAGNOSIS — M5137 Other intervertebral disc degeneration, lumbosacral region: Secondary | ICD-10-CM | POA: Diagnosis not present

## 2016-07-27 DIAGNOSIS — M47816 Spondylosis without myelopathy or radiculopathy, lumbar region: Secondary | ICD-10-CM | POA: Diagnosis not present

## 2016-07-27 DIAGNOSIS — M9905 Segmental and somatic dysfunction of pelvic region: Secondary | ICD-10-CM | POA: Diagnosis not present

## 2016-07-27 DIAGNOSIS — M4714 Other spondylosis with myelopathy, thoracic region: Secondary | ICD-10-CM | POA: Diagnosis not present

## 2016-08-01 DIAGNOSIS — M9902 Segmental and somatic dysfunction of thoracic region: Secondary | ICD-10-CM | POA: Diagnosis not present

## 2016-08-01 DIAGNOSIS — M9905 Segmental and somatic dysfunction of pelvic region: Secondary | ICD-10-CM | POA: Diagnosis not present

## 2016-08-01 DIAGNOSIS — M47816 Spondylosis without myelopathy or radiculopathy, lumbar region: Secondary | ICD-10-CM | POA: Diagnosis not present

## 2016-08-01 DIAGNOSIS — M9903 Segmental and somatic dysfunction of lumbar region: Secondary | ICD-10-CM | POA: Diagnosis not present

## 2016-08-01 DIAGNOSIS — M5137 Other intervertebral disc degeneration, lumbosacral region: Secondary | ICD-10-CM | POA: Diagnosis not present

## 2016-08-01 DIAGNOSIS — M4714 Other spondylosis with myelopathy, thoracic region: Secondary | ICD-10-CM | POA: Diagnosis not present

## 2016-08-03 ENCOUNTER — Other Ambulatory Visit: Payer: Self-pay | Admitting: Family Medicine

## 2016-08-03 MED FILL — LISINOPRIL 20 MG TABLET: 20 | 90 days supply | Qty: 90 | Fill #1

## 2016-08-03 MED FILL — HYDROCHLOROTHIAZIDE 25 MG T: 25 | 90 days supply | Qty: 90 | Fill #1

## 2016-08-03 MED FILL — ATORVASTATIN 40 MG TABLET: 40 | 90 days supply | Qty: 90 | Fill #2

## 2016-08-03 MED FILL — PHENobarbital 97.2 MG TABS: 97.2 | 90 days supply | Qty: 180 | Fill #0

## 2016-08-03 NOTE — Telephone Encounter (Signed)
Call in #180 with one rf  

## 2016-08-04 DIAGNOSIS — M5137 Other intervertebral disc degeneration, lumbosacral region: Secondary | ICD-10-CM | POA: Diagnosis not present

## 2016-08-04 DIAGNOSIS — M47816 Spondylosis without myelopathy or radiculopathy, lumbar region: Secondary | ICD-10-CM | POA: Diagnosis not present

## 2016-08-04 DIAGNOSIS — M9903 Segmental and somatic dysfunction of lumbar region: Secondary | ICD-10-CM | POA: Diagnosis not present

## 2016-08-04 DIAGNOSIS — M9902 Segmental and somatic dysfunction of thoracic region: Secondary | ICD-10-CM | POA: Diagnosis not present

## 2016-08-04 DIAGNOSIS — M9905 Segmental and somatic dysfunction of pelvic region: Secondary | ICD-10-CM | POA: Diagnosis not present

## 2016-08-04 DIAGNOSIS — M4714 Other spondylosis with myelopathy, thoracic region: Secondary | ICD-10-CM | POA: Diagnosis not present

## 2016-08-07 DIAGNOSIS — H5203 Hypermetropia, bilateral: Secondary | ICD-10-CM | POA: Diagnosis not present

## 2016-08-07 DIAGNOSIS — H52223 Regular astigmatism, bilateral: Secondary | ICD-10-CM | POA: Diagnosis not present

## 2016-08-07 DIAGNOSIS — E119 Type 2 diabetes mellitus without complications: Secondary | ICD-10-CM | POA: Diagnosis not present

## 2016-08-07 LAB — HM DIABETES EYE EXAM

## 2016-08-08 DIAGNOSIS — M47816 Spondylosis without myelopathy or radiculopathy, lumbar region: Secondary | ICD-10-CM | POA: Diagnosis not present

## 2016-08-08 DIAGNOSIS — M9903 Segmental and somatic dysfunction of lumbar region: Secondary | ICD-10-CM | POA: Diagnosis not present

## 2016-08-08 DIAGNOSIS — M5137 Other intervertebral disc degeneration, lumbosacral region: Secondary | ICD-10-CM | POA: Diagnosis not present

## 2016-08-08 DIAGNOSIS — M4714 Other spondylosis with myelopathy, thoracic region: Secondary | ICD-10-CM | POA: Diagnosis not present

## 2016-08-08 DIAGNOSIS — M9905 Segmental and somatic dysfunction of pelvic region: Secondary | ICD-10-CM | POA: Diagnosis not present

## 2016-08-08 DIAGNOSIS — M9902 Segmental and somatic dysfunction of thoracic region: Secondary | ICD-10-CM | POA: Diagnosis not present

## 2016-08-15 DIAGNOSIS — M4714 Other spondylosis with myelopathy, thoracic region: Secondary | ICD-10-CM | POA: Diagnosis not present

## 2016-08-15 DIAGNOSIS — M9903 Segmental and somatic dysfunction of lumbar region: Secondary | ICD-10-CM | POA: Diagnosis not present

## 2016-08-15 DIAGNOSIS — M5137 Other intervertebral disc degeneration, lumbosacral region: Secondary | ICD-10-CM | POA: Diagnosis not present

## 2016-08-15 DIAGNOSIS — M47816 Spondylosis without myelopathy or radiculopathy, lumbar region: Secondary | ICD-10-CM | POA: Diagnosis not present

## 2016-08-15 DIAGNOSIS — M9905 Segmental and somatic dysfunction of pelvic region: Secondary | ICD-10-CM | POA: Diagnosis not present

## 2016-08-15 DIAGNOSIS — M9902 Segmental and somatic dysfunction of thoracic region: Secondary | ICD-10-CM | POA: Diagnosis not present

## 2016-08-17 ENCOUNTER — Encounter: Payer: Self-pay | Admitting: Family Medicine

## 2016-08-22 DIAGNOSIS — M4714 Other spondylosis with myelopathy, thoracic region: Secondary | ICD-10-CM | POA: Diagnosis not present

## 2016-08-22 DIAGNOSIS — M9905 Segmental and somatic dysfunction of pelvic region: Secondary | ICD-10-CM | POA: Diagnosis not present

## 2016-08-22 DIAGNOSIS — M5137 Other intervertebral disc degeneration, lumbosacral region: Secondary | ICD-10-CM | POA: Diagnosis not present

## 2016-08-22 DIAGNOSIS — M47816 Spondylosis without myelopathy or radiculopathy, lumbar region: Secondary | ICD-10-CM | POA: Diagnosis not present

## 2016-08-22 DIAGNOSIS — M9902 Segmental and somatic dysfunction of thoracic region: Secondary | ICD-10-CM | POA: Diagnosis not present

## 2016-08-22 DIAGNOSIS — M9903 Segmental and somatic dysfunction of lumbar region: Secondary | ICD-10-CM | POA: Diagnosis not present

## 2016-08-31 ENCOUNTER — Ambulatory Visit (INDEPENDENT_AMBULATORY_CARE_PROVIDER_SITE_OTHER): Payer: 59 | Admitting: Adult Health

## 2016-08-31 ENCOUNTER — Ambulatory Visit: Payer: 59 | Attending: Sports Medicine | Admitting: Physical Therapy

## 2016-08-31 ENCOUNTER — Encounter: Payer: Self-pay | Admitting: Physical Therapy

## 2016-08-31 ENCOUNTER — Encounter: Payer: Self-pay | Admitting: Adult Health

## 2016-08-31 VITALS — BP 106/78 | Temp 98.3°F

## 2016-08-31 DIAGNOSIS — M25611 Stiffness of right shoulder, not elsewhere classified: Secondary | ICD-10-CM | POA: Diagnosis not present

## 2016-08-31 DIAGNOSIS — G8929 Other chronic pain: Secondary | ICD-10-CM | POA: Insufficient documentation

## 2016-08-31 DIAGNOSIS — M545 Low back pain: Secondary | ICD-10-CM | POA: Diagnosis not present

## 2016-08-31 DIAGNOSIS — M25511 Pain in right shoulder: Secondary | ICD-10-CM | POA: Diagnosis not present

## 2016-08-31 DIAGNOSIS — J029 Acute pharyngitis, unspecified: Secondary | ICD-10-CM | POA: Diagnosis not present

## 2016-08-31 DIAGNOSIS — J0141 Acute recurrent pansinusitis: Secondary | ICD-10-CM

## 2016-08-31 LAB — POCT RAPID STREP A (OFFICE): RAPID STREP A SCREEN: NEGATIVE

## 2016-08-31 MED ORDER — AMOXICILLIN-POT CLAVULANATE 875-125 MG PO TABS: 1.0000 | ORAL_TABLET | Freq: Two times a day (BID) | ORAL | 0 refills | Status: DC

## 2016-08-31 NOTE — Therapy (Signed)
[place], [place], [address] Phone: [phone]   Fax:  [phone]  Physical Therapy Evaluation  Patient Details  Name: Lauren Espinoza MRN: [id number] Date of Birth: 1970-07-14 Referring Provider: Verner Chol, MD  Encounter Date: 08/31/2016      PT End of Session - 08/31/16 1149    Visit Number 1   Number of Visits 13   Date for PT Re-Evaluation 10/13/16   Authorization Type [facility]   PT Start Time 6962   PT Stop Time 9528   PT Time Calculation (min) 59 min   Activity Tolerance Patient tolerated treatment well   Behavior During Therapy [facility] for tasks assessed/performed      Past Medical History:  Diagnosis Date  . BACK PAIN, UPPER 44/17/2010  . Diabetes mellitus   . DVT, HX OF 04/20/2008  . Edema   . Hyperlipidemia   . HYPERTENSION 04/20/2008  . Left shoulder pain    sees Dr Alphonzo Cruise  . SEIZURE DISORDER 04/20/2008   last seizure  1995    Past Surgical History:  Procedure Laterality Date  . CESAREAN SECTION    . CHOLECYSTECTOMY    . ENDOMETRIAL ABLATION  08-09-11   per Dr. Philis Pique     There were no vitals filed for this visit.       Subjective Assessment - 08/31/16 1150    Subjective shoulder began hurting about 6 mo ago, insidious onset. Has a 44 year old, reaches behind incar. No pain at rest, only pain noted when reaching behind. does not feel that it moves as much as L. LBP about 6 years since daughter was born. Diagnosied with degenerative disk disease. Feels an ache when sitting for long periods. Sits at work and is going to ask for standing desk. Walks 3/week for exercise.    How long can you sit comfortably? limited amount of time   Patient Stated Goals decrease pain, reach behind    Currently in Pain? Yes   Pain Score 1    Pain Location Back   Pain Orientation Lower   Pain Descriptors / Indicators Aching   Aggravating Factors  sitting for long periods, laying supine   Pain  Relieving Factors laying with upper body elevated            [facility] PT Assessment - 08/31/16 0001      Assessment   Medical Diagnosis R adhesive capsulitis, LBP   Referring Provider Verner Chol, MD   Hand Dominance Right   Prior Therapy no     Precautions   Precautions None     Restrictions   Weight Bearing Restrictions No     Balance Screen   Has the patient fallen in the past 6 months No     [place] residence   Living Arrangements Spouse/significant other;Children   Additional Comments stairs at home     Prior Function   Level of Independence Independent     Cognition   Overall Cognitive Status Within Functional Limits for tasks assessed     Observation/Other Assessments   Focus on Therapeutic Outcomes (FOTO)  40% limited (goal 32%)     Sensation   Additional Comments bilateral tingling sensation down legs at night     ROM / Strength   AROM / PROM / Strength PROM;Strength     PROM   PROM Assessment Site Shoulder   Right/Left Shoulder Right   Right Shoulder Flexion 128  Degrees  L 162   Right Shoulder Internal Rotation 40 Degrees  to sacrum, not to midline   Right Shoulder External Rotation 40 Degrees     Strength   Overall Strength Comments hip abductor weakness noted     Palpation   Palpation comment Innom rotation (R post/L ant); painful/tight end feel in GHJ, biceps TTP            Objective measurements completed on examination: See above findings.          [place] Adult PT Treatment/Exercise - 08/31/16 0001      Exercises   Exercises Lumbar;Shoulder     Lumbar Exercises: Supine   AB Set Limitations with pillow squeeze, supine and seated     Manual Therapy   Manual Therapy Muscle Energy Technique;Soft tissue mobilization   Soft tissue mobilization R biceps   Muscle Energy Technique R quads/L HS          Trigger Point Dry Needling - 08/31/16 1323    Consent Given? Yes   Education  Handout Provided --  verbal education   Muscles Treated Upper Body --  biceps              PT Education - 08/31/16 1324    Education provided Yes   Education Details anatomy of condition, POC, HEP, exercise form/rationale   Person(s) Educated Patient   Methods Explanation;Demonstration;Tactile cues;Verbal cues   Comprehension Verbalized understanding;Returned demonstration;Verbal cues required;Tactile cues required;Need further instruction          PT Short Term Goals - 08/31/16 1514      PT SHORT TERM GOAL #1   Title pt will verbalize at least 50% improvement in LBP while seated at work by 7/13   Baseline pain noted throughout her day at work   Time 4   Period Weeks   Status New     PT [place] #2   Title pt will verbalize complaince in home stretching program to improve shoulder flexibility and decrease stiffness   Baseline no stretching program in place at eval   Time 4   Period Weeks   Status New           PT Long Term Goals - 08/31/16 1516      PT LONG TERM GOAL #1   Title FOTO to 32% limitation to indicate significant improvement in functional ability by 7/27   Baseline 40% limitation at eval   Time 6   Period Weeks   Status New     PT LONG TERM GOAL #2   Title Pt will be able to reach behind to hook her bra with minimal discomfort in shoulder   Baseline unable due to pain and limited ROM   Time 6   Period Weeks   Status New     PT LONG TERM GOAL #3   Title Pt will verbalize improved awareness of posture in exercises and functional activities to provide support to spine and shoulder   Baseline began educating at eval   Time 6   Period Weeks   Status New     PT LONG TERM GOAL #4   Title gross MMT strength at hips and shoulder 4+/5 to provide proper stability to joints   Baseline not tested at hip due to notable pelvic rotation at eval, GHJ 3+/5 gross   Time 6   Period Weeks   Status New  Plan - 08/31/16  1325    Clinical Impression Statement Pt presents to PT with complaints of LBP of insidious onset that began about 6 years ago, notable pelvic rotation that was corrected with MET and pt reported comfort in laying supine following. Pt also demo limited IR, ER and flexion ROM in R GHJ with pain in biceps. TPDN performed today and pt was able to reach up to approx L3 with verbalized decrease in pain. Pt will benefit from skilled PT in order to stabilize lumbopelvic region to decrease pain and improve GHJ motion and decrease pain with functional activities.    History and Personal Factors relevant to plan of care: DM, h/o DVT   Clinical Presentation Stable   Clinical Decision Making Low   Rehab Potential Good   PT Frequency 2x / week   PT Duration 6 weeks   PT Treatment/Interventions ADLs/Self Care Home Management;Cryotherapy;Electrical Stimulation;Iontophoresis 51m/ml Dexamethasone;Moist Heat;Traction;Ultrasound;Gait training;Stair training;Functional mobility training;Patient/family education;Neuromuscular re-education;Balance training;Therapeutic exercise;Therapeutic activities;Manual techniques;Passive range of motion;Taping;Dry needling   PT Next Visit Plan DN biceps effectiveness?; lumbopelvic stability-check rotation   PT Home Exercise Plan abdominal engagement in supine & seated, be aware of posture, GHJ IR stretch;    Consulted and Agree with Plan of Care Patient      Patient will benefit from skilled therapeutic intervention in order to improve the following deficits and impairments:  Decreased range of motion, Impaired UE functional use, Increased muscle spasms, Decreased activity tolerance, Pain, Improper body mechanics, Decreased mobility, Decreased strength, Impaired sensation, Postural dysfunction  Visit Diagnosis: Chronic midline low back pain, with sciatica presence unspecified - Plan: PT plan of care cert/re-cert  Chronic right shoulder pain - Plan: PT plan of care  cert/re-cert  Stiffness of right shoulder, not elsewhere classified - Plan: PT plan of care cert/re-cert     Problem List Patient Active Problem List   Diagnosis Date Noted  . Diabetes mellitus without complication ([place] 065/99/3570 . BACK PAIN, UPPER 09/03/2008  . HYPERTENSION 04/20/2008  . Seizures ([place] 04/20/2008  . DVT, HX OF 04/20/2008   Wynonna Fitzhenry C. Junior Kenedy PT, DPT 08/31/16 3:24 PM   [facility] [address]Phone: [phone]  Fax:  [phone] Name: TLorynn MoeserMRN: [id number] of Birth: [birth year]

## 2016-08-31 NOTE — Progress Notes (Signed)
Subjective:    Patient ID: Lauren Espinoza, female    DOB: 01/13/1971, 46 y.o.   MRN: [id number]  Sinusitis  This is a recurrent problem. The current episode started in the past 7 days. The maximum temperature recorded prior to her arrival was 100.4 - 100.9 F. The fever has been present for less than 1 day. Associated symptoms include congestion, ear pain, headaches, sinus pressure and a sore throat. Pertinent negatives include no chills, coughing or shortness of breath. Treatments tried: Sudafed and Mucinex  The treatment provided no relief.   Her daughter was recently diagnosed with strep    Review of Systems  Constitutional: Negative for chills.  HENT: Positive for congestion, ear pain, sinus pain, sinus pressure, sore throat and trouble swallowing. Negative for postnasal drip, rhinorrhea and tinnitus.   Respiratory: Negative for cough and shortness of breath.   Neurological: Positive for headaches.  Hematological: Positive for adenopathy.   Past Medical History:  Diagnosis Date  . BACK PAIN, UPPER 09/03/2008  . Diabetes mellitus   . DVT, HX OF 04/20/2008  . Edema   . Hyperlipidemia   . HYPERTENSION 04/20/2008  . Left shoulder pain    sees Dr Lauren Espinoza  . SEIZURE DISORDER 04/20/2008   last seizure  1995    Social History   Social History  . Marital status: Married    Spouse name: N/A  . Number of children: N/A  . Years of education: N/A   Occupational History  . accounting  supervisor  [employer]   Social History Main Topics  . Smoking status: Never Smoker  . Smokeless tobacco: Never Used  . Alcohol use No  . Drug use: No  . Sexual activity: Not on file   Other Topics Concern  . Not on file   Social History Narrative  . No narrative on file    Past Surgical History:  Procedure Laterality Date  . CESAREAN SECTION    . CHOLECYSTECTOMY    . ENDOMETRIAL ABLATION  08-09-11   per Dr. Henderson Espinoza     Family History  Problem Relation Age of Onset  . Diabetes Father    . Diabetes Maternal Uncle   . Diabetes Paternal Grandmother   . Diabetes Paternal Grandfather     No Known Allergies  Current Outpatient Prescriptions on File Prior to Visit  Medication Sig Dispense Refill  . amoxicillin-clavulanate (AUGMENTIN) 875-125 MG tablet Take 1 tablet by mouth 2 (two) times daily. 20 tablet 0  . aspirin 325 MG tablet Take 325 mg by mouth daily.    Marland Kitchen. atorvastatin (LIPITOR) 40 MG tablet Take 1 tablet (40 mg total) by mouth daily. 90 tablet 3  . diazepam (VALIUM) 5 MG tablet Take 1 tablet (5 mg total) by mouth every 12 (twelve) hours as needed for anxiety. 180 tablet 1  . hydrochlorothiazide (HYDRODIURIL) 25 MG tablet TAKE 1 TABLET BY MOUTH DAILY 90 tablet 2  . lisinopril (PRINIVIL,ZESTRIL) 20 MG tablet TAKE 1 TABLET BY MOUTH  DAILY 90 tablet 3  . metFORMIN (GLUCOPHAGE) 500 MG tablet TAKE 1 TABLET BY MOUTH TWICE DAILY WITH A MEAL 180 tablet 1  . Naproxen-Esomeprazole (VIMOVO) 500-20 MG TBEC Take 500 mg by mouth 2 (two) times daily.    . Omega-3 Fatty Acids (FISH OIL PO) Take by mouth 2 (two) times daily.    Marland Kitchen. PHENobarbital (LUMINAL) 97.2 MG tablet TAKE 1 TABLET BY MOUTH TWICE DAILY 180 tablet 1   No current facility-administered medications on file prior to visit.  BP 106/78 (BP Location: Left Arm, Patient Position: Sitting, Cuff Size: Normal)   Temp 98.3 F (36.8 C) (Oral)       Objective:   Physical Exam  Constitutional: She is oriented to person, place, and time. She appears well-developed and well-nourished. No distress.  HENT:  Right Ear: Hearing, tympanic membrane, external ear and ear canal normal.  Left Ear: Hearing, tympanic membrane, external ear and ear canal normal.  Nose: Mucosal edema present. No rhinorrhea. Right sinus exhibits maxillary sinus tenderness and frontal sinus tenderness. Left sinus exhibits maxillary sinus tenderness and frontal sinus tenderness.  Mouth/Throat: Uvula is midline, oropharynx is clear and moist and mucous  membranes are normal.  Cardiovascular: Normal rate, regular rhythm, normal heart sounds and intact distal pulses.  Exam reveals no gallop and no friction rub.   No murmur heard. Pulmonary/Chest: Effort normal and breath sounds normal. No respiratory distress. She has no wheezes. She has no rales. She exhibits no tenderness.  Neurological: She is alert and oriented to person, place, and time.  Skin: Skin is warm and dry. No rash noted. She is not diaphoretic. No erythema. No pallor.  Psychiatric: She has a normal mood and affect. Her behavior is normal. Judgment and thought content normal.  Nursing note and vitals reviewed.      Assessment & Plan:  1. Acute recurrent pansinusitis - Will treat for sinusitis with Augmentin  - amoxicillin-clavulanate (AUGMENTIN) 875-125 MG tablet; Take 1 tablet by mouth 2 (two) times daily.  Dispense: 20 tablet; Refill: 0 - Stay hydrated and rest - Follow up if no improvement in the next 2-3 days  2. Sore throat - POC Rapid Strep A- negative    Shirline Frees, NP

## 2016-09-04 ENCOUNTER — Encounter: Payer: 59 | Admitting: Physical Therapy

## 2016-09-06 DIAGNOSIS — M7501 Adhesive capsulitis of right shoulder: Secondary | ICD-10-CM | POA: Diagnosis not present

## 2016-09-06 DIAGNOSIS — M47816 Spondylosis without myelopathy or radiculopathy, lumbar region: Secondary | ICD-10-CM | POA: Diagnosis not present

## 2016-09-07 ENCOUNTER — Ambulatory Visit: Payer: 59 | Admitting: Physical Therapy

## 2016-09-11 ENCOUNTER — Encounter: Payer: Self-pay | Admitting: Family Medicine

## 2016-09-11 ENCOUNTER — Ambulatory Visit (INDEPENDENT_AMBULATORY_CARE_PROVIDER_SITE_OTHER): Payer: 59 | Admitting: Family Medicine

## 2016-09-11 VITALS — BP 114/72 | HR 91 | Resp 12 | Ht 70.0 in

## 2016-09-11 DIAGNOSIS — H6123 Impacted cerumen, bilateral: Secondary | ICD-10-CM

## 2016-09-11 DIAGNOSIS — H938X3 Other specified disorders of ear, bilateral: Secondary | ICD-10-CM

## 2016-09-11 DIAGNOSIS — N898 Other specified noninflammatory disorders of vagina: Secondary | ICD-10-CM

## 2016-09-11 DIAGNOSIS — L298 Other pruritus: Secondary | ICD-10-CM | POA: Diagnosis not present

## 2016-09-11 DIAGNOSIS — H9203 Otalgia, bilateral: Secondary | ICD-10-CM

## 2016-09-11 MED ORDER — FLUCONAZOLE 150 MG PO TABS: 150.0000 mg | ORAL_TABLET | ORAL | 0 refills | Status: AC

## 2016-09-11 MED FILL — FLUCONAZOLE 150 MG TABLET: 150 | 2 days supply | Qty: 2 | Fill #0

## 2016-09-11 NOTE — Progress Notes (Signed)
HPI:   ACUTE VISIT:  No chief complaint on file.   [name] is a 46 y.o. female, who is here today complaining of 10-14 days of "clogged up" ears. She has not identified exacerbating or alleviating factors. She has not tried any OTC treatment. She thinks may be related to cerumen, reporting cerumen excess in ear seen during prior visits.   She was recently treated for, acute pansinusitis, she completed Augmentin yesterday.  She started having sore throat, mainly in the morning, and postnasal drainage.  Ear Fullness   There is pain in both ears. The current episode started 1 to 4 weeks ago. The problem occurs constantly. The problem has been unchanged. There has been no fever. The pain is at a severity of 1/10. The pain is mild. Associated symptoms include rhinorrhea and a sore throat. Pertinent negatives include no abdominal pain, coughing, diarrhea, ear discharge, headaches, hearing loss, rash or vomiting. She has tried antibiotics for the symptoms. The treatment provided moderate relief. There is no history of hearing loss.   She is also complaining of vaginal pruritus, attributed to antibiotic treatment. According to patient she frequently gets vaginal yeast, her gynecologist usually recommend Diflucan for 2 weeks. She denies abdominal pain, vaginal discharge, or vaginal bleeding. LMP: S/P endometrial ablation.  Review of Systems  Constitutional: Negative for activity change, appetite change, fatigue and fever.  HENT: Positive for ear pain, postnasal drip, rhinorrhea and sore throat. Negative for ear discharge, facial swelling, hearing loss, mouth sores, tinnitus and voice change.   Respiratory: Negative for cough, chest tightness, shortness of breath and wheezing.   Gastrointestinal: Negative for abdominal pain, diarrhea, nausea and vomiting.  Genitourinary: Negative for dyspareunia, dysuria, genital sores, vaginal bleeding and vaginal discharge.  Skin: Negative for rash.   Allergic/Immunologic: Negative for environmental allergies.  Neurological: Negative for dizziness, syncope, weakness and headaches.  Hematological: Negative for adenopathy. Does not bruise/bleed easily.      Current Outpatient Prescriptions on File Prior to Visit  Medication Sig Dispense Refill  . amoxicillin-clavulanate (AUGMENTIN) 875-125 MG tablet Take 1 tablet by mouth 2 (two) times daily. 20 tablet 0  . aspirin 325 MG tablet Take 325 mg by mouth daily.    Marland Kitchen atorvastatin (LIPITOR) 40 MG tablet Take 1 tablet (40 mg total) by mouth daily. 90 tablet 3  . diazepam (VALIUM) 5 MG tablet Take 1 tablet (5 mg total) by mouth every 12 (twelve) hours as needed for anxiety. 180 tablet 1  . hydrochlorothiazide (HYDRODIURIL) 25 MG tablet TAKE 1 TABLET BY MOUTH DAILY 90 tablet 2  . lisinopril (PRINIVIL,ZESTRIL) 20 MG tablet TAKE 1 TABLET BY MOUTH  DAILY 90 tablet 3  . metFORMIN (GLUCOPHAGE) 500 MG tablet TAKE 1 TABLET BY MOUTH TWICE DAILY WITH A MEAL 180 tablet 1  . Naproxen-Esomeprazole (VIMOVO) 500-20 MG TBEC Take 500 mg by mouth 2 (two) times daily.    . Omega-3 Fatty Acids (FISH OIL PO) Take by mouth 2 (two) times daily.    Marland Kitchen PHENobarbital (LUMINAL) 97.2 MG tablet TAKE 1 TABLET BY MOUTH TWICE DAILY 180 tablet 1   No current facility-administered medications on file prior to visit.      Past Medical History:  Diagnosis Date  . BACK PAIN, UPPER 09/03/2008  . Diabetes mellitus   . DVT, HX OF 04/20/2008  . Edema   . Hyperlipidemia   . HYPERTENSION 04/20/2008  . Left shoulder pain    sees Dr Chaney Malling  . SEIZURE DISORDER 04/20/2008  last seizure  1995   No Known Allergies  Social History   Social History  . Marital status: Married    Spouse name: N/A  . Number of children: N/A  . Years of education: N/A   Occupational History  . accounting  supervisor  [employer]   Social History Main Topics  . Smoking status: Never Smoker  . Smokeless tobacco: Never Used  . Alcohol use No    . Drug use: No  . Sexual activity: Not Asked   Other Topics Concern  . None   Social History Narrative  . None    Vitals:   09/11/16 1201  BP: 114/72  Pulse: 91  Resp: 12   There is no height or weight on file to calculate BMI.   Physical Exam  Nursing note and vitals reviewed. Constitutional: She is oriented to person, place, and time. She appears well-developed. She does not appear ill. No distress.  HENT:  Head: Atraumatic.  Right Ear: External ear normal.  Left Ear: External ear normal.  Mouth/Throat: Oropharynx is clear and moist and mucous membranes are normal.  Cerumen excess bilateral, unable to see TMs. Post nasal drainage.  Eyes: Conjunctivae and EOM are normal.  Cardiovascular: Normal rate and regular rhythm.   No murmur heard. Respiratory: Effort normal and breath sounds normal. No respiratory distress.  Genitourinary:  Genitourinary Comments: Deferred to gyn.  Lymphadenopathy:       Head (right side): No preauricular adenopathy present.       Head (left side): No preauricular adenopathy present.    She has no cervical adenopathy.  Neurological: She is alert and oriented to person, place, and time. She has normal strength. Gait normal.  Skin: Skin is warm. No rash noted. No erythema.  Psychiatric: She has a normal mood and affect. Her speech is normal.  Well groomed, good eye contact.    ASSESSMENT AND PLAN:   Diagnoses and all orders for this visit:  Earache symptoms, bilateral  After ear lavage, I was able to see right TM, no erythema. Left TM partially seen, no erythema. We discussed possible etiologies, recently completed Augmentin treatment. Instructed about warning signs.  Fullness in ear, bilateral  Greatly improved after ear lavage, bilateral. She tolerated procedure well.  Bilateral impacted cerumen  After explaining risks of ear lavage, she had it bilateral.  Right ear lavage successful, left ear canal still mild  amount,partially removed. Recommend OTC docusate sodium, 2-3 drops of capsule content in left ear might help. Avoid Q-tips. Follow-up as needed.  Vaginal pruritus  ? Vaginal candidiasis. Pelvic examination was not performed during this visit. Based on patient history and recent antibiotic treatment, I treated empirically with oral Diflucan, which she has tolerated well in the past. Recommend following with PCP if symptoms persist.  -     fluconazole (DIFLUCAN) 150 MG tablet; Take 1 tablet (150 mg total) by mouth once a week.    -Ms.Berlinda Lastara Tipping advised to seek immediate medical attention is symptoms suddenly get worse or to follow if symptoms persist or new concerns arise.     Juelz Whittenberg G. Swaz[place], MD  [facility]. [place] office.

## 2016-09-11 NOTE — Patient Instructions (Signed)
  Lauren Espinoza I have seen you today for an acute visit.  A few things to remember from today's visit:   Earache symptoms, bilateral  Fullness in ear, bilateral  Vaginal pruritus    Over the counter Docusate sodium, capsule content in left ear may help.   Medications prescribed today are intended for short period of time and will not be refill upon request, a follow up appointment might be necessary to discuss continuation of of treatment if appropriate.     In general please monitor for signs of worsening symptoms and seek immediate medical attention if any concerning.

## 2016-09-12 ENCOUNTER — Encounter: Payer: Self-pay | Admitting: Adult Health

## 2016-09-12 ENCOUNTER — Other Ambulatory Visit: Payer: Self-pay | Admitting: Adult Health

## 2016-09-12 DIAGNOSIS — J0141 Acute recurrent pansinusitis: Secondary | ICD-10-CM

## 2016-09-12 MED ORDER — AMOXICILLIN-POT CLAVULANATE 875-125 MG PO TABS: 1.0000 | ORAL_TABLET | Freq: Two times a day (BID) | ORAL | 0 refills | Status: DC

## 2016-09-13 ENCOUNTER — Ambulatory Visit: Payer: 59 | Admitting: Physical Therapy

## 2016-09-13 ENCOUNTER — Other Ambulatory Visit: Payer: Self-pay

## 2016-09-13 DIAGNOSIS — M25511 Pain in right shoulder: Secondary | ICD-10-CM | POA: Diagnosis not present

## 2016-09-13 DIAGNOSIS — M25611 Stiffness of right shoulder, not elsewhere classified: Secondary | ICD-10-CM

## 2016-09-13 DIAGNOSIS — G8929 Other chronic pain: Secondary | ICD-10-CM

## 2016-09-13 DIAGNOSIS — M545 Low back pain: Principal | ICD-10-CM

## 2016-09-13 MED ORDER — AZITHROMYCIN 250 MG PO TABS: ORAL_TABLET | ORAL | 0 refills | Status: DC

## 2016-09-13 NOTE — Patient Instructions (Signed)
CHEST: Doorway, Bilateral - Standing    Standing in doorway, place hands on wall with elbows bent at shoulder height. Lean forward. Hold _30__ seconds. __3_ reps per set, _2__ sets per day, __7_ days per week  Copyright  V[employer]. All rights reserved.

## 2016-09-13 NOTE — Therapy (Signed)
[facility] [address] [place], [place], [address] Phone: [phone]   Fax:  [phone]  Physical Therapy Treatment  Patient Details  Name: Lauren Espinoza MRN: [id number] Date of Birth: 1970/08/31 Referring Provider: Melina Fiddler, MD  Encounter Date: 09/13/2016      PT End of Session - 09/13/16 1200    Visit Number 2   Number of Visits 13   Date for PT Re-Evaluation 10/13/16   Authorization Type [facility] U[facility]   PT Start Time 1145   PT Stop Time 1230   PT Time Calculation (min) 45 min      Past Medical History:  Diagnosis Date  . BACK PAIN, UPPER 09/03/2008  . Diabetes mellitus   . DVT, HX OF 04/20/2008  . Edema   . Hyperlipidemia   . HYPERTENSION 04/20/2008  . Left shoulder pain    sees Dr Chaney Malling  . SEIZURE DISORDER 04/20/2008   last seizure  1995    Past Surgical History:  Procedure Laterality Date  . CESAREAN SECTION    . CHOLECYSTECTOMY    . ENDOMETRIAL ABLATION  08-09-11   per Dr. Henderson Cloud     There were no vitals filed for this visit.      Subjective Assessment - 09/13/16 1148    Subjective Still limted with reach behind back    Currently in Pain? No/denies            [facility] PT Assessment - 09/13/16 0001      PROM   PROM Assessment Site Shoulder   Right/Left Shoulder Right   Right Shoulder Flexion 120 Degrees   Right Shoulder ABduction 102 Degrees   Right Shoulder Internal Rotation --  to Lumbar, not midline                      [facility] Adult PT Treatment/Exercise - 09/13/16 0001      Lumbar Exercises: Supine   Ab Set 5 reps   Clam Limitations green band clam    Bridge 10 reps   Other Supine Lumbar Exercises ball squeeze x 10 with abdominal draw in      Shoulder Exercises: Pulleys   Flexion 2 minutes   ABduction Limitations scaption x 2 minutes      Shoulder Exercises: Stretch   Corner Stretch 3 reps;20 seconds   Internal Rotation Stretch 3 reps  30 sec    Wall Stretch -  Flexion 3 reps;20 seconds   Wall Stretch - Flexion Limitations and abduction x 3      Manual Therapy   Soft tissue mobilization IASTYM R biceps   Muscle Energy Technique R quads/L HS          Trigger Point Dry Needling - 09/13/16 1255    Consent Given? Yes   Education Handout Provided --  verbal education   Muscles Treated Upper Body --  bicep   Bicep Response Twitch response elicited;Palpable increased muscle length  Bicep              PT Education - 09/13/16 1215    Education provided Yes   Education Details Hep   Person(s) Educated Patient   Methods Explanation;Handout   Comprehension Verbalized understanding          PT Short Term Goals - 08/31/16 1514      PT SHORT TERM GOAL #1   Title pt will verbalize at least 50% improvement in LBP while seated at work by 7/13   Baseline pain  noted throughout her day at work   Time 4   Period Weeks   Status New     PT SHORT TERM GOAL #2   Title pt will verbalize complaince in home stretching program to improve shoulder flexibility and decrease stiffness   Baseline no stretching program in place at eval   Time 4   Period Weeks   Status New           PT Long Term Goals - 08/31/16 1516      PT LONG TERM GOAL #1   Title FOTO to 32% limitation to indicate significant improvement in functional ability by 7/27   Baseline 40% limitation at eval   Time 6   Period Weeks   Status New     PT LONG TERM GOAL #2   Title Pt will be able to reach behind to hook her bra with minimal discomfort in shoulder   Baseline unable due to pain and limited ROM   Time 6   Period Weeks   Status New     PT LONG TERM GOAL #3   Title Pt will verbalize improved awareness of posture in exercises and functional activities to provide support to spine and shoulder   Baseline began educating at eval   Time 6   Period Weeks   Status New     PT LONG TERM GOAL #4   Title gross MMT strength at hips and shoulder 4+/5 to provide proper  stability to joints   Baseline not tested at hip due to notable pelvic rotation at eval, GHJ 3+/5 gross   Time 6   Period Weeks   Status New               Plan - 09/13/16 1200    Clinical Impression Statement Pt reports bicep TPDN helpful. She feels reaching is easier. She canceled 2 appts due  to sickness. She feels shoulder is a little better and back remains the same. She has a little more active IR. Instructed pt in shoulder/RTC stretches with some increased pain. Began pelvic stability after correcting Rt rotated inominate. [employer] FossaJessica Hightower, DPT performed TPDN to Rt bicep. Pt is on vacation until next week. Added shoulder stretch to HEP.    PT Next Visit Plan DN biceps effectiveness?; lumbopelvic stability-check rotation, review HEP, add to lumbar HEP   PT Home Exercise Plan abdominal engagement in supine & seated, be aware of posture, GHJ IR stretch; doorway stretch   Consulted and Agree with Plan of Care Patient      Patient will benefit from skilled therapeutic intervention in order to improve the following deficits and impairments:  Decreased range of motion, Impaired UE functional use, Increased muscle spasms, Decreased activity tolerance, Pain, Improper body mechanics, Decreased mobility, Decreased strength, Impaired sensation, Postural dysfunction  Visit Diagnosis: Chronic midline low back pain, with sciatica presence unspecified  Chronic right shoulder pain  Stiffness of right shoulder, not elsewhere classified     Problem List Patient Active Problem List   Diagnosis Date Noted  . Diabetes mellitus without complication (HCC) 08/07/2012  . BACK PAIN, UPPER 09/03/2008  . HYPERTENSION 04/20/2008  . Seizures (HCC) 04/20/2008  . DVT, HX OF 04/20/2008    Sherrie Mustacheonoho, Kamare Caspers McGee, PTA 09/13/2016, 1:00 PM  [facility] [address] [place], [place], [address] Phone: [phone]   Fax:  [phone]  Name: Lauren Lastara  Espinoza MRN: [id number] Date of Birth: September 14, 1970

## 2016-09-14 ENCOUNTER — Ambulatory Visit: Payer: 59 | Admitting: Physical Therapy

## 2016-09-25 ENCOUNTER — Ambulatory Visit: Payer: 59 | Attending: Sports Medicine | Admitting: Physical Therapy

## 2016-09-25 ENCOUNTER — Encounter: Payer: Self-pay | Admitting: Physical Therapy

## 2016-09-25 DIAGNOSIS — M25611 Stiffness of right shoulder, not elsewhere classified: Secondary | ICD-10-CM | POA: Diagnosis not present

## 2016-09-25 DIAGNOSIS — G8929 Other chronic pain: Secondary | ICD-10-CM | POA: Diagnosis not present

## 2016-09-25 DIAGNOSIS — M545 Low back pain: Secondary | ICD-10-CM | POA: Diagnosis not present

## 2016-09-25 DIAGNOSIS — M25511 Pain in right shoulder: Secondary | ICD-10-CM | POA: Insufficient documentation

## 2016-09-25 NOTE — Therapy (Signed)
[facility] [address] [place], [place], [address] Phone: [phone]   Fax:  [phone]  Physical Therapy Treatment  Patient Details  Name: Lauren Espinoza MRN: [id number] Date of Birth: December 10, 1970 Referring Provider: Melina FiddlerBassett, Rebecca S, MD  Encounter Date: 09/25/2016      PT End of Session - 09/25/16 1329    Visit Number 3   Number of Visits 13   Date for PT Re-Evaluation 10/13/16   Authorization Type [facility] UMR   PT Start Time 1330   PT Stop Time 1409   PT Time Calculation (min) 39 min   Activity Tolerance Patient limited by pain   Behavior During Therapy [facility] for tasks assessed/performed      Past Medical History:  Diagnosis Date  . BACK PAIN, UPPER 09/03/2008  . Diabetes mellitus   . DVT, HX OF 04/20/2008  . Edema   . Hyperlipidemia   . HYPERTENSION 04/20/2008  . Left shoulder pain    sees Dr Chaney MallingMortenson  . SEIZURE DISORDER 04/20/2008   last seizure  1995    Past Surgical History:  Procedure Laterality Date  . CESAREAN SECTION    . CHOLECYSTECTOMY    . ENDOMETRIAL ABLATION  08-09-11   per Dr. Henderson CloudHorvath     There were no vitals filed for this visit.      Subjective Assessment - 09/25/16 1329    Subjective Pt reports back pain is terrible after sleeping on a different bed while on vacation.                          [facility] Adult PT Treatment/Exercise - 09/25/16 0001      Lumbar Exercises: Aerobic   Stationary Bike nu step 5 min L5     Lumbar Exercises: Supine   Clam Limitations red tband alt LE   Bridge Limitations with ball squeeze   Large Ball Abdominal Isometric 10 reps;5 seconds   Large Ball Oblique Isometric 10 reps;5 seconds   Other Supine Lumbar Exercises dead bug ext + red tband horiz abd   Other Supine Lumbar Exercises iso hamstring curl green physioball +curl     Manual Therapy   Soft tissue mobilization roller & soft tissue to paraspinals, bilat QL                  PT  Short Term Goals - 08/31/16 1514      PT SHORT TERM GOAL #1   Title pt will verbalize at least 50% improvement in LBP while seated at work by 7/13   Baseline pain noted throughout her day at work   Time 4   Period Weeks   Status New     PT SHORT TERM GOAL #2   Title pt will verbalize complaince in home stretching program to improve shoulder flexibility and decrease stiffness   Baseline no stretching program in place at eval   Time 4   Period Weeks   Status New           PT Long Term Goals - 08/31/16 1516      PT LONG TERM GOAL #1   Title FOTO to 32% limitation to indicate significant improvement in functional ability by 7/27   Baseline 40% limitation at eval   Time 6   Period Weeks   Status New     PT LONG TERM GOAL #2   Title Pt will be able to reach behind to hook her bra with  minimal discomfort in shoulder   Baseline unable due to pain and limited ROM   Time 6   Period Weeks   Status New     PT LONG TERM GOAL #3   Title Pt will verbalize improved awareness of posture in exercises and functional activities to provide support to spine and shoulder   Baseline began educating at eval   Time 6   Period Weeks   Status New     PT LONG TERM GOAL #4   Title gross MMT strength at hips and shoulder 4+/5 to provide proper stability to joints   Baseline not tested at hip due to notable pelvic rotation at eval, GHJ 3+/5 gross   Time 6   Period Weeks   Status New               Plan - 09/25/16 1341    Clinical Impression Statement Increased pain in supine, decreased with legs elevated, no increase in pain in standing lumbar movements. Occasional numbness in legs in supine, can be more to one side when she lays one way or another. Significant TTP in lumbar musculature decreased with soft tissue techniques which was decreased when laying in hooklying, cont high levels of pain in supine with legs ext.    PT Treatment/Interventions ADLs/Self Care Home  Management;Cryotherapy;Electrical Stimulation;Iontophoresis 4mg /ml Dexamethasone;Moist Heat;Traction;Ultrasound;Gait training;Stair training;Functional mobility training;Patient/family education;Neuromuscular re-education;Balance training;Therapeutic exercise;Therapeutic activities;Manual techniques;Passive range of motion;Taping;Dry needling   PT Next Visit Plan deep tissue techniques to bilat QL and paraspinals   PT Home Exercise Plan abdominal engagement in supine & seated, be aware of posture, GHJ IR stretch; doorway stretch   Consulted and Agree with Plan of Care Patient      Patient will benefit from skilled therapeutic intervention in order to improve the following deficits and impairments:  Decreased range of motion, Impaired UE functional use, Increased muscle spasms, Decreased activity tolerance, Pain, Improper body mechanics, Decreased mobility, Decreased strength, Impaired sensation, Postural dysfunction  Visit Diagnosis: Chronic midline low back pain, with sciatica presence unspecified  Chronic right shoulder pain  Stiffness of right shoulder, not elsewhere classified     Problem List Patient Active Problem List   Diagnosis Date Noted  . Diabetes mellitus without complication (HCC) 08/07/2012  . BACK PAIN, UPPER 09/03/2008  . HYPERTENSION 04/20/2008  . Seizures (HCC) 04/20/2008  . DVT, HX OF 04/20/2008    Airlie Blumenberg C. Lenny Bouchillon PT, DPT 09/25/16 2:16 PM   [facility] [address] [place], [place], [address] Phone: [phone]   Fax:  [phone]  Name: Lauren Espinoza MRN: [id number] Date of Birth: Apr 23, 1970

## 2016-09-26 DIAGNOSIS — M9905 Segmental and somatic dysfunction of pelvic region: Secondary | ICD-10-CM | POA: Diagnosis not present

## 2016-09-26 DIAGNOSIS — M9903 Segmental and somatic dysfunction of lumbar region: Secondary | ICD-10-CM | POA: Diagnosis not present

## 2016-09-26 DIAGNOSIS — M9902 Segmental and somatic dysfunction of thoracic region: Secondary | ICD-10-CM | POA: Diagnosis not present

## 2016-09-26 DIAGNOSIS — M4714 Other spondylosis with myelopathy, thoracic region: Secondary | ICD-10-CM | POA: Diagnosis not present

## 2016-09-26 DIAGNOSIS — M47816 Spondylosis without myelopathy or radiculopathy, lumbar region: Secondary | ICD-10-CM | POA: Diagnosis not present

## 2016-09-26 DIAGNOSIS — M5137 Other intervertebral disc degeneration, lumbosacral region: Secondary | ICD-10-CM | POA: Diagnosis not present

## 2016-09-27 ENCOUNTER — Ambulatory Visit: Payer: 59 | Admitting: Physical Therapy

## 2016-09-27 DIAGNOSIS — M545 Low back pain: Secondary | ICD-10-CM | POA: Diagnosis not present

## 2016-09-27 DIAGNOSIS — M25611 Stiffness of right shoulder, not elsewhere classified: Secondary | ICD-10-CM

## 2016-09-27 DIAGNOSIS — M25511 Pain in right shoulder: Secondary | ICD-10-CM | POA: Diagnosis not present

## 2016-09-27 DIAGNOSIS — G8929 Other chronic pain: Secondary | ICD-10-CM

## 2016-09-27 NOTE — Therapy (Signed)
[place], [place], [address] Phone: [phone]   Fax:  [phone]  Physical Therapy Treatment  Patient Details  Name: Lauren Espinoza MRN: [id number] Date of Birth: 03-16-1971 Referring Provider: Verner Chol, MD  Encounter Date: 09/27/2016      PT End of Session - 09/27/16 1302    Visit Number 4   Number of Visits 13   Date for PT Re-Evaluation 10/13/16   Authorization Type [facility] UMR   PT Start Time 1300   PT Stop Time 1345   PT Time Calculation (min) 45 min      Past Medical History:  Diagnosis Date  . BACK PAIN, UPPER 09/03/2008  . Diabetes mellitus   . DVT, HX OF 04/20/2008  . Edema   . Hyperlipidemia   . HYPERTENSION 04/20/2008  . Left shoulder pain    sees Dr Alphonzo Cruise  . SEIZURE DISORDER 04/20/2008   last seizure  1995    Past Surgical History:  Procedure Laterality Date  . CESAREAN SECTION    . CHOLECYSTECTOMY    . ENDOMETRIAL ABLATION  08-09-11   per Dr. Philis Pique     There were no vitals filed for this visit.      Subjective Assessment - 09/27/16 1302    Currently in Pain? No/denies                         [facility] Adult PT Treatment/Exercise - 09/27/16 0001      Lumbar Exercises: Stretches   Single Knee to Chest Stretch 2 reps;30 seconds   Lower Trunk Rotation 2 reps;30 seconds     Lumbar Exercises: Supine   Clam Limitations green tband alt LE   Heel Slides 10 reps   Heel Slides Limitations with abdominal draw in   Bridge Limitations with ball squeeze   Large Ball Abdominal Isometric 10 reps;5 seconds   Large Ball Oblique Isometric 10 reps;5 seconds   Other Supine Lumbar Exercises iso hamstring curl green physioball +curl     Manual Therapy   Soft tissue mobilization Soft tissue work to bilateral lumbar paraspinals with Trigger point release x 5 on each side                   PT Short Term Goals - 09/27/16 1339      PT SHORT TERM GOAL #1   Title pt will verbalize at least 50% improvement in LBP while seated at work by 7/13   Baseline 25%   Time 4   Period Weeks   Status On-going     PT SHORT TERM GOAL #2   Title pt will verbalize complaince in home stretching program to improve shoulder flexibility and decrease stiffness   Time 4   Period Weeks   Status Achieved           PT Long Term Goals - 08/31/16 1516      PT LONG TERM GOAL #1   Title FOTO to 32% limitation to indicate significant improvement in functional ability by 7/27   Baseline 40% limitation at eval   Time 6   Period Weeks   Status New     PT LONG TERM GOAL #2   Title Pt will be able to reach behind to hook her bra with minimal discomfort in shoulder   Baseline unable due to pain and limited ROM   Time 6   Period Weeks   Status New  PT LONG TERM GOAL #3   Title Pt will verbalize improved awareness of posture in exercises and functional activities to provide support to spine and shoulder   Baseline began educating at eval   Time 6   Period Weeks   Status New     PT LONG TERM GOAL #4   Title gross MMT strength at hips and shoulder 4+/5 to provide proper stability to joints   Baseline not tested at hip due to notable pelvic rotation at eval, GHJ 3+/5 gross   Time 6   Period Weeks   Status New               Plan - 09/27/16 1341    Clinical Impression Statement Multiple trigger points realeased in lumbar paraspinals. Tolerated well in massage chair. Continued core strengthening without increasesed pain. Pt reports 25 % improvement on pain with sitting. No change in pain qwith lying flat, still 10/10. She os doing HEP. STG#2 met,    PT Next Visit Plan repeat deep tissue techniques to bilat QL and paraspinals if helpful; core HEP    PT Home Exercise Plan abdominal engagement in supine & seated, be aware of posture, GHJ IR stretch; doorway stretch   Consulted and Agree with Plan of Care Patient      Patient will benefit from skilled  therapeutic intervention in order to improve the following deficits and impairments:     Visit Diagnosis: Chronic midline low back pain, with sciatica presence unspecified  Chronic right shoulder pain  Stiffness of right shoulder, not elsewhere classified     Problem List Patient Active Problem List   Diagnosis Date Noted  . Diabetes mellitus without complication ([place]) 16/60/6301  . BACK PAIN, UPPER 09/03/2008  . HYPERTENSION 04/20/2008  . Seizures ([place]) 04/20/2008  . DVT, HX OF 04/20/2008    Dorene Ar, PTA 09/27/2016, 1:45 PM  [place], [place], [address] Phone: [phone]   Fax:  [phone]  Name: Seher Schlagel MRN: [id number] Date of Birth: 06-May-1970

## 2016-10-02 ENCOUNTER — Ambulatory Visit: Payer: 59 | Admitting: Physical Therapy

## 2016-10-02 ENCOUNTER — Encounter: Payer: Self-pay | Admitting: Physical Therapy

## 2016-10-02 DIAGNOSIS — M25511 Pain in right shoulder: Secondary | ICD-10-CM

## 2016-10-02 DIAGNOSIS — G8929 Other chronic pain: Secondary | ICD-10-CM

## 2016-10-02 DIAGNOSIS — M25611 Stiffness of right shoulder, not elsewhere classified: Secondary | ICD-10-CM | POA: Diagnosis not present

## 2016-10-02 DIAGNOSIS — M545 Low back pain: Secondary | ICD-10-CM | POA: Diagnosis not present

## 2016-10-02 NOTE — Therapy (Signed)
[facility] [address] [place], [place], [address] Phone: [phone]   Fax:  [phone]  Physical Therapy Treatment  Patient Details  Name: Lauren Espinoza MRN: [id number] Date of Birth: 12-24-1970 Referring Provider: Melina FiddlerBassett, Rebecca S, MD  Encounter Date: 10/02/2016      PT End of Session - 10/02/16 1329    Visit Number 5   Number of Visits 13   Date for PT Re-Evaluation 10/13/16   Authorization Type [facility] UMR   PT Start Time 1330   PT Stop Time 1414   PT Time Calculation (min) 44 min   Activity Tolerance Patient tolerated treatment well   Behavior During Therapy [facility] for tasks assessed/performed      Past Medical History:  Diagnosis Date  . BACK PAIN, UPPER 09/03/2008  . Diabetes mellitus   . DVT, HX OF 04/20/2008  . Edema   . Hyperlipidemia   . HYPERTENSION 04/20/2008  . Left shoulder pain    sees Dr Chaney MallingMortenson  . SEIZURE DISORDER 04/20/2008   last seizure  1995    Past Surgical History:  Procedure Laterality Date  . CESAREAN SECTION    . CHOLECYSTECTOMY    . ENDOMETRIAL ABLATION  08-09-11   per Dr. Henderson CloudHorvath     There were no vitals filed for this visit.      Subjective Assessment - 10/02/16 1330    Subjective played pool volleyball yesterday so shoulder is sore, quick overhead motions were painful. . cont to have pain down both legs when laying in a certain way. pain in shoulder cont when reaching to hook bra.   Currently in Pain? No/denies                         [facility] Adult PT Treatment/Exercise - 10/02/16 0001      Lumbar Exercises: Aerobic   Stationary Bike nu step 5 min L5  UE & LE     Shoulder Exercises: Supine   External Rotation 20 reps   Theraband Level (Shoulder External Rotation) Level 2 (Red)   Other Supine Exercises diagonals red tband     Shoulder Exercises: Standing   External Rotation 20 reps   Theraband Level (Shoulder External Rotation) Level 3 (Green)   Internal  Rotation 20 reps   Theraband Level (Shoulder Internal Rotation) Level 3 (Green)     Shoulder Exercises: Stretch   Table Stretch - ABduction Limitations + external rotation   Other Shoulder Stretches open book x3 each     Manual Therapy   Manual Therapy Taping   Manual therapy comments scapular mobilizations; contract/relax stretches   Soft tissue mobilization subscap, periscapular region   Kinesiotex Inhibit Muscle     Kinesiotix   Inhibit Muscle  R upper trap                PT Education - 10/02/16 1339    Education provided Yes   Education Details anatomy of condition-results of xrays, exercise form/rationale.    Person(s) Educated Patient   Methods Explanation;Demonstration;Tactile cues;Verbal cues;Handout   Comprehension Verbalized understanding;Returned demonstration;Verbal cues required;Tactile cues required;Need further instruction          PT Short Term Goals - 09/27/16 1339      PT SHORT TERM GOAL #1   Title pt will verbalize at least 50% improvement in LBP while seated at work by 7/13   Baseline 25%   Time 4   Period Weeks   Status On-going  PT SHORT TERM GOAL #2   Title pt will verbalize complaince in home stretching program to improve shoulder flexibility and decrease stiffness   Time 4   Period Weeks   Status Achieved           PT Long Term Goals - 08/31/16 1516      PT LONG TERM GOAL #1   Title FOTO to 32% limitation to indicate significant improvement in functional ability by 7/27   Baseline 40% limitation at eval   Time 6   Period Weeks   Status New     PT LONG TERM GOAL #2   Title Pt will be able to reach behind to hook her bra with minimal discomfort in shoulder   Baseline unable due to pain and limited ROM   Time 6   Period Weeks   Status New     PT LONG TERM GOAL #3   Title Pt will verbalize improved awareness of posture in exercises and functional activities to provide support to spine and shoulder   Baseline began  educating at eval   Time 6   Period Weeks   Status New     PT LONG TERM GOAL #4   Title gross MMT strength at hips and shoulder 4+/5 to provide proper stability to joints   Baseline not tested at hip due to notable pelvic rotation at eval, GHJ 3+/5 gross   Time 6   Period Weeks   Status New               Plan - 10/02/16 1615    Clinical Impression Statement Concordant shoulder pain found in trigger point [place] R subscapularis. Improved scapular mobility with scapula cont to sit flat against ribcage, bound down, unable to distract scapula. Exercises to focus on scapular stability and increase throacic curvature for more normalized GHJ/scapular mobility.    PT Treatment/Interventions ADLs/Self Care Home Management;Cryotherapy;Electrical Stimulation;Iontophoresis 4mg /ml Dexamethasone;Moist Heat;Traction;Ultrasound;Gait training;Stair training;Functional mobility training;Patient/family education;Neuromuscular re-education;Balance training;Therapeutic exercise;Therapeutic activities;Manual techniques;Passive range of motion;Taping;Dry needling   PT Next Visit Plan deep tissue techniques to bilat QL and Lumbar paraspinals, transverse abdominis activation/strengthening   PT Home Exercise Plan abdominal engagement in supine & seated, be aware of posture, GHJ IR stretch; doorway stretch   Consulted and Agree with Plan of Care Patient      Patient will benefit from skilled therapeutic intervention in order to improve the following deficits and impairments:  Decreased range of motion, Impaired UE functional use, Increased muscle spasms, Decreased activity tolerance, Pain, Improper body mechanics, Decreased mobility, Decreased strength, Impaired sensation, Postural dysfunction  Visit Diagnosis: Chronic midline low back pain, with sciatica presence unspecified  Chronic right shoulder pain  Stiffness of right shoulder, not elsewhere classified     Problem List Patient Active Problem List    Diagnosis Date Noted  . Diabetes mellitus without complication (HCC) 08/07/2012  . BACK PAIN, UPPER 09/03/2008  . HYPERTENSION 04/20/2008  . Seizures (HCC) 04/20/2008  . DVT, HX OF 04/20/2008    Nicolai Labonte C. Cereniti Curb PT, DPT 10/02/16 4:21 PM   [facility] [address] [place], [place], [address] Phone: [phone]   Fax:  [phone]  Name: Lauren Espinoza MRN: [id number] Date of Birth: Feb 11, 1971

## 2016-10-04 ENCOUNTER — Ambulatory Visit (INDEPENDENT_AMBULATORY_CARE_PROVIDER_SITE_OTHER): Payer: 59 | Admitting: Family Medicine

## 2016-10-04 ENCOUNTER — Ambulatory Visit: Payer: 59 | Admitting: Physical Therapy

## 2016-10-04 ENCOUNTER — Encounter: Payer: Self-pay | Admitting: Family Medicine

## 2016-10-04 VITALS — BP 100/77 | HR 82 | Temp 98.3°F

## 2016-10-04 DIAGNOSIS — E119 Type 2 diabetes mellitus without complications: Secondary | ICD-10-CM | POA: Diagnosis not present

## 2016-10-04 DIAGNOSIS — M25511 Pain in right shoulder: Secondary | ICD-10-CM | POA: Diagnosis not present

## 2016-10-04 DIAGNOSIS — M545 Low back pain: Principal | ICD-10-CM

## 2016-10-04 DIAGNOSIS — G8929 Other chronic pain: Secondary | ICD-10-CM

## 2016-10-04 DIAGNOSIS — Z0000 Encounter for general adult medical examination without abnormal findings: Secondary | ICD-10-CM

## 2016-10-04 DIAGNOSIS — M25611 Stiffness of right shoulder, not elsewhere classified: Secondary | ICD-10-CM | POA: Diagnosis not present

## 2016-10-04 DIAGNOSIS — R569 Unspecified convulsions: Secondary | ICD-10-CM

## 2016-10-04 LAB — LIPID PANEL
CHOL/HDL RATIO: 4
Cholesterol: 167 mg/dL (ref 0–200)
HDL: 43.7 mg/dL (ref >39.00)
LDL Cholesterol: 93 mg/dL (ref 0–99)
NonHDL: 123
TRIGLYCERIDES: 148 mg/dL (ref 0.0–149.0)
VLDL: 29.6 mg/dL (ref 0.0–40.0)

## 2016-10-04 LAB — HEPATIC FUNCTION PANEL
ALBUMIN: 4.2 g/dL (ref 3.5–5.2)
ALT: 16 U/L (ref 0–35)
AST: 14 U/L (ref 0–37)
Alkaline Phosphatase: 37 U/L — ABNORMAL LOW (ref 39–117)
BILIRUBIN TOTAL: 0.4 mg/dL (ref 0.2–1.2)
Bilirubin, Direct: 0.1 mg/dL (ref 0.0–0.3)
TOTAL PROTEIN: 6.6 g/dL (ref 6.0–8.3)

## 2016-10-04 LAB — CBC WITH DIFFERENTIAL/PLATELET
Basophils Absolute: 0.1 10*3/uL (ref 0.0–0.1)
Basophils Relative: 1.2 % (ref 0.0–3.0)
EOS ABS: 0.2 10*3/uL (ref 0.0–0.7)
Eosinophils Relative: 3.1 % (ref 0.0–5.0)
HCT: 40.6 % (ref 36.0–46.0)
Hemoglobin: 13.7 g/dL (ref 12.0–15.0)
LYMPHS ABS: 1.5 10*3/uL (ref 0.7–4.0)
Lymphocytes Relative: 25.7 % (ref 12.0–46.0)
MCHC: 33.7 g/dL (ref 30.0–36.0)
MCV: 92 fl (ref 78.0–100.0)
MONO ABS: 0.4 10*3/uL (ref 0.1–1.0)
Monocytes Relative: 6.8 % (ref 3.0–12.0)
NEUTROS ABS: 3.8 10*3/uL (ref 1.4–7.7)
NEUTROS PCT: 63.2 % (ref 43.0–77.0)
PLATELETS: 331 10*3/uL (ref 150.0–400.0)
RBC: 4.42 Mil/uL (ref 3.87–5.11)
RDW: 12.7 % (ref 11.5–15.5)
WBC: 6 10*3/uL (ref 4.0–10.5)

## 2016-10-04 LAB — BASIC METABOLIC PANEL
BUN: 14 mg/dL (ref 6–23)
CHLORIDE: 100 meq/L (ref 96–112)
CO2: 25 mEq/L (ref 19–32)
CREATININE: 0.64 mg/dL (ref 0.40–1.20)
Calcium: 9.6 mg/dL (ref 8.4–10.5)
GFR: 107.36 mL/min (ref >60.00)
GLUCOSE: 113 mg/dL — AB (ref 70–99)
Potassium: 4.4 mEq/L (ref 3.5–5.1)
Sodium: 137 mEq/L (ref 135–145)

## 2016-10-04 LAB — POC URINALSYSI DIPSTICK (AUTOMATED)
Bilirubin, UA: NEGATIVE
Glucose, UA: NEGATIVE
KETONES UA: NEGATIVE
LEUKOCYTES UA: NEGATIVE
Nitrite, UA: NEGATIVE
PH UA: 6 (ref 5.0–8.0)
PROTEIN UA: NEGATIVE
RBC UA: NEGATIVE
SPEC GRAV UA: 1.025 (ref 1.010–1.025)
Urobilinogen, UA: 0.2 E.U./dL

## 2016-10-04 LAB — TSH: TSH: 3.67 u[IU]/mL (ref 0.35–4.50)

## 2016-10-04 LAB — HEMOGLOBIN A1C: HEMOGLOBIN A1C: 5.8 % (ref 4.6–6.5)

## 2016-10-04 MED ORDER — LISINOPRIL 10 MG PO TABS: 10.0000 mg | ORAL_TABLET | Freq: Every day | ORAL | 3 refills | Status: DC

## 2016-10-04 MED ORDER — HYDROCHLOROTHIAZIDE 25 MG PO TABS: 25.0000 mg | ORAL_TABLET | Freq: Every day | ORAL | 3 refills | Status: DC

## 2016-10-04 MED ORDER — METFORMIN HCL 500 MG PO TABS: ORAL_TABLET | ORAL | 3 refills | Status: DC

## 2016-10-04 MED ORDER — ATORVASTATIN CALCIUM 40 MG PO TABS: 40.0000 mg | ORAL_TABLET | Freq: Every day | ORAL | 3 refills | Status: DC

## 2016-10-04 MED FILL — LISINOPRIL 10 MG TABLET: 10 | 90 days supply | Qty: 90 | Fill #0

## 2016-10-04 MED FILL — metFORMIN HCL 500 MG TABS: 500 | 90 days supply | Qty: 180 | Fill #0

## 2016-10-04 NOTE — Therapy (Signed)
[facility] [address] [place], [place], [address] Phone: [phone]   Fax:  [phone]  Physical Therapy Treatment  Patient Details  Name: Lauren Espinoza MRN: [id number] Date of Birth: [birth year] Referring Provider: Melina FiddlerBassett, Rebecca S, MD  Encounter Date: 10/04/2016      PT End of Session - 10/04/16 1301    Visit Number 6   Number of Visits 13   Date for PT Re-Evaluation 10/13/16   Authorization Type [facility]   PT Start Time 0100   PT Stop Time 0146   PT Time Calculation (min) 46 min      Past Medical History:  Diagnosis Date  . BACK PAIN, UPPER 09/03/2008  . Diabetes mellitus   . DVT, HX OF 04/20/2008  . Edema   . Hyperlipidemia   . HYPERTENSION 04/20/2008  . Left shoulder pain    sees Dr Chaney MallingMortenson  . SEIZURE DISORDER 04/20/2008   last seizure  1995    Past Surgical History:  Procedure Laterality Date  . CESAREAN SECTION    . CHOLECYSTECTOMY    . ENDOMETRIAL ABLATION  08-09-11   per Dr. Henderson CloudHorvath     There were no vitals filed for this visit.      Subjective Assessment - 10/04/16 1305    Subjective Tape helped remind me to relax my shoulder.                         [facility] Adult PT Treatment/Exercise - 10/04/16 0001      Lumbar Exercises: Supine   Straight Leg Raise 10 reps   Large Ball Abdominal Isometric 10 reps;5 seconds   Large Ball Oblique Isometric 10 reps;5 seconds     Shoulder Exercises: Supine   Horizontal ABduction 20 reps   Theraband Level (Shoulder Horizontal ABduction) Level 2 (Red)   External Rotation 20 reps   Theraband Level (Shoulder External Rotation) Level 2 (Red)   Other Supine Exercises diagonals red tband     Shoulder Exercises: Stretch   Other Shoulder Stretches open book x3 each     Manual Therapy   Soft tissue mobilization Soft tissue work to bilateral lumbar paraspinals with Trigger point release x 5 on each side                   PT Short Term  Goals - 09/27/16 1339      PT SHORT TERM GOAL #1   Title pt will verbalize at least 50% improvement in LBP while seated at work by 7/13   Baseline 25%   Time 4   Period Weeks   Status On-going     PT SHORT TERM GOAL #2   Title pt will verbalize complaince in home stretching program to improve shoulder flexibility and decrease stiffness   Time 4   Period Weeks   Status Achieved           PT Long Term Goals - 08/31/16 1516      PT LONG TERM GOAL #1   Title FOTO to 32% limitation to indicate significant improvement in functional ability by 7/27   Baseline 40% limitation at eval   Time 6   Period Weeks   Status New     PT LONG TERM GOAL #2   Title Pt will be able to reach behind to hook her bra with minimal discomfort in shoulder   Baseline unable due to pain and limited ROM   Time 6  Period Weeks   Status New     PT LONG TERM GOAL #3   Title Pt will verbalize improved awareness of posture in exercises and functional activities to provide support to spine and shoulder   Baseline began educating at eval   Time 6   Period Weeks   Status New     PT LONG TERM GOAL #4   Title gross MMT strength at hips and shoulder 4+/5 to provide proper stability to joints   Baseline not tested at hip due to notable pelvic rotation at eval, GHJ 3+/5 gross   Time 6   Period Weeks   Status New               Plan - 10/04/16 1308    Clinical Impression Statement Focused lumbar treatment today. Continued manual trigger point release to bilateral lumbar paraspinals. Continued abdominal activation and strengthening. HMP at end of treatment to  decrease soreness.    PT Next Visit Plan deep tissue techniques to bilat QL and Lumbar paraspinals, transverse abdominis activation/strengthening   PT Home Exercise Plan abdominal engagement in supine & seated, be aware of posture, GHJ IR stretch; doorway stretch   Consulted and Agree with Plan of Care Patient      Patient will benefit from  skilled therapeutic intervention in order to improve the following deficits and impairments:  Decreased range of motion, Impaired UE functional use, Increased muscle spasms, Decreased activity tolerance, Pain, Improper body mechanics, Decreased mobility, Decreased strength, Impaired sensation, Postural dysfunction  Visit Diagnosis: Chronic midline low back pain, with sciatica presence unspecified  Chronic right shoulder pain  Stiffness of right shoulder, not elsewhere classified     Problem List Patient Active Problem List   Diagnosis Date Noted  . Diabetes mellitus without complication (HCC) 08/07/2012  . BACK PAIN, UPPER 09/03/2008  . HYPERTENSION 04/20/2008  . Seizures (HCC) 04/20/2008  . DVT, HX OF 04/20/2008    Sherrie Mustache, PTA 10/04/2016, 1:48 PM  [facility] [address] [place], [place], [address] Phone: [phone]   Fax:  [phone]  Name: Lauren Espinoza MRN: [id number] Date of Birth: January 14, 1971

## 2016-10-04 NOTE — Patient Instructions (Signed)
WE NOW OFFER   [facility] FAST TRACK!!!  SAME DAY Appointments for ACUTE CARE  Such as: Sprains, Injuries, cuts, abrasions, rashes, muscle pain, joint pain, back pain Colds, flu, sore throats, headache, allergies, cough, fever  Ear pain, sinus and eye infections Abdominal pain, nausea, vomiting, diarrhea, upset stomach Animal/insect bites  3 Easy Ways to Schedule: Walk-In Scheduling Call in scheduling Mychart Sign-up: [email or web address]         

## 2016-10-04 NOTE — Progress Notes (Signed)
   Subjective:    Patient ID: Lauren Espinoza, female    DOB: 1970-05-09, 46 y.o.   MRN: [id number]  HPI Here for a wellness exam. She feels fine but she does mention that her BP has been running low. She often has systolic readings from 95 to 105. Her glucoses run from 90 to 110.    Review of Systems  Constitutional: Negative.   HENT: Negative.   Eyes: Negative.   Respiratory: Negative.   Cardiovascular: Negative.   Gastrointestinal: Negative.   Genitourinary: Negative for decreased urine volume, difficulty urinating, dyspareunia, dysuria, enuresis, flank pain, frequency, hematuria, pelvic pain and urgency.  Musculoskeletal: Negative.   Skin: Negative.   Neurological: Negative.   Psychiatric/Behavioral: Negative.        Objective:   Physical Exam  Constitutional: She is oriented to person, place, and time. She appears well-developed and well-nourished. No distress.  HENT:  Head: Normocephalic and atraumatic.  Right Ear: External ear normal.  Left Ear: External ear normal.  Nose: Nose normal.  Mouth/Throat: Oropharynx is clear and moist. No oropharyngeal exudate.  Eyes: Pupils are equal, round, and reactive to light. Conjunctivae and EOM are normal. No scleral icterus.  Neck: Normal range of motion. Neck supple. No JVD present. No thyromegaly present.  Cardiovascular: Normal rate, regular rhythm, normal heart sounds and intact distal pulses.  Exam reveals no gallop and no friction rub.   No murmur heard. Pulmonary/Chest: Effort normal and breath sounds normal. No respiratory distress. She has no wheezes. She has no rales. She exhibits no tenderness.  Abdominal: Soft. Bowel sounds are normal. She exhibits no distension and no mass. There is no tenderness. There is no rebound and no guarding.  Musculoskeletal: Normal range of motion. She exhibits no edema or tenderness.  Lymphadenopathy:    She has no cervical adenopathy.  Neurological: She is alert and oriented to person, place, and  time. She has normal reflexes. No cranial nerve deficit. She exhibits normal muscle tone. Coordination normal.  Skin: Skin is warm and dry. No rash noted. No erythema.  Psychiatric: She has a normal mood and affect. Her behavior is normal. Judgment and thought content normal.          Assessment & Plan:  Well exam. We discussed diet and exercise. Get fasting labs. Since her BP is a bit low we will decrease the Lisinopril to 10 mg daily.  Gershon CraneStephen Fry, MD

## 2016-10-06 ENCOUNTER — Telehealth: Payer: Self-pay | Admitting: Family Medicine

## 2016-10-06 LAB — PHENOBARBITAL LEVEL: Phenobarbital: 25.6 mg/L (ref 15.0–40.0)

## 2016-10-06 NOTE — Telephone Encounter (Signed)
Carollee HerterShannon pt is calling stating that [name]  Per Lanora ManisElizabeth at [facility] is being billed as [facility] and Lanora Manislizabeth will be calling pt back mid next with more information.

## 2016-10-09 ENCOUNTER — Ambulatory Visit: Payer: 59 | Admitting: Physical Therapy

## 2016-10-09 DIAGNOSIS — M545 Low back pain: Secondary | ICD-10-CM | POA: Diagnosis not present

## 2016-10-09 DIAGNOSIS — M25611 Stiffness of right shoulder, not elsewhere classified: Secondary | ICD-10-CM

## 2016-10-09 DIAGNOSIS — G8929 Other chronic pain: Secondary | ICD-10-CM | POA: Diagnosis not present

## 2016-10-09 DIAGNOSIS — M25511 Pain in right shoulder: Secondary | ICD-10-CM | POA: Diagnosis not present

## 2016-10-09 NOTE — Therapy (Signed)
[facility] [address] [place], [place], [address] Phone: [phone]   Fax:  [phone]  Physical Therapy Treatment  Patient Details  Name: Lauren Espinoza MRN: [id number] Date of Birth: November 17, 1970 Referring Provider: Melina Fiddler, MD  Encounter Date: 10/09/2016      PT End of Session - 10/09/16 1346    Visit Number 7   Number of Visits 13   Date for PT Re-Evaluation 10/13/16   Authorization Type [facility]   PT Start Time 0100   PT Stop Time 0145   PT Time Calculation (min) 45 min      Past Medical History:  Diagnosis Date  . BACK PAIN, UPPER 09/03/2008  . Diabetes mellitus   . DVT, HX OF 04/20/2008  . Edema   . Hyperlipidemia   . HYPERTENSION 04/20/2008  . Left shoulder pain    sees Dr Chaney Malling  . SEIZURE DISORDER 04/20/2008   last seizure  1995    Past Surgical History:  Procedure Laterality Date  . CESAREAN SECTION    . CHOLECYSTECTOMY    . ENDOMETRIAL ABLATION  08-09-11   per Dr. Henderson Cloud     There were no vitals filed for this visit.      Subjective Assessment - 10/09/16 1302    Subjective Shoulder is twinging some, not as frozen feeling. Back is about the same.    Currently in Pain? No/denies            [facility] PT Assessment - 10/09/16 0001      PROM   Right Shoulder Flexion 135 Degrees   Right Shoulder ABduction 120 Degrees   Right Shoulder Internal Rotation 45 Degrees   Right Shoulder External Rotation 50 Degrees                     [facility] Adult PT Treatment/Exercise - 10/09/16 0001      Lumbar Exercises: Aerobic   Stationary Bike nu step 5 min L5  UE & LE     Shoulder Exercises: Standing   Row 20 reps   Theraband Level (Shoulder Row) Level 3 (Green)   Other Standing Exercises [place] green x 15 each     Shoulder Exercises: Pulleys   Flexion 2 minutes     Shoulder Exercises: Stretch   Corner Stretch 3 reps;30 seconds   Corner Stretch Limitations high   Internal  Rotation Stretch 3 reps  30 sec    Internal Rotation Stretch Limitations with IR self mob using towel in axilla      Manual Therapy   Manual Therapy Joint mobilization   Joint Mobilization A/P grade 3 inferior glides grade 3, posterior capsule grade 3 followed by PROM all planes                   PT Short Term Goals - 10/09/16 1306      PT SHORT TERM GOAL #1   Title pt will verbalize at least 50% improvement in LBP while seated at work by 7/13   Baseline 25%-30%   Time 4   Period Weeks   Status On-going           PT Long Term Goals - 08/31/16 1516      PT LONG TERM GOAL #1   Title FOTO to 32% limitation to indicate significant improvement in functional ability by 7/27   Baseline 40% limitation at eval   Time 6   Period Weeks   Status New  PT LONG TERM GOAL #2   Title Pt will be able to reach behind to hook her bra with minimal discomfort in shoulder   Baseline unable due to pain and limited ROM   Time 6   Period Weeks   Status New     PT LONG TERM GOAL #3   Title Pt will verbalize improved awareness of posture in exercises and functional activities to provide support to spine and shoulder   Baseline began educating at eval   Time 6   Period Weeks   Status New     PT LONG TERM GOAL #4   Title gross MMT strength at hips and shoulder 4+/5 to provide proper stability to joints   Baseline not tested at hip due to notable pelvic rotation at eval, GHJ 3+/5 gross   Time 6   Period Weeks   Status New               Plan - 10/09/16 1303    Clinical Impression Statement Pt noticed some improvement in her normal lumbar pain after bending and cleaning stuff out at home. She feels this is better now than it would have been prior to PT. Also, pain with lying supine is 7/10 instead of 10/10. Shoulder PROM is improving. Performed shoulder mobs and PROM today followed by therex. Increased IR after treatment, able to reach T-12 along midline.    PT Next Visit  Plan deep tissue techniques to bilat QL and Lumbar paraspinals, transverse abdominis activation/strengthening   PT Home Exercise Plan abdominal engagement in supine & seated, be aware of posture, GHJ IR stretch; doorway stretch, continue manual to lumbar  and shoulder    Consulted and Agree with Plan of Care Patient      Patient will benefit from skilled therapeutic intervention in order to improve the following deficits and impairments:  Decreased range of motion, Impaired UE functional use, Increased muscle spasms, Decreased activity tolerance, Pain, Improper body mechanics, Decreased mobility, Decreased strength, Impaired sensation, Postural dysfunction  Visit Diagnosis: Chronic midline low back pain, with sciatica presence unspecified  Chronic right shoulder pain  Stiffness of right shoulder, not elsewhere classified     Problem List Patient Active Problem List   Diagnosis Date Noted  . Diabetes mellitus without complication (HCC) 08/07/2012  . BACK PAIN, UPPER 09/03/2008  . HYPERTENSION 04/20/2008  . Seizures (HCC) 04/20/2008  . DVT, HX OF 04/20/2008    Sherrie Mustacheonoho, Jessica McGee, PTA 10/09/2016, 1:48 PM  [facility] [address] [place], [place], [address] Phone: [phone]   Fax:  [phone]  Name: Lauren Espinoza MRN: [id number] Date of Birth: [birth year]

## 2016-10-11 ENCOUNTER — Ambulatory Visit: Payer: 59 | Admitting: Physical Therapy

## 2016-10-20 ENCOUNTER — Ambulatory Visit: Payer: 59 | Attending: Sports Medicine | Admitting: Physical Therapy

## 2016-10-20 ENCOUNTER — Encounter: Payer: Self-pay | Admitting: Physical Therapy

## 2016-10-20 DIAGNOSIS — M25611 Stiffness of right shoulder, not elsewhere classified: Secondary | ICD-10-CM | POA: Diagnosis not present

## 2016-10-20 DIAGNOSIS — M25511 Pain in right shoulder: Secondary | ICD-10-CM | POA: Diagnosis not present

## 2016-10-20 DIAGNOSIS — M545 Low back pain: Secondary | ICD-10-CM | POA: Diagnosis not present

## 2016-10-20 DIAGNOSIS — G8929 Other chronic pain: Secondary | ICD-10-CM | POA: Diagnosis not present

## 2016-10-20 MED FILL — tiZANidine HCL 4 MG TABS: 4 | 30 days supply | Qty: 30 | Fill #1

## 2016-10-20 MED FILL — HYDROCHLOROTHIAZIDE 25 MG T: 25 | 90 days supply | Qty: 90 | Fill #2

## 2016-10-20 NOTE — Therapy (Signed)
[facility] [address] [place], [place], [address] Phone: [phone]   Fax:  [phone]  Physical Therapy Treatment / Re-certification   Patient Details  Name: Lauren Espinoza MRN: [id number] Date of Birth: 1970/06/23 Referring Provider: Melina FiddlerBassett, Rebecca S, MD  Encounter Date: 10/20/2016      PT End of Session - 10/20/16 0938    Visit Number 8   Number of Visits 13   Date for PT Re-Evaluation 12/01/16   PT Start Time 0846   PT Stop Time 0938   PT Time Calculation (min) 52 min   Activity Tolerance Patient tolerated treatment well   Behavior During Therapy [facility] for tasks assessed/performed      Past Medical History:  Diagnosis Date  . BACK PAIN, UPPER 09/03/2008  . Diabetes mellitus   . DVT, HX OF 04/20/2008  . Edema   . Hyperlipidemia   . HYPERTENSION 04/20/2008  . Left shoulder pain    sees Dr Chaney MallingMortenson  . SEIZURE DISORDER 04/20/2008   last seizure  1995    Past Surgical History:  Procedure Laterality Date  . CESAREAN SECTION    . CHOLECYSTECTOMY    . ENDOMETRIAL ABLATION  08-09-11   per Dr. Henderson CloudHorvath     There were no vitals filed for this visit.      Subjective Assessment - 10/20/16 0849    Subjective " The pain is more in my mid to upper back, which I am not sure which caused it"    Currently in Pain? Yes   Pain Score 7   took anti-inflammatory at 7 am   Pain Location Back   Pain Orientation Mid;Upper   Pain Onset More than a month ago   Pain Frequency Intermittent   Aggravating Factors  sitting for long periods of time, lifting,    Pain Relieving Factors anti-inflammatory, walking around, heating, pad,                          [facility] Adult PT Treatment/Exercise - 10/20/16 0910      Shoulder Exercises: Seated   Other Seated Exercises thoracic rotation bil 2 x 10 with arms cross   Other Seated Exercises thoracic exstension over back of chair 2 x 10   cue to keep chin tucks      Manual  Therapy   Manual Therapy Myofascial release   Joint Mobilization PA of thoracic spine at T8-T12 Grade 3   Soft tissue mobilization IASTM over the Thoracic region   Myofascial Release fascial stretcing/ rolling over bil thoracolumbar paraspinals           Trigger Point Dry Needling - 10/20/16 [id number]    Consent Given? Yes   Education Handout Provided No  given previously   Muscles Treated Upper Body Longissimus   Longissimus Response Twitch response elicited;Palpable increased muscle length  T8-T11 bil               PT Education - 10/20/16 0937    Education provided Yes   Education Details updated HEP for thoracic mobility    Person(s) Educated Patient   Methods Explanation;Verbal cues   Comprehension Verbalized understanding;Verbal cues required          PT Short Term Goals - 10/20/16 0948      PT SHORT TERM GOAL #1   Title pt will verbalize at least 50% improvement in LBP while seated at work by 7/13   Time 4   Period  Weeks   Status Unable to assess     PT SHORT TERM GOAL #2   Title pt will verbalize complaince in home stretching program to improve shoulder flexibility and decrease stiffness   Time 4   Period Weeks   Status Achieved           PT Long Term Goals - 10/20/16 [id number]      PT LONG TERM GOAL #1   Title FOTO to 32% limitation to indicate significant improvement in functional ability by 7/27   Time 6   Period Weeks   Status Unable to assess     PT LONG TERM GOAL #2   Title Pt will be able to reach behind to hook her bra with minimal discomfort in shoulder   Time 6   Period Weeks   Status Unable to assess     PT LONG TERM GOAL #3   Title Pt will verbalize improved awareness of posture in exercises and functional activities to provide support to spine and shoulder   Time 6   Period Weeks   Status Unable to assess     PT LONG TERM GOAL #4   Title gross MMT strength at hips and shoulder 4+/5 to provide proper stability to joints   Time 6    Period Weeks   Status Unable to assess               Plan - 10/20/16 0941    Clinical Impression Statement pt reported increased pain in mid/ upper back today rated at 7/10. performed DN over bil thoracic paraspinals. continued soft tissue work and mobilizations which she reported decreased pain and tightness. she continues to make progress with treatment plan to continued with current POC.    Rehab Potential Good   PT Frequency 2x / week   PT Duration 6 weeks   PT Treatment/Interventions ADLs/Self Care Home Management;Cryotherapy;Electrical Stimulation;Iontophoresis 4mg /ml Dexamethasone;Moist Heat;Traction;Ultrasound;Gait training;Stair training;Functional mobility training;Patient/family education;Neuromuscular re-education;Balance training;Therapeutic exercise;Therapeutic activities;Manual techniques;Passive range of motion;Taping;Dry needling   PT Next Visit Plan ROM, review goals, FOTO, manaul techniques for QL and lumbar paraspinals, shoulder mobility, core strengthing,    PT Home Exercise Plan abdominal engagement in supine & seated, be aware of posture, GHJ IR stretch; doorway stretch, continue manual to lumbar  and shoulder    Consulted and Agree with Plan of Care Patient      Patient will benefit from skilled therapeutic intervention in order to improve the following deficits and impairments:  Decreased range of motion, Impaired UE functional use, Increased muscle spasms, Decreased activity tolerance, Pain, Improper body mechanics, Decreased mobility, Decreased strength, Impaired sensation, Postural dysfunction  Visit Diagnosis: Chronic midline low back pain, with sciatica presence unspecified  Chronic right shoulder pain  Stiffness of right shoulder, not elsewhere classified     Problem List Patient Active Problem List   Diagnosis Date Noted  . Diabetes mellitus without complication (HCC) 08/07/2012  . BACK PAIN, UPPER 09/03/2008  . HYPERTENSION 04/20/2008  .  Seizures (HCC) 04/20/2008  . DVT, HX OF 04/20/2008   Lulu RidingKristoffer Remmy Crass PT, DPT, LAT, ATC  10/20/16  10:00 AM      [facility] [address] [place], [place], [address] Phone: [phone]   Fax:  [phone]  Name: Lauren Espinoza MRN: [id number] Date of Birth: 07/14/1970

## 2016-10-25 ENCOUNTER — Encounter: Payer: Self-pay | Admitting: Family Medicine

## 2016-10-25 ENCOUNTER — Encounter: Payer: Self-pay | Admitting: Physical Therapy

## 2016-10-25 ENCOUNTER — Ambulatory Visit: Payer: 59 | Admitting: Physical Therapy

## 2016-10-25 DIAGNOSIS — M545 Low back pain: Secondary | ICD-10-CM | POA: Diagnosis not present

## 2016-10-25 DIAGNOSIS — M25511 Pain in right shoulder: Secondary | ICD-10-CM | POA: Diagnosis not present

## 2016-10-25 DIAGNOSIS — G8929 Other chronic pain: Secondary | ICD-10-CM

## 2016-10-25 DIAGNOSIS — M25611 Stiffness of right shoulder, not elsewhere classified: Secondary | ICD-10-CM | POA: Diagnosis not present

## 2016-10-25 NOTE — Therapy (Signed)
[facility] [address] [place], [place], [address] Phone: [phone]   Fax:  [phone]  Physical Therapy Treatment  Patient Details  Name: Lauren Espinoza MRN: [id number] Date of Birth: 22-Sep-1970 Referring Provider: Melina Fiddler, MD  Encounter Date: 10/25/2016      PT End of Session - 10/25/16 1334    Visit Number 9   Number of Visits 13   Date for PT Re-Evaluation 12/01/16   Authorization Type [facility] U[facility]   PT Start Time 1332   PT Stop Time 1418   PT Time Calculation (min) 46 min   Activity Tolerance Patient tolerated treatment well   Behavior During Therapy [facility] for tasks assessed/performed      Past Medical History:  Diagnosis Date  . BACK PAIN, UPPER 09/03/2008  . Diabetes mellitus   . DVT, HX OF 04/20/2008  . Edema   . Hyperlipidemia   . HYPERTENSION 04/20/2008  . Left shoulder pain    sees Dr Chaney Malling  . SEIZURE DISORDER 04/20/2008   last seizure  1995    Past Surgical History:  Procedure Laterality Date  . CESAREAN SECTION    . CHOLECYSTECTOMY    . ENDOMETRIAL ABLATION  08-09-11   per Dr. Henderson Cloud     There were no vitals filed for this visit.      Subjective Assessment - 10/25/16 1334    Subjective Pt reports pain is much better after needling. Shoulder is feeling okay, still limited in reach behind. Leaving tomorrow for vaction for a week.    Patient Stated Goals decrease pain, reach behind    Currently in Pain? Yes   Pain Score 1    Pain Location Back   Pain Orientation Lower            [facility] PT Assessment - 10/25/16 0001      Observation/Other Assessments   Focus on Therapeutic Outcomes (FOTO)  33% limited                     [facility] Adult PT Treatment/Exercise - 10/25/16 0001      Lumbar Exercises: Aerobic   Stationary Bike nu step 5 min L8 UE & LE     Lumbar Exercises: Seated   Hip Flexion on Ball Limitations iso press heels into hands     Lumbar Exercises: Supine   AB  Set Limitations ab set to bilat LE table top   Bent Knee Raise Limitations dead bug ext 2# hand weight   Large Ball Abdominal Isometric 10 reps;5 seconds   Large Ball Oblique Isometric 10 reps;5 seconds   Other Supine Lumbar Exercises physioball overhead squeeze in & flex shoulders   Other Supine Lumbar Exercises HS squeeze on physioball + curl in     Modalities   Modalities Moist Heat     Moist Heat Therapy   Number Minutes Moist Heat 10 Minutes  2 min with poc discussion   Moist Heat Location Lumbar Spine                PT Education - 10/25/16 1411    Education provided Yes   Education Details exercise form/rationale, car ride, POC   Person(s) Educated Patient   Methods Explanation;Demonstration;Tactile cues;Verbal cues   Comprehension Verbalized understanding;Returned demonstration;Verbal cues required;Tactile cues required;Need further instruction          PT Short Term Goals - 10/20/16 0948      PT SHORT TERM GOAL #1   Title pt  will verbalize at least 50% improvement in LBP while seated at work by 7/13   Time 4   Period Weeks   Status Unable to assess     PT SHORT TERM GOAL #2   Title pt will verbalize complaince in home stretching program to improve shoulder flexibility and decrease stiffness   Time 4   Period Weeks   Status Achieved           PT Long Term Goals - 10/20/16 16100951      PT LONG TERM GOAL #1   Title FOTO to 32% limitation to indicate significant improvement in functional ability by 7/27   Time 6   Period Weeks   Status Unable to assess     PT LONG TERM GOAL #2   Title Pt will be able to reach behind to hook her bra with minimal discomfort in shoulder   Time 6   Period Weeks   Status Unable to assess     PT LONG TERM GOAL #3   Title Pt will verbalize improved awareness of posture in exercises and functional activities to provide support to spine and shoulder   Time 6   Period Weeks   Status Unable to assess     PT LONG TERM  GOAL #4   Title gross MMT strength at hips and shoulder 4+/5 to provide proper stability to joints   Time 6   Period Weeks   Status Unable to assess               Plan - 10/25/16 1409    Clinical Impression Statement Good tolerance to activation of core. Soreness in lumbar region as expected with increased use of abdominals. Discussed care of back while in car. Will continue as appropraite when pt returns from vacation.    PT Treatment/Interventions ADLs/Self Care Home Management;Cryotherapy;Electrical Stimulation;Iontophoresis 4mg /ml Dexamethasone;Moist Heat;Traction;Ultrasound;Gait training;Stair training;Functional mobility training;Patient/family education;Neuromuscular re-education;Balance training;Therapeutic exercise;Therapeutic activities;Manual techniques;Passive range of motion;Taping;Dry needling   PT Next Visit Plan shoulder mobility, core strengthing,    PT Home Exercise Plan abdominal engagement in supine & seated, be aware of posture, GHJ IR stretch; doorway stretch, continue manual to lumbar  and shoulder    Consulted and Agree with Plan of Care Patient      Patient will benefit from skilled therapeutic intervention in order to improve the following deficits and impairments:  Decreased range of motion, Impaired UE functional use, Increased muscle spasms, Decreased activity tolerance, Pain, Improper body mechanics, Decreased mobility, Decreased strength, Impaired sensation, Postural dysfunction  Visit Diagnosis: Chronic midline low back pain, with sciatica presence unspecified  Chronic right shoulder pain  Stiffness of right shoulder, not elsewhere classified     Problem List Patient Active Problem List   Diagnosis Date Noted  . Diabetes mellitus without complication (HCC) 08/07/2012  . BACK PAIN, UPPER 09/03/2008  . HYPERTENSION 04/20/2008  . Seizures (HCC) 04/20/2008  . DVT, HX OF 04/20/2008    Annalese Stiner C. Linnae Rasool PT, DPT 10/25/16 2:12 PM   [facility] [address] [place], [place], [address] Phone: [phone]   Fax:  [phone]  Name: Lauren Espinoza MRN: [id number] Date of Birth: 11/09/1970

## 2016-10-25 NOTE — Telephone Encounter (Signed)
Call in Diflucan 150 mg to take as needed, #2 with 5 rf  

## 2016-10-26 ENCOUNTER — Telehealth: Payer: Self-pay | Admitting: Family Medicine

## 2016-10-26 MED ORDER — FLUCONAZOLE 150 MG PO TABS: 150.0000 mg | ORAL_TABLET | Freq: Once | ORAL | 5 refills | Status: AC

## 2016-10-26 NOTE — Telephone Encounter (Signed)
duplicate

## 2016-11-07 ENCOUNTER — Ambulatory Visit: Payer: 59 | Admitting: Physical Therapy

## 2016-11-09 ENCOUNTER — Ambulatory Visit: Payer: 59 | Admitting: Physical Therapy

## 2016-11-09 ENCOUNTER — Encounter: Payer: Self-pay | Admitting: Physical Therapy

## 2016-11-09 DIAGNOSIS — M545 Low back pain: Principal | ICD-10-CM

## 2016-11-09 DIAGNOSIS — M25611 Stiffness of right shoulder, not elsewhere classified: Secondary | ICD-10-CM

## 2016-11-09 DIAGNOSIS — G8929 Other chronic pain: Secondary | ICD-10-CM

## 2016-11-09 DIAGNOSIS — M25511 Pain in right shoulder: Secondary | ICD-10-CM | POA: Diagnosis not present

## 2016-11-09 NOTE — Therapy (Signed)
[facility] [address] [place], [place], [address] Phone: [phone]   Fax:  [phone]  Physical Therapy Treatment  Patient Details  Name: Lauren Espinoza MRN: [id number] Date of Birth: 03-19-1971 Referring Provider: Melina Fiddler, MD  Encounter Date: 11/09/2016      PT End of Session - 11/09/16 1332    Visit Number 10   Number of Visits 13   Date for PT Re-Evaluation 12/01/16   Authorization Type [facility] UMR   PT Start Time 1332   PT Stop Time 1424   PT Time Calculation (min) 52 min   Activity Tolerance Patient tolerated treatment well   Behavior During Therapy [facility] for tasks assessed/performed      Past Medical History:  Diagnosis Date  . BACK PAIN, UPPER 09/03/2008  . Diabetes mellitus   . DVT, HX OF 04/20/2008  . Edema   . Hyperlipidemia   . HYPERTENSION 04/20/2008  . Left shoulder pain    sees Dr Chaney Malling  . SEIZURE DISORDER 04/20/2008   last seizure  1995    Past Surgical History:  Procedure Laterality Date  . CESAREAN SECTION    . CHOLECYSTECTOMY    . ENDOMETRIAL ABLATION  08-09-11   per Dr. Henderson Cloud     There were no vitals filed for this visit.      Subjective Assessment - 11/09/16 1332    Subjective Felt okay on vacation with long car ride. Lower back is not as bad as it was. Pain cont when she lays flat or when she overdoes it. Feels exercises are helpful.    Currently in Pain? Yes   Pain Score 1    Pain Location Back   Pain Orientation Lower   Pain Descriptors / Indicators Sore   Aggravating Factors  sitting, laying supine                         [facility] Adult PT Treatment/Exercise - 11/09/16 0001      Exercises   Exercises Other Exercises   Other Exercises  reformer- see note     Lumbar Exercises: Stretches   Passive Hamstring Stretch Limitations seated edge of chair.      Moist Heat Therapy   Number Minutes Moist Heat 15 Minutes  3 min concurrent with education   Moist  Heat Location Lumbar Spine        Reformer: bilat & single leg press 2R1B Turnout press away Press away on toes 2R1B static bridges 2R1B active hamstring stretch Arm work1R1B, legs in TT Feet in straps 2R1B      PT Education - 11/09/16 1413    Education provided Yes   Education Details progression & continued complaints, discussed adhesive caps v tear & thoughts on having MRI, exercise form/rationale, work Arts development officer) Educated Patient   Methods Explanation;Demonstration;Tactile cues;Verbal cues   Comprehension Verbalized understanding;Returned demonstration;Verbal cues required;Tactile cues required;Need further instruction          PT Short Term Goals - 10/20/16 0948      PT SHORT TERM GOAL #1   Title pt will verbalize at least 50% improvement in LBP while seated at work by 7/13   Time 4   Period Weeks   Status Unable to assess     PT SHORT TERM GOAL #2   Title pt will verbalize complaince in home stretching program to improve shoulder flexibility and decrease stiffness   Time 4   Period Weeks  Status Achieved           PT Long Term Goals - 10/20/16 0951      PT LONG TERM GOAL #1   Title FOTO to 32% limitation to indicate significant improvement in functional ability by 7/27   Time 6   Period Weeks   Status Unable to assess     PT LONG TERM GOAL #2   Title Pt will be able to reach behind to hook her bra with minimal discomfort in shoulder   Time 6   Period Weeks   Status Unable to assess     PT LONG TERM GOAL #3   Title Pt will verbalize improved awareness of posture in exercises and functional activities to provide support to spine and shoulder   Time 6   Period Weeks   Status Unable to assess     PT LONG TERM GOAL #4   Title gross MMT strength at hips and shoulder 4+/5 to provide proper stability to joints   Time 6   Period Weeks   Status Unable to assess               Plan - 11/09/16 1412    Clinical Impression  Statement Challenged core utilizing reformer today. Denied pain following. Will check to see if subscap will benefit from DN next visit for reach behind which has decreased again since returning to work on a computer. Is placing request for standing desk. Notable hamstring tightness bilat.    PT Treatment/Interventions ADLs/Self Care Home Management;Cryotherapy;Electrical Stimulation;Iontophoresis 4mg /ml Dexamethasone;Moist Heat;Traction;Ultrasound;Gait training;Stair training;Functional mobility training;Patient/family education;Neuromuscular re-education;Balance training;Therapeutic exercise;Therapeutic activities;Manual techniques;Passive range of motion;Taping;Dry needling   PT Next Visit Plan core strengthening, DN subscap?   PT Home Exercise Plan abdominal engagement in supine & seated, be aware of posture, GHJ IR stretch; doorway stretch, continue manual to lumbar  and shoulder    Consulted and Agree with Plan of Care Patient      Patient will benefit from skilled therapeutic intervention in order to improve the following deficits and impairments:  Decreased range of motion, Impaired UE functional use, Increased muscle spasms, Decreased activity tolerance, Pain, Improper body mechanics, Decreased mobility, Decreased strength, Impaired sensation, Postural dysfunction  Visit Diagnosis: Chronic midline low back pain, with sciatica presence unspecified  Chronic right shoulder pain  Stiffness of right shoulder, not elsewhere classified     Problem List Patient Active Problem List   Diagnosis Date Noted  . Diabetes mellitus without complication (HCC) 08/07/2012  . BACK PAIN, UPPER 09/03/2008  . HYPERTENSION 04/20/2008  . Seizures (HCC) 04/20/2008  . DVT, HX OF 04/20/2008   Margret Moat C. Kipper Buch PT, DPT 11/09/16 2:20 PM   [facility] [address] [place], [place], [address] Phone: [phone]   Fax:  [phone]  Name: Misao Fackrell MRN: [id number] Date of Birth: Jul 19, 1970

## 2016-11-13 MED FILL — PHENobarbital 97.2 MG TABS: 97.2 | 90 days supply | Qty: 180 | Fill #1

## 2016-11-22 ENCOUNTER — Ambulatory Visit: Payer: 59 | Attending: Sports Medicine | Admitting: Physical Therapy

## 2016-11-22 ENCOUNTER — Encounter: Payer: Self-pay | Admitting: Physical Therapy

## 2016-11-22 DIAGNOSIS — G8929 Other chronic pain: Secondary | ICD-10-CM | POA: Insufficient documentation

## 2016-11-22 DIAGNOSIS — M25611 Stiffness of right shoulder, not elsewhere classified: Secondary | ICD-10-CM | POA: Diagnosis not present

## 2016-11-22 DIAGNOSIS — M25511 Pain in right shoulder: Secondary | ICD-10-CM | POA: Insufficient documentation

## 2016-11-22 DIAGNOSIS — M545 Low back pain: Secondary | ICD-10-CM | POA: Insufficient documentation

## 2016-11-22 NOTE — Therapy (Addendum)
[place], [place], [address] Phone: [phone]   Fax:  [phone]  Physical Therapy Treatment/Discharge Summary  Patient Details  Name: Lauren Espinoza MRN: [id number] Date of Birth: Apr 19, 1970 Referring Provider: Verner Chol, MD  Encounter Date: 11/22/2016      PT End of Session - 11/22/16 1332    Visit Number 11   Number of Visits 13   Date for PT Re-Evaluation 12/01/16   Authorization Type [facility] UMR   PT Start Time 1332   PT Stop Time 1411   PT Time Calculation (min) 39 min   Activity Tolerance Patient tolerated treatment well   Behavior During Therapy [facility] for tasks assessed/performed      Past Medical History:  Diagnosis Date  . BACK PAIN, UPPER 09/03/2008  . Diabetes mellitus   . DVT, HX OF 04/20/2008  . Edema   . Hyperlipidemia   . HYPERTENSION 04/20/2008  . Left shoulder pain    sees Dr Alphonzo Cruise  . SEIZURE DISORDER 04/20/2008   last seizure  1995    Past Surgical History:  Procedure Laterality Date  . CESAREAN SECTION    . CHOLECYSTECTOMY    . ENDOMETRIAL ABLATION  08-09-11   per Dr. Philis Pique     There were no vitals filed for this visit.      Subjective Assessment - 11/22/16 1332    Subjective Back is feeling pretty good, was on vacation last weekend. Only discomfort in AM. Massage therapist worked on forearm and released up to shoulder. still feels anterior shoulder pain when reaching behind.    Patient Stated Goals decrease pain, reach behind    Currently in Pain? No/denies                         [facility] Adult PT Treatment/Exercise - 11/22/16 0001      Shoulder Exercises: Sidelying   Internal Rotation Limitations reach behind back 2#, towel roll under arm   Other Sidelying Exercises resisted IR red tband     Shoulder Exercises: Standing   Internal Rotation Limitations behind back reach with 2#, towel under arm   Extension Weight (lbs) 2   Extension  Limitations towel under arm     Shoulder Exercises: Stretch   Internal Rotation Stretch Limitations with strap     Manual Therapy   Joint Mobilization IR mobs in supine & standing with stretch strap                PT Education - 11/22/16 1414    Education provided Yes   Education Details exercise form/rationale, rationale for manual therapy, anatomy & physiology of movement, HEP   Person(s) Educated Patient   Methods Explanation;Demonstration;Tactile cues;Verbal cues;Handout   Comprehension Verbalized understanding;Returned demonstration;Verbal cues required;Tactile cues required;Need further instruction          PT Short Term Goals - 10/20/16 0948      PT SHORT TERM GOAL #1   Title pt will verbalize at least 50% improvement in LBP while seated at work by 7/13   Time 4   Period Weeks   Status Unable to assess     PT SHORT TERM GOAL #2   Title pt will verbalize complaince in home stretching program to improve shoulder flexibility and decrease stiffness   Time 4   Period Weeks   Status Achieved           PT Long Term Goals - 10/20/16 9323  PT LONG TERM GOAL #1   Title FOTO to 32% limitation to indicate significant improvement in functional ability by 7/27   Time 6   Period Weeks   Status Unable to assess     PT LONG TERM GOAL #2   Title Pt will be able to reach behind to hook her bra with minimal discomfort in shoulder   Time 6   Period Weeks   Status Unable to assess     PT LONG TERM GOAL #3   Title Pt will verbalize improved awareness of posture in exercises and functional activities to provide support to spine and shoulder   Time 6   Period Weeks   Status Unable to assess     PT LONG TERM GOAL #4   Title gross MMT strength at hips and shoulder 4+/5 to provide proper stability to joints   Time 6   Period Weeks   Status Unable to assess               Plan - 11/22/16 1337    Clinical Impression Statement Limited IR passive with  hard end feel, tightness reported in passive ER. GHJ elevation with reach behind back. exessive scapular elevation with GHJ flexion. Pt was able to reach and touch one finger tip to bra strap in IR following treatment today without GHJ elevation. Towels utilized to gap joint during movement. Limited in extension strength to allow for control of IR.    PT Treatment/Interventions ADLs/Self Care Home Management;Cryotherapy;Electrical Stimulation;Iontophoresis 61m/ml Dexamethasone;Moist Heat;Traction;Ultrasound;Gait training;Stair training;Functional mobility training;Patient/family education;Neuromuscular re-education;Balance training;Therapeutic exercise;Therapeutic activities;Manual techniques;Passive range of motion;Taping;Dry needling   PT Next Visit Plan standing IR mobs PRN, GHJ extension strengthening; re-eval & discuss POC   PT Home Exercise Plan abdominal engagement in supine & seated, be aware of posture, GHJ IR stretch; doorway stretch, continue manual to lumbar  and shoulder; sleeper stretch, IR stretch with strap & ext reach with towel under arm.    Consulted and Agree with Plan of Care Patient      Patient will benefit from skilled therapeutic intervention in order to improve the following deficits and impairments:  Decreased range of motion, Impaired UE functional use, Increased muscle spasms, Decreased activity tolerance, Pain, Improper body mechanics, Decreased mobility, Decreased strength, Impaired sensation, Postural dysfunction  Visit Diagnosis: Chronic midline low back pain, with sciatica presence unspecified  Chronic right shoulder pain  Stiffness of right shoulder, not elsewhere classified     Problem List Patient Active Problem List   Diagnosis Date Noted  . Diabetes mellitus without complication ([facility] 025/36/6440 . BACK PAIN, UPPER 09/03/2008  . HYPERTENSION 04/20/2008  . Seizures ([place] 04/20/2008  . DVT, HX OF 04/20/2008    Lauren Espinoza PT, DPT 11/22/16  2:15 PM   [facility] [place] [address]Phone: [phone]  Fax:  [phone] Name: Lauren MickleMRN: [id number] of Birth: [birth year]  PHYSICAL THERAPY DISCHARGE SUMMARY  Visits from Start of Care: 11  Current functional level related to goals / functional outcomes: See above   Remaining deficits: See above   Education / Equipment: Anatomy of condition, POC, HEP, exercise form/rationale  Plan: Patient agrees to discharge.  Patient goals were not met. Patient is being discharged due to not returning since the last visit.  ?????     Rawlins Stuard C. Daleisa Halperin PT, DPT 12/20/16 10:45 AM

## 2016-11-24 ENCOUNTER — Ambulatory Visit: Payer: 59 | Admitting: Physical Therapy

## 2016-11-27 ENCOUNTER — Ambulatory Visit: Payer: 59 | Admitting: Physical Therapy

## 2016-11-29 ENCOUNTER — Ambulatory Visit: Payer: 59 | Admitting: Physical Therapy

## 2016-11-29 ENCOUNTER — Ambulatory Visit (INDEPENDENT_AMBULATORY_CARE_PROVIDER_SITE_OTHER): Payer: Self-pay | Admitting: Nurse Practitioner

## 2016-11-29 VITALS — BP 128/74 | HR 85 | Temp 98.4°F | Resp 16

## 2016-11-29 DIAGNOSIS — J0111 Acute recurrent frontal sinusitis: Secondary | ICD-10-CM

## 2016-11-29 DIAGNOSIS — J209 Acute bronchitis, unspecified: Secondary | ICD-10-CM

## 2016-11-29 DIAGNOSIS — J0101 Acute recurrent maxillary sinusitis: Secondary | ICD-10-CM

## 2016-11-29 MED ORDER — CEFDINIR 300 MG PO CAPS: 300.0000 mg | ORAL_CAPSULE | Freq: Two times a day (BID) | ORAL | 0 refills | Status: AC

## 2016-11-29 MED ORDER — HYDROCODONE-HOMATROPINE 5-1.5 MG/5ML PO SYRP: 5.0000 mL | ORAL_SOLUTION | Freq: Four times a day (QID) | ORAL | 0 refills | Status: AC | PRN

## 2016-11-29 MED ORDER — ALBUTEROL SULFATE HFA 108 (90 BASE) MCG/ACT IN AERS: 2.0000 | INHALATION_SPRAY | Freq: Four times a day (QID) | RESPIRATORY_TRACT | 0 refills | Status: DC | PRN

## 2016-11-29 MED ORDER — BENZONATATE 100 MG PO CAPS: 100.0000 mg | ORAL_CAPSULE | Freq: Three times a day (TID) | ORAL | 0 refills | Status: AC | PRN

## 2016-11-29 MED FILL — BENZONATATE 100 MG CAP: 100 | 10 days supply | Qty: 30 | Fill #0

## 2016-11-29 MED FILL — HYDROCODONE-HOMATROPINE SYR: 5-1.5 | 6 days supply | Qty: 120 | Fill #0

## 2016-11-29 MED FILL — VENTOLIN HFA 90 MCG INHALER: 108 (90 BAS | 25 days supply | Qty: 18 | Fill #0

## 2016-11-29 MED FILL — CEFDINIR 300 MG CAPSULE: 300 | 10 days supply | Qty: 20 | Fill #0

## 2016-11-29 NOTE — Progress Notes (Signed)
Subjective:  Lauren Espinoza is a 46 y.o. female who presents for evaluation of possible sinusitis.  Symptoms include cough described as productive of green sputum, chills, facial pain, fever: low grade fevers, nasal congestion, post nasal drip, sinus pressure, sinus pain, sneezing, sore throat and bilateral ear pain.  Onset of symptoms was 7 days ago, and has been gradually worsening since that time.  Treatment to date:  Sudafed with minimal relief.  High risk factors for influenza complications:  been around people that have been ill.. Patient has a history o DM, seizures and HTN.  Last seizures was in 1995 and DM is under control, A1C 5.8.  The following portions of the patient's history were reviewed and updated as appropriate:  allergies, current medications and past medical history.  Constitutional: positive for chills, fatigue and fevers, negative for anorexia and sweats Eyes: negative Ears, nose, mouth, throat, and face: positive for earaches and muffled hearing and popping in ears, negative for ear drainage and hoarseness Respiratory: positive for cough and history of bronchitis Neurological: positive for headaches and frontal HA, rates 8/10 Allergic/Immunologic: positive for hay fever Objective:  BP 128/74 (BP Location: Right Arm, Patient Position: Sitting, Cuff Size: Normal)   Pulse 85   Temp 98.4 F (36.9 C) (Oral)   Resp 16   SpO2 98%  General appearance: alert, cooperative, fatigued and mild distress Head: Normocephalic, without obvious abnormality, atraumatic Eyes: conjunctivae/corneas clear. PERRL, EOM's intact. Fundi benign. Ears: abnormal TM right ear - erythematous and abnormal TM left ear - moderate middle ear effusion Nose: turbinates red, swollen Throat: lips, mucosa, and tongue normal; teeth and gums normal Lungs: rhonchi posterior - right Heart: regular rate and rhythm, S1, S2 normal, no murmur, click, rub or gallop Neurologic: Grossly normal    Assessment:  sinusitis  and Acute Bronchitis    Plan:  Discussed diagnosis and treatment of sinusitis. Educational material distributed and questions answered. Suggested symptomatic OTC remedies. Ceftin per orders. Nasal steroids per orders. Follow up as needed. Flonase 2 sprays per nostril daily for 10 days.  Tylenol and Ibuprofen for pain, fever, or general discomfort.  Patient to f/u in 2 days if no improvement.  Go to ER if SOB, difficulty breathng, etc. Patient verbalizes understanding.

## 2016-11-29 NOTE — Patient Instructions (Addendum)
Acute Bronchitis, Adult Acute bronchitis is sudden (acute) swelling of the air tubes (bronchi) in the lungs. Acute bronchitis causes these tubes to fill with mucus, which can make it hard to breathe. It can also cause coughing or wheezing. In adults, acute bronchitis usually goes away within 2 weeks. A cough caused by bronchitis may last up to 3 weeks. Smoking, allergies, and asthma can make the condition worse. Repeated episodes of bronchitis may cause further lung problems, such as chronic obstructive pulmonary disease (COPD). What are the causes? This condition can be caused by germs and by substances that irritate the lungs, including:  Cold and flu viruses. This condition is most often caused by the same virus that causes a cold.  Bacteria.  Exposure to tobacco smoke, dust, fumes, and air pollution. What increases the risk? This condition is more likely to develop in people who:  Have close contact with someone with acute bronchitis.  Are exposed to lung irritants, such as tobacco smoke, dust, fumes, and vapors.  Have a weak immune system.  Have a respiratory condition such as asthma. What are the signs or symptoms? Symptoms of this condition include:  A cough.  Coughing up clear, yellow, or green mucus.  Wheezing.  Chest congestion.  Shortness of breath.  A fever.  Body aches.  Chills.  A sore throat. How is this diagnosed? This condition is usually diagnosed with a physical exam. During the exam, your health care provider may order tests, such as chest X-rays, to rule out other conditions. He or she may also:  Test a sample of your mucus for bacterial infection.  Check the level of oxygen in your blood. This is done to check for pneumonia.  Do a chest X-ray or lung function testing to rule out pneumonia and other conditions.  Perform blood tests. Your health care provider will also ask about your symptoms and medical history. How is this treated? Most cases  of acute bronchitis clear up over time without treatment. Your health care provider may recommend:  Drinking more fluids. Drinking more makes your mucus thinner, which may make it easier to breathe.  Taking a medicine for a fever or cough.  Taking an antibiotic medicine.  Using an inhaler to help improve shortness of breath and to control a cough.  Using a cool mist vaporizer or humidifier to make it easier to breathe. Follow these instructions at home: Medicines  Take over-the-counter and prescription medicines only as told by your health care provider.  If you were prescribed an antibiotic, take it as told by your health care provider. Do not stop taking the antibiotic even if you start to feel better. General instructions  Get plenty of rest.  Drink enough fluids to keep your urine clear or pale yellow.  Avoid smoking and secondhand smoke. Exposure to cigarette smoke or irritating chemicals will make bronchitis worse. If you smoke and you need help quitting, ask your health care provider. Quitting smoking will help your lungs heal faster.  Use an inhaler, cool mist vaporizer, or humidifier as told by your health care provider.  Keep all follow-up visits as told by your health care provider. This is important. How is this prevented? To lower your risk of getting this condition again:  Wash your hands often with soap and water. If soap and water are not available, use hand sanitizer.  Avoid contact with people who have cold symptoms.  Try not to touch your hands to your mouth, nose, or eyes.    Make sure to get the flu shot every year. Contact a health care provider if:  Your symptoms do not improve in 2 weeks of treatment. Get help right away if:  You cough up blood.  You have chest pain.  You have severe shortness of breath.  You become dehydrated.  You faint or keep feeling like you are going to faint.  You keep vomiting.  You have a severe headache.  Your  fever or chills gets worse. This information is not intended to replace advice given to you by your health care provider. Make sure you discuss any questions you have with your health care provider. Document Released: 04/13/2004 Document Revised: 09/29/2015 Document Reviewed: 08/25/2015 Elsevier Interactive Patient Education  2017 Elsevier Inc. Sinusitis, Adult Sinusitis is soreness and inflammation of your sinuses. Sinuses are hollow spaces in the bones around your face. Your sinuses are located:  Around your eyes.  In the middle of your forehead.  Behind your nose.  In your cheekbones. Your sinuses and nasal passages are lined with a stringy fluid (mucus). Mucus normally drains out of your sinuses. When your nasal tissues become inflamed or swollen, the mucus can become trapped or blocked so air cannot flow through your sinuses. This allows bacteria, viruses, and funguses to grow, which leads to infection. Sinusitis can develop quickly and last for 7?10 days (acute) or for more than 12 weeks (chronic). Sinusitis often develops after a cold. What are the causes? This condition is caused by anything that creates swelling in the sinuses or stops mucus from draining, including:  Allergies.  Asthma.  Bacterial or viral infection.  Abnormally shaped bones between the nasal passages.  Nasal growths that contain mucus (nasal polyps).  Narrow sinus openings.  Pollutants, such as chemicals or irritants in the air.  A foreign object stuck in the nose.  A fungal infection. This is rare. What increases the risk? The following factors may make you more likely to develop this condition:  Having allergies or asthma.  Having had a recent cold or respiratory tract infection.  Having structural deformities or blockages in your nose or sinuses.  Having a weak immune system.  Doing a lot of swimming or diving.  Overusing nasal sprays.  Smoking. What are the signs or symptoms? The  main symptoms of this condition are pain and a feeling of pressure around the affected sinuses. Other symptoms include:  Upper toothache.  Earache.  Headache.  Bad breath.  Decreased sense of smell and taste.  A cough that may get worse at night.  Fatigue.  Fever.  Thick drainage from your nose. The drainage is often green and it may contain pus (purulent).  Stuffy nose or congestion.  Postnasal drip. This is when extra mucus collects in the throat or back of the nose.  Swelling and warmth over the affected sinuses.  Sore throat.  Sensitivity to light. How is this diagnosed? This condition is diagnosed based on symptoms, a medical history, and a physical exam. To find out if your condition is acute or chronic, your health care provider may:  Look in your nose for signs of nasal polyps.  Tap over the affected sinus to check for signs of infection.  View the inside of your sinuses using an imaging device that has a light attached (endoscope). If your health care provider suspects that you have chronic sinusitis, you may also:  Be tested for allergies.  Have a sample of mucus taken from your nose (nasal culture) and   checked for bacteria.  Have a mucus sample examined to see if your sinusitis is related to an allergy. If your sinusitis does not respond to treatment and it lasts longer than 8 weeks, you may have an MRI or CT scan to check your sinuses. These scans also help to determine how severe your infection is. In rare cases, a bone biopsy may be done to rule out more serious types of fungal sinus disease. How is this treated? Treatment for sinusitis depends on the cause and whether your condition is chronic or acute. If a virus is causing your sinusitis, your symptoms will go away on their own within 10 days. You may be given medicines to relieve your symptoms, including:  Topical nasal decongestants. They shrink swollen nasal passages and let mucus drain from your  sinuses.  Antihistamines. These drugs block inflammation that is triggered by allergies. This can help to ease swelling in your nose and sinuses.  Topical nasal corticosteroids. These are nasal sprays that ease inflammation and swelling in your nose and sinuses.  Nasal saline washes. These rinses can help to get rid of thick mucus in your nose. If your condition is caused by bacteria, you will be given an antibiotic medicine. If your condition is caused by a fungus, you will be given an antifungal medicine. Surgery may be needed to correct underlying conditions, such as narrow nasal passages. Surgery may also be needed to remove polyps. Follow these instructions at home: Medicines  Take, use, or apply over-the-counter and prescription medicines only as told by your health care provider. These may include nasal sprays.  If you were prescribed an antibiotic medicine, take it as told by your health care provider. Do not stop taking the antibiotic even if you start to feel better. Hydrate and Humidify  Drink enough water to keep your urine clear or pale yellow. Staying hydrated will help to thin your mucus.  Use a cool mist humidifier to keep the humidity level in your home above 50%.  Inhale steam for 10-15 minutes, 3-4 times a day or as told by your health care provider. You can do this in the bathroom while a hot shower is running.  Limit your exposure to cool or dry air. Rest  Rest as much as possible.  Sleep with your head raised (elevated).  Make sure to get enough sleep each night. General instructions  Apply a warm, moist washcloth to your face 3-4 times a day or as told by your health care provider. This will help with discomfort.  Wash your hands often with soap and water to reduce your exposure to viruses and other germs. If soap and water are not available, use hand sanitizer.  Do not smoke. Avoid being around people who are smoking (secondhand smoke).  Keep all  follow-up visits as told by your health care provider. This is important. Contact a health care provider if:  You have a fever.  Your symptoms get worse.  Your symptoms do not improve within 10 days. Get help right away if:  You have a severe headache.  You have persistent vomiting.  You have pain or swelling around your face or eyes.  You have vision problems.  You develop confusion.  Your neck is stiff.  You have trouble breathing. This information is not intended to replace advice given to you by your health care provider. Make sure you discuss any questions you have with your health care provider. Document Released: 03/06/2005 Document Revised: 10/31/2015 Document Reviewed:   12/30/2014 Elsevier Interactive Patient Education  2017 Elsevier Inc.  

## 2016-12-01 ENCOUNTER — Telehealth: Payer: Self-pay

## 2016-12-07 ENCOUNTER — Encounter: Payer: Self-pay | Admitting: Family Medicine

## 2016-12-14 DIAGNOSIS — L720 Epidermal cyst: Secondary | ICD-10-CM | POA: Diagnosis not present

## 2016-12-18 MED FILL — metFORMIN HCL 500 MG TABS: 500 | 90 days supply | Qty: 180 | Fill #1

## 2016-12-18 MED FILL — ATORVASTATIN 40 MG TABLET: 40 | 90 days supply | Qty: 90 | Fill #0

## 2016-12-28 DIAGNOSIS — Z1231 Encounter for screening mammogram for malignant neoplasm of breast: Secondary | ICD-10-CM | POA: Diagnosis not present

## 2016-12-28 DIAGNOSIS — N951 Menopausal and female climacteric states: Secondary | ICD-10-CM | POA: Diagnosis not present

## 2016-12-28 DIAGNOSIS — Z124 Encounter for screening for malignant neoplasm of cervix: Secondary | ICD-10-CM | POA: Diagnosis not present

## 2016-12-28 DIAGNOSIS — Z01419 Encounter for gynecological examination (general) (routine) without abnormal findings: Secondary | ICD-10-CM | POA: Diagnosis not present

## 2017-01-12 MED FILL — HYDROCHLOROTHIAZIDE 25 MG T: 25 | 90 days supply | Qty: 90 | Fill #0

## 2017-02-02 DIAGNOSIS — M25511 Pain in right shoulder: Secondary | ICD-10-CM | POA: Diagnosis not present

## 2017-02-02 DIAGNOSIS — M5106 Intervertebral disc disorders with myelopathy, lumbar region: Secondary | ICD-10-CM | POA: Diagnosis not present

## 2017-02-05 MED FILL — tiZANidine HCL 4 MG TABS: 4 | 30 days supply | Qty: 30 | Fill #0

## 2017-02-13 ENCOUNTER — Other Ambulatory Visit: Payer: Self-pay | Admitting: Sports Medicine

## 2017-02-13 DIAGNOSIS — S43431A Superior glenoid labrum lesion of right shoulder, initial encounter: Secondary | ICD-10-CM

## 2017-02-13 DIAGNOSIS — M5416 Radiculopathy, lumbar region: Secondary | ICD-10-CM

## 2017-02-19 ENCOUNTER — Other Ambulatory Visit: Payer: Self-pay | Admitting: Family Medicine

## 2017-02-19 MED FILL — DOXYCYCLINE HYCLATE 100 MG: 100 | 10 days supply | Qty: 20 | Fill #0

## 2017-02-19 MED FILL — LISINOPRIL 10 MG TABS: 10 | 90 days supply | Qty: 90 | Fill #1

## 2017-02-20 ENCOUNTER — Ambulatory Visit
Admission: RE | Admit: 2017-02-20 | Discharge: 2017-02-20 | Disposition: A | Discharge status: Home / Self Care | Payer: 59 | Source: Ambulatory Visit | Attending: Sports Medicine | Admitting: Sports Medicine

## 2017-02-20 DIAGNOSIS — M48061 Spinal stenosis, lumbar region without neurogenic claudication: Secondary | ICD-10-CM | POA: Diagnosis not present

## 2017-02-20 DIAGNOSIS — M5416 Radiculopathy, lumbar region: Secondary | ICD-10-CM

## 2017-02-20 NOTE — Telephone Encounter (Signed)
Call in #180 with one rf  

## 2017-02-20 NOTE — Telephone Encounter (Signed)
Sent to PCP for approval.  

## 2017-02-21 ENCOUNTER — Other Ambulatory Visit: Payer: Self-pay | Admitting: Family Medicine

## 2017-02-21 ENCOUNTER — Encounter: Payer: Self-pay | Admitting: Family Medicine

## 2017-02-21 MED FILL — PHENobarbital 97.2 MG TABS: 97.2 | 90 days supply | Qty: 180 | Fill #0

## 2017-02-21 NOTE — Telephone Encounter (Signed)
Copied from CRM [id number]. Topic: Quick Communication - Rx Refill/Question >> Feb 21, 2017 10:34 AM Landry MellowFoltz, Melissa J wrote: Has the patient contacted their pharmacy? Yes.  Pharmacy calling    (Agent: If no, request that the patient contact the pharmacy for the refill.)   Preferred Pharmacy (with phone number or street name): [facility] [phone], ask for susan.  They are calling in regards to rx that was phoned in for lamictal - it is in a strength that is not made, and there is an order for phenobarbital. She is requesting that the cma call and ask for susan, not leave a voicemail.     Agent: Please be advised that RX refills may take up to 3 business days. We ask that you follow-up with your pharmacy.

## 2017-02-21 NOTE — Telephone Encounter (Signed)
Prescription clarification; see CRM # [id number]; Please contact Darl PikesSusan at [phone] (DO NOT LEAVE A MESSAGE)

## 2017-02-22 NOTE — Telephone Encounter (Signed)
Done called pharmacy and made corrections

## 2017-02-26 ENCOUNTER — Other Ambulatory Visit: Payer: 59

## 2017-02-27 DIAGNOSIS — L720 Epidermal cyst: Secondary | ICD-10-CM | POA: Diagnosis not present

## 2017-03-02 ENCOUNTER — Other Ambulatory Visit: Payer: Self-pay | Admitting: Sports Medicine

## 2017-03-02 DIAGNOSIS — N281 Cyst of kidney, acquired: Secondary | ICD-10-CM

## 2017-03-05 ENCOUNTER — Encounter: Payer: Self-pay | Admitting: Family Medicine

## 2017-03-06 ENCOUNTER — Telehealth: Payer: Self-pay | Admitting: Family Medicine

## 2017-03-06 ENCOUNTER — Ambulatory Visit
Admission: RE | Admit: 2017-03-06 | Discharge: 2017-03-06 | Disposition: A | Discharge status: Home / Self Care | Payer: 59 | Source: Ambulatory Visit | Attending: Sports Medicine | Admitting: Sports Medicine

## 2017-03-06 DIAGNOSIS — N281 Cyst of kidney, acquired: Secondary | ICD-10-CM

## 2017-03-06 NOTE — Telephone Encounter (Signed)
Call in Macrobid 100 mg bid for 7 days  

## 2017-03-06 NOTE — Telephone Encounter (Signed)
Sent to PCP ?

## 2017-03-06 NOTE — Telephone Encounter (Signed)
Copied from CRM [id number]. Topic: Quick Communication - See Telephone Encounter >> Mar 06, 2017  9:10 AM Waymon AmatoBurton, Donna F wrote: CRM for notification. See Telephone encounter for: pt is checking on the status of her mychart message yesterday:   Patient Email Open    03/05/2017 [facility]   Fry, Tera MaterStephen A, MD  Family Medicine  Conversation: Non-Urgent Medical Question  (Newest Message First)        03/05/17 9:56 AM  Nils FlackAdkins, Misty T, CMA routed this conversation to Nelwyn SalisburyFry, Stephen A, MD  Berlinda LastAmos, Charmika  to Nelwyn SalisburyFry, Stephen A, MD       03/05/17 9:35 AM  Dr. Clent RidgesFry - I have a UTI. Cloudy urine, pain in my back, feeling like I have to void alot, etc. Can you call me in a prescription for Macrobid, please? [facility] She needs to know if she needs to come in or will the medication macrobid be called in she doesn't want to be sick over the holiday   Best number

## 2017-03-07 ENCOUNTER — Encounter: Payer: Self-pay | Admitting: Family Medicine

## 2017-03-07 ENCOUNTER — Ambulatory Visit (INDEPENDENT_AMBULATORY_CARE_PROVIDER_SITE_OTHER): Payer: 59 | Admitting: Family Medicine

## 2017-03-07 ENCOUNTER — Other Ambulatory Visit: Payer: 59

## 2017-03-07 ENCOUNTER — Inpatient Hospital Stay
Admission: RE | Admit: 2017-03-07 | Discharge: 2017-03-07 | Disposition: A | Discharge status: Home / Self Care | Payer: 59 | Source: Ambulatory Visit | Attending: Sports Medicine | Admitting: Sports Medicine

## 2017-03-07 VITALS — BP 102/60 | Temp 98.6°F

## 2017-03-07 DIAGNOSIS — N281 Cyst of kidney, acquired: Secondary | ICD-10-CM | POA: Diagnosis not present

## 2017-03-07 DIAGNOSIS — M545 Low back pain, unspecified: Secondary | ICD-10-CM

## 2017-03-07 DIAGNOSIS — R350 Frequency of micturition: Secondary | ICD-10-CM

## 2017-03-07 LAB — POCT URINALYSIS DIPSTICK
BILIRUBIN UA: NEGATIVE
Blood, UA: NEGATIVE
Glucose, UA: NEGATIVE
Ketones, UA: NEGATIVE
Leukocytes, UA: NEGATIVE
NITRITE UA: NEGATIVE
PROTEIN UA: NEGATIVE
SPEC GRAV UA: 1.025 (ref 1.010–1.025)
UROBILINOGEN UA: 1 U/dL
pH, UA: 6 (ref 5.0–8.0)

## 2017-03-07 MED ORDER — NITROFURANTOIN MONOHYD MACRO 100 MG PO CAPS: 100.0000 mg | ORAL_CAPSULE | Freq: Two times a day (BID) | ORAL | 0 refills | Status: DC

## 2017-03-07 NOTE — Progress Notes (Signed)
   Subjective:    Patient ID: Lauren Espinoza, female    DOB: [birth year], 46 y.o.   MRN: [id number]  HPI Here for several issues. First she has had increased frequency of urinations the past week. No urgency or burning. Also she asks me to review some imaging she has had in the past 2 weeks. She has been seeing [facility] Albertha [facility]becca [facility] for [facility], and she had an MRI of the lumbar spine a week ago which showed a 5 cm cyst in the right kidney. This also showed a herniated disc which has been causing pain. They plan to give her epidural steroid injections soon for this. They then ordered a renal US on 03-06-17 which showed a 5 cm simple cyst in the right kidney. No evidence of hydronephrosis.   Review of Systems  Constitutional: Negative.   Respiratory: Negative.   Cardiovascular: Negative.   Genitourinary: Positive for frequency. Negative for dysuria, flank pain, hematuria and urgency.  Musculoskeletal: Positive for back pain.       Objective:   Physical Exam  Constitutional: She is oriented to person, place, and time. She appears well-developed and well-nourished.  Cardiovascular: Normal rate, regular rhythm, normal heart sounds and intact distal pulses.  Pulmonary/Chest: Effort normal and breath sounds normal. No respiratory distress. She has no wheezes. She has no rales.  Abdominal: Soft. Bowel sounds are normal. She exhibits no distension and no mass. There is no tenderness. There is no rebound and no guarding.  Neurological: She is alert and oriented to person, place, and time.          Assessment & Plan:  Her UA today is clear so there is no evidence of a UTI. We will culture the sample to be sure. She will follow up with orthopedics about the back pain. She has a benign renal cyst and I reassured her that this would not harm her or require treatment in all likelihood. However we will plan on getting a repeat US in 6 months to assure stability.  Gershon CraneStephen Fry, MD

## 2017-03-07 NOTE — Telephone Encounter (Signed)
Medication filled to pharmacy as requested.   

## 2017-03-07 NOTE — Telephone Encounter (Signed)
Called pt she stated that she has an apt this afternoon so she will just wait until we test her urine to see what would be best to send in for her. Rx was NOT sent in. PCP is aware pt has an apt this afternoon.

## 2017-03-08 LAB — URINE CULTURE
MICRO NUMBER:: 81428211
Result:: NO GROWTH
SPECIMEN QUALITY:: ADEQUATE

## 2017-03-16 DIAGNOSIS — M5106 Intervertebral disc disorders with myelopathy, lumbar region: Secondary | ICD-10-CM | POA: Diagnosis not present

## 2017-03-16 DIAGNOSIS — M47816 Spondylosis without myelopathy or radiculopathy, lumbar region: Secondary | ICD-10-CM | POA: Diagnosis not present

## 2017-03-17 NOTE — Telephone Encounter (Signed)
No Answer/Busy

## 2017-04-04 DIAGNOSIS — M5106 Intervertebral disc disorders with myelopathy, lumbar region: Secondary | ICD-10-CM | POA: Diagnosis not present

## 2017-04-04 DIAGNOSIS — M47816 Spondylosis without myelopathy or radiculopathy, lumbar region: Secondary | ICD-10-CM | POA: Diagnosis not present

## 2017-04-04 MED FILL — ATORVASTATIN 40 MG TABLET: 40 | 90 days supply | Qty: 90 | Fill #1

## 2017-04-04 MED FILL — metFORMIN HCL 500 MG TABS: 500 | 90 days supply | Qty: 180 | Fill #2

## 2017-04-04 MED FILL — HYDROCHLOROTHIAZIDE 25 MG T: 25 | 90 days supply | Qty: 90 | Fill #1

## 2017-05-17 MED FILL — tiZANidine HCL 4 MG TABS: 4 | 30 days supply | Qty: 30 | Fill #1

## 2017-05-17 MED FILL — LISINOPRIL 10 MG TABS: 10 | 90 days supply | Qty: 90 | Fill #2

## 2017-06-04 MED FILL — PHENobarbital 97.2 MG TABS: 97.2 | 90 days supply | Qty: 180 | Fill #1

## 2017-06-18 ENCOUNTER — Ambulatory Visit: Payer: 59 | Admitting: Family Medicine

## 2017-06-18 ENCOUNTER — Encounter: Payer: Self-pay | Admitting: Family Medicine

## 2017-06-18 VITALS — BP 110/68 | HR 94 | Temp 98.3°F | Ht 70.0 in

## 2017-06-18 DIAGNOSIS — J069 Acute upper respiratory infection, unspecified: Secondary | ICD-10-CM

## 2017-06-18 DIAGNOSIS — M109 Gout, unspecified: Secondary | ICD-10-CM | POA: Diagnosis not present

## 2017-06-18 MED ORDER — METHYLPREDNISOLONE 4 MG PO TBPK: ORAL_TABLET | ORAL | 0 refills | Status: DC

## 2017-06-18 NOTE — Progress Notes (Signed)
   Subjective:    Patient ID: Lauren Espinoza, female    DOB: 17-Jan-1971, 47 y.o.   MRN: [id number]  HPI Here for 2 issues. First she has had a dry cough for about a week, with PND and a scratchy throat. No fever. Also for 3 days she has had pain and swelling in both great toes. She thinks this is gout because her father and all her aunts and uncles have had gout.    Review of Systems  Constitutional: Negative.   HENT: Positive for congestion, postnasal drip and sore throat. Negative for sinus pressure and sinus pain.   Eyes: Negative.   Respiratory: Positive for cough.   Musculoskeletal: Positive for arthralgias and joint swelling.       Objective:   Physical Exam  Constitutional: She appears well-developed and well-nourished. No distress.  HENT:  Right Ear: External ear normal.  Left Ear: External ear normal.  Nose: Nose normal.  Mouth/Throat: Oropharynx is clear and moist.  Eyes: Conjunctivae are normal.  Neck: No thyromegaly present.  Pulmonary/Chest: Effort normal and breath sounds normal. No respiratory distress. She has no wheezes. She has no rales.  Musculoskeletal:  The MTP joint on both great toes is swollen and tender. No erythema or warmth.   Lymphadenopathy:    She has no cervical adenopathy.          Assessment & Plan:  This is her first episode of gout. Treat with a Medrol dose pack. Get a baseline uric acid level. She also has a viral URI, and the steroid pack should help this as well.  Gershon CraneStephen Lekisha Mcghee, MD

## 2017-06-19 LAB — URIC ACID: Uric Acid, Serum: 6.4 mg/dL (ref 2.4–7.0)

## 2017-07-02 MED FILL — HYDROCHLOROTHIAZIDE 25 MG T: 25 | 90 days supply | Qty: 90 | Fill #2

## 2017-07-02 MED FILL — ATORVASTATIN 40 MG TABLET: 40 | 90 days supply | Qty: 90 | Fill #2

## 2017-07-16 ENCOUNTER — Ambulatory Visit (INDEPENDENT_AMBULATORY_CARE_PROVIDER_SITE_OTHER): Payer: 59 | Admitting: Family Medicine

## 2017-07-16 ENCOUNTER — Encounter: Payer: Self-pay | Admitting: Family Medicine

## 2017-07-16 DIAGNOSIS — Z6841 Body Mass Index (BMI) 40.0 and over, adult: Secondary | ICD-10-CM

## 2017-07-16 DIAGNOSIS — E119 Type 2 diabetes mellitus without complications: Secondary | ICD-10-CM

## 2017-07-16 MED FILL — metFORMIN HCL 500 MG TABS: 500 | 90 days supply | Qty: 180 | Fill #3

## 2017-07-16 NOTE — Patient Instructions (Addendum)
-   In July when you see your PCP, ask to have a vitamin level checked.    - Make a list of 7-10 dinner meals that taste good, are relatively quick and easy to prepare, and that meet your nutritional needs.  Use this as a basis for shopping, so you can make one of these meals any time.  Bring your list to your follow-up appointment for review.    - The best carb's are those with fiber; fiber is found only in plant foods, and mostly in skin, seeds, and stems.  A good website that provides a lot of information on lifestyle for diabetes (including glycemic index and glycemic load of foods): BodyPens.ca.    - Bring to follow-up appt calories, protein, carb, fiber, and fat of the Lite and High-fiber Eng muffins.    - Check out the website Self-compassion.org; take the quiz, and bring your scores (overall and 6 sub-scores) to follow-up appt.    - Specific Goals: 1. Obtain twice the volume of vegetables as either protein or starchy foods for both lunch and dinner.  - If having an entree salad for a meal, limit the dressing to being on the side, dipping fork before spearing lettuce.   2. Physical activity goal: Walk at least 30 min 5 days a week, starting with 15-20 min the first week and 20-25 min week 2, and 25-30 week 3.    - Explore personal trainer options.    - Track progress using Goals Sheet, and bring to follow-up appt.

## 2017-07-16 NOTE — Progress Notes (Signed)
Medical Nutrition Therapy:  Appt start time: 1530 end time:  1630. PCP Lauren Crane, MD Husband Lauren Espinoza  Assessment:  Primary concerns today: Weight management.  Lauren Espinoza would like to lose weight.  She is looking for help in understanding the behaviors she needs to target, as well as accountability.  Her BP medication was recently reduced.  Her A1C was 5.8 in July 2018.    Weight history:  Much of weight gain has been since 7-YO daughter was born.  Lauren Espinoza said she has been overweight since childhood.  Lost down to 220 in college by walking 3 miles 5 days a week and consuming SlimFast for 2 meals a day, followed by dinner of lean protein and veg's.    Learning Readiness: Ready  Usual eating pattern includes 2-3 meals and 0-1 snack per day. Frequent foods and beverages include water, Crystal Light, unswt tea, 2-3 oz regular Coke/day; potatoes, chx, salads, bread, boiled egg.  Usually goes to lunch with co-workers M-F.  Avoided foods include most pasta (likes it, but worries it is too high-glycemic).   Usual physical activity includes none currently.  Lauren Espinoza wants to start getting consistent with exercise.  Currently works 40 hrs a week; 7-YO daughter is in school, with after-school care by grandparents, who would be willing to keep her long enough for Lauren Espinoza to work out.     24-hr recall: (Up at 8 AM) B (8:30 AM)-   2 scrmld eggs (in spray oil) & onions, 2-3 oz regular Coke Snk ( AM)-   water L (11:30 PM)-  3 oz Kielbasa sausage, mustard, water Snk (3 PM)-  1/2 c reduced-fat Wht Thins, water D (6:30 PM)-  4-6 oz grilled fish, roasted potatoes w/ olive oil, salad, 2 tbsp ranch drsng, unswt tea  Snk ( PM)-  1 c grilled pineapple Typical day? No. More typical would be bkfst of Eng muffin w/ egg, 1 pc Canadian bacon; Lunch of chef salad w/ 1/2 c drsng; dinner of chx, veg +/- starch.  Common snacks are crackers or fruit  Progress Towards Goal(s):  In progress.   Nutritional Diagnosis:  NB-2.1 Physical  inactivity As related to time constraints and poor motivation.  As evidenced by no regular physical activity.    Intervention:  Nutrition education Handouts given during visit include:   After-Visit Summary (AVS)  Meal Planning form  Demonstrated degree of understanding via:  Teach Back  Barriers to learning/adherence to lifestyle change: I suspect Lauren Espinoza is very hard on herself for not managing to lose weight.  I anticipate talking with her about managing negative thoughts as well as about self-compassion, which has been shown to help in positive behavior change.   Monitoring/Evaluation:  Dietary intake, exercise, and body weight in 4 week(s).

## 2017-07-19 ENCOUNTER — Ambulatory Visit: Payer: 59 | Admitting: Family Medicine

## 2017-07-23 DIAGNOSIS — H5203 Hypermetropia, bilateral: Secondary | ICD-10-CM | POA: Diagnosis not present

## 2017-07-23 DIAGNOSIS — H52223 Regular astigmatism, bilateral: Secondary | ICD-10-CM | POA: Diagnosis not present

## 2017-07-23 DIAGNOSIS — E119 Type 2 diabetes mellitus without complications: Secondary | ICD-10-CM | POA: Diagnosis not present

## 2017-08-03 ENCOUNTER — Encounter: Payer: Self-pay | Admitting: Family Medicine

## 2017-08-03 ENCOUNTER — Ambulatory Visit (INDEPENDENT_AMBULATORY_CARE_PROVIDER_SITE_OTHER): Payer: Self-pay | Admitting: Family Medicine

## 2017-08-03 VITALS — BP 102/72 | HR 107 | Temp 98.6°F | Wt 280.0 lb

## 2017-08-03 DIAGNOSIS — R059 Cough, unspecified: Secondary | ICD-10-CM

## 2017-08-03 DIAGNOSIS — R05 Cough: Secondary | ICD-10-CM

## 2017-08-03 DIAGNOSIS — R062 Wheezing: Secondary | ICD-10-CM

## 2017-08-03 DIAGNOSIS — R0981 Nasal congestion: Secondary | ICD-10-CM

## 2017-08-03 DIAGNOSIS — J329 Chronic sinusitis, unspecified: Secondary | ICD-10-CM

## 2017-08-03 MED ORDER — AMOXICILLIN-POT CLAVULANATE 875-125 MG PO TABS: 1.0000 | ORAL_TABLET | Freq: Two times a day (BID) | ORAL | 0 refills | Status: DC

## 2017-08-03 MED ORDER — PREDNISONE 20 MG PO TABS: 40.0000 mg | ORAL_TABLET | Freq: Every day | ORAL | 0 refills | Status: AC

## 2017-08-03 MED ORDER — PSEUDOEPH-BROMPHEN-DM 30-2-10 MG/5ML PO SYRP: 10.0000 mL | ORAL_SOLUTION | Freq: Four times a day (QID) | ORAL | 0 refills | Status: DC | PRN

## 2017-08-03 MED ORDER — IPRATROPIUM BROMIDE 0.06 % NA SOLN: 2.0000 | Freq: Three times a day (TID) | NASAL | 0 refills | Status: DC

## 2017-08-03 NOTE — Progress Notes (Signed)
Lauren Espinoza is a 47 y.o. female who presents today with concerns of sinus pressure and pain that began approx 10 days ago she has completed an evist where augmentin was recommended but patient reports success with historical azithromycin use and provider prescribed a z-pack in discussion with patient who arrived today and reports symptoms have not improved despite antibiotic use and OTC mucinex with decongestant use. She has co-morbid DM 2 and HTN per chart review. She is other wise alert and well appearing.  Review of Systems  Constitutional: Positive for fever and malaise/fatigue. Negative for chills.  HENT: Positive for congestion and sinus pain. Negative for ear discharge, ear pain and sore throat.   Eyes: Negative.   Respiratory: Positive for cough and sputum production. Negative for shortness of breath.   Cardiovascular: Negative.  Negative for chest pain.  Gastrointestinal: Negative for abdominal pain, diarrhea, nausea and vomiting.  Genitourinary: Negative for dysuria, frequency, hematuria and urgency.  Musculoskeletal: Negative for myalgias.  Skin: Negative.   Neurological: Negative for headaches.  Endo/Heme/Allergies: Negative.   Psychiatric/Behavioral: Negative.     O: Vitals:   08/03/17 0958  BP: 102/72  Pulse: (!) 107  Temp: 98.6 F (37 C)  SpO2: 96%     Physical Exam  Constitutional: She is oriented to person, place, and time. Vital signs are normal. She appears well-developed and well-nourished. She is active.  Non-toxic appearance. She does not have a sickly appearance.  HENT:  Head: Normocephalic.  Right Ear: Hearing, tympanic membrane, external ear and ear canal normal.  Left Ear: Hearing, tympanic membrane, external ear and ear canal normal.  Nose: Rhinorrhea present. Right sinus exhibits maxillary sinus tenderness and frontal sinus tenderness. Left sinus exhibits maxillary sinus tenderness and frontal sinus tenderness.  Mouth/Throat: Uvula is midline and  oropharynx is clear and moist.  Neck: Normal range of motion. Neck supple.  Cardiovascular: Normal rate, regular rhythm, normal heart sounds and normal pulses.  Pulmonary/Chest: Effort normal. She has wheezes in the right upper field and the left upper field. She has rhonchi in the right lower field and the left lower field.  Abdominal: Soft. Bowel sounds are normal.  Musculoskeletal: Normal range of motion.  Lymphadenopathy:       Head (right side): Submental and submandibular adenopathy present.       Head (left side): Submental and submandibular adenopathy present.    She has cervical adenopathy.  Neurological: She is alert and oriented to person, place, and time.  Skin: Skin is warm.  Psychiatric: She has a normal mood and affect.  Vitals reviewed.    A: 1. Sinusitis, unspecified chronicity, unspecified location   2. Nasal congestion   3. Cough   4. Wheezing      P: Discussed potential for resistant bacteria causing symptoms and discussed that augmentin is drug of choice for this condition. Briefly discussed abx overuse and resistance. Review supportive medications prescribed for associated symptoms supportive care and management. Exam findings, diagnosis etiology and medication use and indications reviewed with patient. Follow- Up and discharge instructions provided. No emergent/urgent issues found on exam.  Patient verbalized understanding of information provided and agrees with plan of care (POC), all questions answered.  1. Sinusitis, unspecified chronicity, unspecified location - amoxicillin-clavulanate (AUGMENTIN) 875-125 MG tablet; Take 1 tablet by mouth 2 (two) times daily.  2. Nasal congestion - ipratropium (ATROVENT) 0.06 % nasal spray; Place 2 sprays into both nostrils 3 (three) times daily. - brompheniramine-pseudoephedrine-DM 30-2-10 MG/5ML syrup; Take 10 mLs by mouth 4 (  four) times daily as needed.  3. Cough - brompheniramine-pseudoephedrine-DM 30-2-10 MG/5ML  syrup; Take 10 mLs by mouth 4 (four) times daily as needed.  4. Wheezing - predniSONE (DELTASONE) 20 MG tablet; Take 2 tablets (40 mg total) by mouth daily with breakfast for 5 days.

## 2017-08-03 NOTE — Patient Instructions (Signed)

## 2017-08-14 ENCOUNTER — Ambulatory Visit: Payer: 59 | Admitting: Family Medicine

## 2017-08-15 MED FILL — LISINOPRIL 10 MG TABLET: 10 | 90 days supply | Qty: 90 | Fill #3

## 2017-08-16 ENCOUNTER — Encounter: Payer: Self-pay | Admitting: Family Medicine

## 2017-08-16 ENCOUNTER — Ambulatory Visit (INDEPENDENT_AMBULATORY_CARE_PROVIDER_SITE_OTHER): Payer: 59 | Admitting: Family Medicine

## 2017-08-16 DIAGNOSIS — Z6841 Body Mass Index (BMI) 40.0 and over, adult: Secondary | ICD-10-CM

## 2017-08-16 DIAGNOSIS — E119 Type 2 diabetes mellitus without complications: Secondary | ICD-10-CM

## 2017-08-16 NOTE — Patient Instructions (Addendum)
-   Email your self-compassion scores.   - Read some of the articles and watch at least a couple videos from Self-compassion.org.  Write a summary of what you have learned, and be specific as to how it applies to you.  Email this to me or bring to next appt.   - At next appt, we'll review the self-compassion stuff, and go over ways to deal with some of the negativity.    - Specific Goals: 1. Obtain twice the volume of vegetables as either protein or starchy foods for both lunch and dinner. 2. Physical activity goal: Walk at least 30 min 5 days a week.   - Complete your Goals Sheet, and bring to follow-up.  Instead of a check mark, write the number of minutes of walking on your sheet.  In the right margin of your Goals Sheet, tally the week's number of minutes walking.

## 2017-08-16 NOTE — Progress Notes (Signed)
Medical Nutrition Therapy:  Appt start time: 1530 end time:  1630. PCP Lauren Crane, MD Husband Lauren Espinoza  Assessment:  Primary concerns today: Weight management.  Lauren Espinoza was sick with a URI and severe cough for a couple weeks, which impacted her ability to exercise.  She has greatly increased vegetable intake, and is eating a lot of grilled fish and chicken.    Lauren Espinoza completed her Goals Sheet, and filled in her Meal Planning form.  She also took the Self-compassion quiz we discussed at last appt. Her score was 1.5 out of 5.  We talked today about why poor self-compassion gets in the way of reaching health behavior goals.    Learning Readiness: Ready  Recent usual eating pattern includes 3 meals and usually one snack. Frequent foods and beverages include water, Crystal Light, unswt tea, 2-3 oz regular Coke/day; potatoes, chx, salads, bread, boiled egg.  Usually goes to lunch with co-workers M-F.  Avoided foods include most pasta (likes it, but worries it is too high-glycemic).   Recent physical activity includes yard work, getting more steps at work (e.g., walk around the building).  Just started feeling ok this week; hopes to get back to actual walking.    24-hr recall:  (Up at 6 AM) B (8:30 AM)-  Water, 1 lite Eng muffin (100 kcal), 1 slc ham,  Snk (9:30)-  3-4 oz Coke L (12:30 PM)-  1 large grilled chx salad, water Snk ( PM)-  --- D ( PM)-  1 lean hambrgr patty, 2 c grilled Br sprouts, 1 corn on cob, water Snk ( PM)-  6-7 baby carrots & yogurt dip Typical day? Yes.    Progress Towards Goal(s):  In progress.   Nutritional Diagnosis:  Only slight progress on NB-2.1 Physical inactivity As related to time constraints and poor motivation.  As evidenced by no regular physical activity.    Intervention:  Nutrition education Handouts given during visit include:   After-Visit Summary (AVS)  Demonstrated degree of understanding via:  Teach Back  Barriers to learning/adherence to lifestyle change:  I suspect Lauren Espinoza is very hard on herself for not managing to lose weight.  I anticipate talking with her about managing negative thoughts as well as about self-compassion, which has been shown to help in positive behavior change.   Monitoring/Evaluation:  Dietary intake, exercise, and body weight in 4 week(s).

## 2017-08-21 ENCOUNTER — Ambulatory Visit: Payer: 59 | Admitting: Family Medicine

## 2017-08-23 ENCOUNTER — Encounter: Payer: Self-pay | Admitting: Family Medicine

## 2017-08-23 DIAGNOSIS — N281 Cyst of kidney, acquired: Secondary | ICD-10-CM

## 2017-08-23 NOTE — Telephone Encounter (Signed)
The renal US was ordered

## 2017-08-23 NOTE — Telephone Encounter (Addendum)
Pt notified via mychart - made aware that Renal US was ordered and she will be hearing from someone regarding an appt.

## 2017-09-03 ENCOUNTER — Other Ambulatory Visit: Payer: Self-pay | Admitting: Family Medicine

## 2017-09-03 DIAGNOSIS — B078 Other viral warts: Secondary | ICD-10-CM | POA: Diagnosis not present

## 2017-09-03 NOTE — Telephone Encounter (Signed)
Last OV 06/18/2017   Last refilled 02/22/2017 disp 180 with 1 refill   Sent to PCP to advise

## 2017-09-04 ENCOUNTER — Encounter: Payer: Self-pay | Admitting: Family Medicine

## 2017-09-04 ENCOUNTER — Ambulatory Visit (INDEPENDENT_AMBULATORY_CARE_PROVIDER_SITE_OTHER): Payer: 59 | Admitting: Family Medicine

## 2017-09-04 DIAGNOSIS — Z6841 Body Mass Index (BMI) 40.0 and over, adult: Secondary | ICD-10-CM

## 2017-09-04 DIAGNOSIS — E119 Type 2 diabetes mellitus without complications: Secondary | ICD-10-CM | POA: Diagnosis not present

## 2017-09-04 MED FILL — PHENobarbital 97.2 MG TABS: 97.2 | 90 days supply | Qty: 180 | Fill #0

## 2017-09-04 NOTE — Patient Instructions (Addendum)
In times of feeling self-critical: - Remind myself I'm not alone in experiencing this. - Allow myself some grace.   - When possible, talk to someone supportive.    Other ways to improve your level of self-compassion: - Don't be afraid to fail.  Let go of the need for perfection.    - Focus on BEHAVIOR rather than only outcome all the time.   - Go back to [employer].org site, and watch Dr. Tedd SiasNeff's TED talk, and read more on the site.    - Email Jeannie at least 3 tangible ways you can improve your self-compassion.    Check out the TED Talk: Amy Cuddy.

## 2017-09-04 NOTE — Telephone Encounter (Signed)
Call in #180 with one rf  

## 2017-09-04 NOTE — Telephone Encounter (Signed)
Prescription phoned to the pharmacy.
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
## 2017-09-07 ENCOUNTER — Ambulatory Visit
Admission: RE | Admit: 2017-09-07 | Discharge: 2017-09-07 | Disposition: A | Discharge status: Home / Self Care | Payer: 59 | Source: Ambulatory Visit | Attending: Family Medicine | Admitting: Family Medicine

## 2017-09-07 DIAGNOSIS — N281 Cyst of kidney, acquired: Secondary | ICD-10-CM

## 2017-09-20 ENCOUNTER — Encounter: Payer: Self-pay | Admitting: Family Medicine

## 2017-09-24 ENCOUNTER — Encounter: Payer: Self-pay | Admitting: Family Medicine

## 2017-09-24 ENCOUNTER — Ambulatory Visit: Payer: 59 | Admitting: Family Medicine

## 2017-09-25 NOTE — Telephone Encounter (Signed)
I would need to see her since I have not been involved with this issue whatsoever

## 2017-09-27 MED FILL — ATORVASTATIN 40 MG TABLET: 40 | 90 days supply | Qty: 90 | Fill #3

## 2017-09-27 MED FILL — HYDROCHLOROTHIAZIDE 25 MG T: 25 | 90 days supply | Qty: 90 | Fill #3

## 2017-10-07 ENCOUNTER — Encounter: Payer: Self-pay | Admitting: Family Medicine

## 2017-10-08 ENCOUNTER — Encounter: Payer: 59 | Admitting: Family Medicine

## 2017-10-09 ENCOUNTER — Ambulatory Visit: Payer: 59 | Admitting: Family Medicine

## 2017-10-11 ENCOUNTER — Encounter: Payer: Self-pay | Admitting: Family Medicine

## 2017-10-11 ENCOUNTER — Ambulatory Visit (INDEPENDENT_AMBULATORY_CARE_PROVIDER_SITE_OTHER): Payer: 59 | Admitting: Family Medicine

## 2017-10-11 VITALS — BP 108/71 | HR 86 | Temp 98.5°F | Ht 70.0 in

## 2017-10-11 DIAGNOSIS — G8929 Other chronic pain: Secondary | ICD-10-CM

## 2017-10-11 DIAGNOSIS — Z0000 Encounter for general adult medical examination without abnormal findings: Secondary | ICD-10-CM

## 2017-10-11 DIAGNOSIS — E119 Type 2 diabetes mellitus without complications: Secondary | ICD-10-CM

## 2017-10-11 DIAGNOSIS — M545 Low back pain, unspecified: Secondary | ICD-10-CM

## 2017-10-11 LAB — CBC WITH DIFFERENTIAL/PLATELET
Basophils Absolute: 0.1 10*3/uL (ref 0.0–0.1)
Basophils Relative: 1 % (ref 0.0–3.0)
Eosinophils Absolute: 0.2 10*3/uL (ref 0.0–0.7)
Eosinophils Relative: 2.2 % (ref 0.0–5.0)
HEMATOCRIT: 39.9 % (ref 36.0–46.0)
HEMOGLOBIN: 13.8 g/dL (ref 12.0–15.0)
Lymphocytes Relative: 26 % (ref 12.0–46.0)
Lymphs Abs: 1.9 10*3/uL (ref 0.7–4.0)
MCHC: 34.7 g/dL (ref 30.0–36.0)
MCV: 89.9 fl (ref 78.0–100.0)
Monocytes Absolute: 0.6 10*3/uL (ref 0.1–1.0)
Monocytes Relative: 7.8 % (ref 3.0–12.0)
Neutro Abs: 4.5 10*3/uL (ref 1.4–7.7)
Neutrophils Relative %: 63 % (ref 43.0–77.0)
Platelets: 327 10*3/uL (ref 150.0–400.0)
RBC: 4.44 Mil/uL (ref 3.87–5.11)
RDW: 13 % (ref 11.5–15.5)
WBC: 7.2 10*3/uL (ref 4.0–10.5)

## 2017-10-11 LAB — POC URINALSYSI DIPSTICK (AUTOMATED)
Bilirubin, UA: NEGATIVE
Blood, UA: NEGATIVE
GLUCOSE UA: NEGATIVE
Ketones, UA: NEGATIVE
Leukocytes, UA: NEGATIVE
NITRITE UA: NEGATIVE
Protein, UA: NEGATIVE
Spec Grav, UA: 1.015 (ref 1.010–1.025)
UROBILINOGEN UA: 0.2 U/dL
pH, UA: 6.5 (ref 5.0–8.0)

## 2017-10-11 LAB — HEPATIC FUNCTION PANEL
ALT: 16 U/L (ref 0–35)
AST: 11 U/L (ref 0–37)
Albumin: 4.3 g/dL (ref 3.5–5.2)
Alkaline Phosphatase: 39 U/L (ref 39–117)
Bilirubin, Direct: 0.1 mg/dL (ref 0.0–0.3)
Total Bilirubin: 0.4 mg/dL (ref 0.2–1.2)
Total Protein: 6.8 g/dL (ref 6.0–8.3)

## 2017-10-11 LAB — BASIC METABOLIC PANEL WITH GFR
BUN: 11 mg/dL (ref 6–23)
CO2: 27 meq/L (ref 19–32)
Calcium: 9.4 mg/dL (ref 8.4–10.5)
Chloride: 101 meq/L (ref 96–112)
Creatinine, Ser: 0.63 mg/dL (ref 0.40–1.20)
GFR: 108.81 mL/min
Glucose, Bld: 117 mg/dL — ABNORMAL HIGH (ref 70–99)
Potassium: 4.2 meq/L (ref 3.5–5.1)
Sodium: 139 meq/L (ref 135–145)

## 2017-10-11 LAB — LIPID PANEL
Cholesterol: 164 mg/dL (ref 0–200)
HDL: 44.9 mg/dL (ref >39.00)
LDL CALC: 85 mg/dL (ref 0–99)
NonHDL: 118.6
Total CHOL/HDL Ratio: 4
Triglycerides: 166 mg/dL — ABNORMAL HIGH (ref 0.0–149.0)
VLDL: 33.2 mg/dL (ref 0.0–40.0)

## 2017-10-11 LAB — TSH: TSH: 2.95 u[IU]/mL (ref 0.35–4.50)

## 2017-10-11 LAB — HEMOGLOBIN A1C: Hgb A1c MFr Bld: 5.9 % (ref 4.6–6.5)

## 2017-10-11 MED ORDER — FUROSEMIDE 20 MG PO TABS: ORAL_TABLET | ORAL | 3 refills | Status: DC

## 2017-10-11 MED ORDER — METFORMIN HCL 500 MG PO TABS: ORAL_TABLET | ORAL | 3 refills | Status: DC

## 2017-10-11 MED ORDER — LISINOPRIL 10 MG PO TABS: 10.0000 mg | ORAL_TABLET | Freq: Every day | ORAL | 3 refills | Status: DC

## 2017-10-11 MED ORDER — HYDROCHLOROTHIAZIDE 25 MG PO TABS: 25.0000 mg | ORAL_TABLET | Freq: Every day | ORAL | 3 refills | Status: DC

## 2017-10-11 MED ORDER — ATORVASTATIN CALCIUM 40 MG PO TABS: 40.0000 mg | ORAL_TABLET | Freq: Every day | ORAL | 3 refills | Status: DC

## 2017-10-11 MED FILL — metFORMIN HCL 500 MG TABS: 500 | 90 days supply | Qty: 180 | Fill #0

## 2017-10-11 MED FILL — FUROSEMIDE 20 MG TABS: 20 | 90 days supply | Qty: 90 | Fill #0

## 2017-10-11 NOTE — Progress Notes (Signed)
   Subjective:    Patient ID: Lauren Espinoza, female    DOB: 05-27-1970, 47 y.o.   MRN: [id number]  HPI Here for a well exam. She feels well except for a few issues. She asks to get a refill pn Lasix to use as needed for ankle swelling. Also her lower back still causes her pain almost daily. She takes Ibuprofen prn. She has seen [facility] about this and they recommended epidural steroid shots, but she would like to get a second opinion from Neurosurgery.    Review of Systems  Constitutional: Negative.   HENT: Negative.   Eyes: Negative.   Respiratory: Negative.   Cardiovascular: Positive for leg swelling. Negative for chest pain and palpitations.  Gastrointestinal: Negative.   Genitourinary: Negative for decreased urine volume, difficulty urinating, dyspareunia, dysuria, enuresis, flank pain, frequency, hematuria, pelvic pain and urgency.  Musculoskeletal: Positive for back pain.  Skin: Negative.   Neurological: Negative.   Psychiatric/Behavioral: Negative.        Objective:   Physical Exam  Constitutional: She is oriented to person, place, and time. She appears well-developed and well-nourished. No distress.  HENT:  Head: Normocephalic and atraumatic.  Right Ear: External ear normal.  Left Ear: External ear normal.  Nose: Nose normal.  Mouth/Throat: Oropharynx is clear and moist. No oropharyngeal exudate.  Eyes: Pupils are equal, round, and reactive to light. Conjunctivae and EOM are normal. No scleral icterus.  Neck: Normal range of motion. Neck supple. No JVD present. No thyromegaly present.  Cardiovascular: Normal rate, regular rhythm, normal heart sounds and intact distal pulses. Exam reveals no gallop and no friction rub.  No murmur heard. Pulmonary/Chest: Effort normal and breath sounds normal. No respiratory distress. She has no wheezes. She has no rales. She exhibits no tenderness.  Abdominal: Soft. Bowel sounds are normal. She exhibits no distension and no mass. There is  no tenderness. There is no rebound and no guarding.  Musculoskeletal: Normal range of motion. She exhibits no edema or tenderness.  Lymphadenopathy:    She has no cervical adenopathy.  Neurological: She is alert and oriented to person, place, and time. She has normal reflexes. She displays normal reflexes. No cranial nerve deficit. She exhibits normal muscle tone. Coordination normal.  Skin: Skin is warm and dry. No rash noted. No erythema.  Psychiatric: She has a normal mood and affect. Her behavior is normal. Judgment and thought content normal.          Assessment & Plan:  Well exam. We discussed diet and exercise. Get fasting labs. Refer to Neurosurgery. Gershon CraneStephen Talicia Sui, MD

## 2017-11-08 DIAGNOSIS — I10 Essential (primary) hypertension: Secondary | ICD-10-CM | POA: Diagnosis not present

## 2017-11-08 DIAGNOSIS — Z6841 Body Mass Index (BMI) 40.0 and over, adult: Secondary | ICD-10-CM | POA: Diagnosis not present

## 2017-11-08 DIAGNOSIS — M47816 Spondylosis without myelopathy or radiculopathy, lumbar region: Secondary | ICD-10-CM | POA: Diagnosis not present

## 2017-11-09 MED FILL — LISINOPRIL 10 MG TABLET: 10 | 90 days supply | Qty: 90 | Fill #0

## 2017-11-30 ENCOUNTER — Encounter: Payer: Self-pay | Admitting: Adult Health

## 2017-11-30 ENCOUNTER — Ambulatory Visit: Payer: 59 | Admitting: Adult Health

## 2017-11-30 VITALS — BP 100/70 | Temp 98.3°F

## 2017-11-30 DIAGNOSIS — M545 Low back pain, unspecified: Secondary | ICD-10-CM

## 2017-11-30 DIAGNOSIS — Z23 Encounter for immunization: Secondary | ICD-10-CM | POA: Diagnosis not present

## 2017-11-30 DIAGNOSIS — M542 Cervicalgia: Secondary | ICD-10-CM | POA: Diagnosis not present

## 2017-11-30 DIAGNOSIS — M79602 Pain in left arm: Secondary | ICD-10-CM

## 2017-11-30 LAB — POCT URINALYSIS DIPSTICK
Bilirubin, UA: NEGATIVE
Blood, UA: NEGATIVE
GLUCOSE UA: NEGATIVE
Ketones, UA: NEGATIVE
Leukocytes, UA: NEGATIVE
NITRITE UA: NEGATIVE
PROTEIN UA: NEGATIVE
SPEC GRAV UA: 1.02 (ref 1.010–1.025)
Urobilinogen, UA: 0.2 E.U./dL
pH, UA: 6 (ref 5.0–8.0)

## 2017-11-30 NOTE — Progress Notes (Signed)
Subjective:    Patient ID: Lauren Espinoza, female    DOB: 10-Mar-1971, 47 y.o.   MRN: [id number]  HPI  47 year old female who  has a past medical history of BACK PAIN, UPPER (09/03/2008), Diabetes mellitus, DVT, HX OF (04/20/2008), Edema, Hyperlipidemia, HYPERTENSION (04/20/2008), Left shoulder pain, and SEIZURE DISORDER (04/20/2008).  She is a patient of Dr. Clent RidgesFry who I am seeing today for an acute issue of left sided pain. She reports that her symptoms started about one week ago.  She reports intermittent but often  " headache pain" that starts on the left side of the back of her head and travels down her left arm. She denies any numbness or tingling sensation, loss of grip strength, blurred vision, shortness of breath or chest pain. She reports taking a muscle relaxer she had at home and this resolved the pain for a short period of time.   Additionally, 2-3 days ago she developed left lower back pain. This pain is felt as a " muscle strain". Pain is worse when taking a deep breath and bending and twisting. She denies any UTI like symptoms.   Review of Systems See HPI   Past Medical History:  Diagnosis Date  . BACK PAIN, UPPER 09/03/2008  . Diabetes mellitus   . DVT, HX OF 04/20/2008  . Edema   . Hyperlipidemia   . HYPERTENSION 04/20/2008  . Left shoulder pain    sees Dr Chaney MallingMortenson  . SEIZURE DISORDER 04/20/2008   last seizure  1995    Social History   Socioeconomic History  . Marital status: Married    Spouse name: Not on file  . Number of children: Not on file  . Years of education: Not on file  . Highest education level: Not on file  Occupational History  . Occupation: Pension scheme manageraccounting  supervisor     Employer: [employer]  Social Needs  . Financial resource strain: Not on file  . Food insecurity:    Worry: Not on file    Inability: Not on file  . Transportation needs:    Medical: Not on file    Non-medical: Not on file  Tobacco Use  . Smoking status: Never Smoker  . Smokeless  tobacco: Never Used  Substance and Sexual Activity  . Alcohol use: No    Alcohol/week: 0.0 standard drinks  . Drug use: No  . Sexual activity: Not on file  Lifestyle  . Physical activity:    Days per week: Not on file    Minutes per session: Not on file  . Stress: Not on file  Relationships  . Social connections:    Talks on phone: Not on file    Gets together: Not on file    Attends religious service: Not on file    Active member of club or organization: Not on file    Attends meetings of clubs or organizations: Not on file    Relationship status: Not on file  . Intimate partner violence:    Fear of current or ex partner: Not on file    Emotionally abused: Not on file    Physically abused: Not on file    Forced sexual activity: Not on file  Other Topics Concern  . Not on file  Social History Narrative  . Not on file    Past Surgical History:  Procedure Laterality Date  . CESAREAN SECTION    . CHOLECYSTECTOMY    . ENDOMETRIAL ABLATION  08-09-11   per Dr.  Horvath     Family History  Problem Relation Age of Onset  . Diabetes Father   . Diabetes Maternal Uncle   . Diabetes Paternal Grandmother   . Diabetes Paternal Grandfather     No Known Allergies  Current Outpatient Medications on File Prior to Visit  Medication Sig Dispense Refill  . aspirin 325 MG tablet Take 325 mg by mouth daily.    Marland Kitchen atorvastatin (LIPITOR) 40 MG tablet Take 1 tablet (40 mg total) by mouth daily. 90 tablet 3  . diazepam (VALIUM) 5 MG tablet Take 1 tablet (5 mg total) by mouth every 12 (twelve) hours as needed for anxiety. 180 tablet 1  . furosemide (LASIX) 20 MG tablet Take one daily as needed for swelling 90 tablet 3  . hydrochlorothiazide (HYDRODIURIL) 25 MG tablet Take 1 tablet (25 mg total) by mouth daily. 90 tablet 3  . lisinopril (PRINIVIL,ZESTRIL) 10 MG tablet Take 1 tablet (10 mg total) by mouth daily. 90 tablet 3  . metFORMIN (GLUCOPHAGE) 500 MG tablet TAKE 1 TABLET BY MOUTH TWICE  DAILY WITH A MEAL 180 tablet 3  . PHENobarbital (LUMINAL) 97.2 MG tablet TAKE 1 TABLET BY MOUTH TWICE DAILY 180 tablet 1   No current facility-administered medications on file prior to visit.     BP 100/70   Temp 98.3 F (36.8 C) (Oral)       Objective:   Physical Exam  Constitutional: She is oriented to person, place, and time. She appears well-developed and well-nourished. No distress.  Cardiovascular: Normal rate, regular rhythm, normal heart sounds and intact distal pulses.  Pulmonary/Chest: Effort normal and breath sounds normal.  Musculoskeletal: Normal range of motion. She exhibits tenderness. She exhibits no edema or deformity.  Tenderness with light palpation to left trapezius, levatour scapulae, and deltoid.   She also has tenderness with deep palpation to left lower back. No spinal tenderness   Neurological: She is alert and oriented to person, place, and time.  Skin: Skin is warm and dry. She is not diaphoretic.  Nursing note and vitals reviewed.     Assessment & Plan:  Symptoms appear to be muscular in origin. POC UA was negative and EKG showed NSR, rate 74  Advised to take muscle relaxer she has at home as well as Motrin 600 mg Q6H x 3 days. Ok to use a heating pad.  - Rest  - Follow up in 2-3 days if no improvement or if symptoms worsen   Shirline Frees, NP

## 2017-12-04 MED FILL — PHENobarbital 97.2 MG TABS: 97.2 | 90 days supply | Qty: 180 | Fill #1

## 2017-12-27 MED FILL — HYDROCHLOROTHIAZIDE 25 MG T: 25 | 90 days supply | Qty: 90 | Fill #0

## 2017-12-27 MED FILL — FUROSEMIDE 20 MG TABS: 20 | 90 days supply | Qty: 90 | Fill #1

## 2017-12-27 MED FILL — metFORMIN HCL 500 MG TABS: 500 | 90 days supply | Qty: 180 | Fill #1

## 2018-01-01 DIAGNOSIS — Z1231 Encounter for screening mammogram for malignant neoplasm of breast: Secondary | ICD-10-CM | POA: Diagnosis not present

## 2018-01-01 DIAGNOSIS — Z01419 Encounter for gynecological examination (general) (routine) without abnormal findings: Secondary | ICD-10-CM | POA: Diagnosis not present

## 2018-02-07 MED FILL — ATORVASTATIN 40 MG TABLET: 40 | 90 days supply | Qty: 90 | Fill #0

## 2018-02-07 MED FILL — LISINOPRIL 10 MG TABS: 10 | 90 days supply | Qty: 90 | Fill #1

## 2018-03-04 ENCOUNTER — Other Ambulatory Visit: Payer: Self-pay | Admitting: Family Medicine

## 2018-03-05 DIAGNOSIS — M79604 Pain in right leg: Secondary | ICD-10-CM | POA: Diagnosis not present

## 2018-03-05 DIAGNOSIS — I87021 Postthrombotic syndrome with inflammation of right lower extremity: Secondary | ICD-10-CM | POA: Diagnosis not present

## 2018-03-05 DIAGNOSIS — M79651 Pain in right thigh: Secondary | ICD-10-CM | POA: Diagnosis not present

## 2018-03-05 NOTE — Telephone Encounter (Signed)
Dr. Clent RidgesFry please advise on refilling this medication. thanks

## 2018-03-07 MED FILL — PHENobarbital 97.2 MG TABS: 97.2 | 90 days supply | Qty: 180 | Fill #0

## 2018-03-07 NOTE — Telephone Encounter (Signed)
°  Relation to ZO:XWRUpt:self  Call back number: [phone] [facility] [place] [address]  [address] [place] [address]  Phone: [phone] Fax: [phone]  Not a 24 hour pharmacy; exact hours not known.     Reason for call:  Patient checking on the status of PHENobarbital (LUMINAL) 97.2 MG tablet request. Patient states she will run out today, patient states her pharamacy closes at 6pm and she would like to here from the nurse regarding the status.

## 2018-03-14 ENCOUNTER — Encounter: Payer: Self-pay | Admitting: Family Medicine

## 2018-03-14 MED ORDER — FREESTYLE LITE DEVI: 0 refills | Status: AC

## 2018-03-14 MED ORDER — GLUCOSE BLOOD VI STRP: ORAL_STRIP | 6 refills | Status: DC

## 2018-03-14 MED FILL — FREESTYLE LITE TEST STRIP: 90 days supply | Qty: 100 | Fill #0

## 2018-03-14 MED FILL — FREESTYLE LITE METER: 30 days supply | Qty: 1 | Fill #0

## 2018-03-14 NOTE — Addendum Note (Signed)
Addended by: Marcellus ScottADKINS, Jorge Retz L on: 03/14/2018 03:55 PM   Modules accepted: Orders

## 2018-04-05 MED FILL — HYDROCHLOROTHIAZIDE 25 MG T: 25 | 90 days supply | Qty: 90 | Fill #1

## 2018-04-05 MED FILL — metFORMIN HCL 500 MG TABS: 500 | 90 days supply | Qty: 180 | Fill #2

## 2018-05-13 MED FILL — ATORVASTATIN 40 MG TABLET: 40 | 90 days supply | Qty: 90 | Fill #1 | Status: TO

## 2018-05-13 MED FILL — LISINOPRIL 10 MG TABS: 10 | 90 days supply | Qty: 90 | Fill #2 | Status: TO

## 2018-05-23 ENCOUNTER — Encounter: Payer: Self-pay | Admitting: Family Medicine

## 2018-05-23 ENCOUNTER — Ambulatory Visit (INDEPENDENT_AMBULATORY_CARE_PROVIDER_SITE_OTHER): Payer: Self-pay | Admitting: Family Medicine

## 2018-05-23 VITALS — BP 108/80 | HR 100 | Temp 98.7°F | Resp 14 | Wt 275.0 lb

## 2018-05-23 DIAGNOSIS — R6889 Other general symptoms and signs: Secondary | ICD-10-CM

## 2018-05-23 DIAGNOSIS — J019 Acute sinusitis, unspecified: Secondary | ICD-10-CM

## 2018-05-23 DIAGNOSIS — R0981 Nasal congestion: Secondary | ICD-10-CM

## 2018-05-23 LAB — POCT INFLUENZA A/B
Influenza A, POC: NEGATIVE
Influenza B, POC: NEGATIVE

## 2018-05-23 MED ORDER — AZELASTINE HCL 0.1 % NA SOLN: 1.0000 | Freq: Two times a day (BID) | NASAL | 0 refills | Status: DC

## 2018-05-23 MED ORDER — AMOXICILLIN-POT CLAVULANATE 875-125 MG PO TABS: 1.0000 | ORAL_TABLET | Freq: Two times a day (BID) | ORAL | 0 refills | Status: AC

## 2018-05-23 MED ORDER — PSEUDOEPH-BROMPHEN-DM 30-2-10 MG/5ML PO SYRP: 10.0000 mL | ORAL_SOLUTION | Freq: Three times a day (TID) | ORAL | 0 refills | Status: DC | PRN

## 2018-05-23 MED FILL — AZELASTINE HCL 137 MCG SPRY: 0.1 | 30 days supply | Qty: 30 | Fill #0

## 2018-05-23 MED FILL — AMOX-CLAV 875-125 MG TABLET: 875-125 | 7 days supply | Qty: 14 | Fill #0

## 2018-05-23 MED FILL — BROMPHENIR-PSEUDOEPHED-DM S: 30-2-10 | 4 days supply | Qty: 120 | Fill #0

## 2018-05-23 NOTE — Progress Notes (Signed)
Lauren Espinoza is a 48 y.o. female who presents today with 4 days of bilateral ear pain   , patient has attempted treatments of OTC pseudoephedine, Advil and tylenol and this has decreased her symptoms. She reports while this has been effective for her symptoms if has not at all decreased the fullness, pressure and pain she feels in her ears. She is otherwise acutely well and under the care of her PCP for chronic health conditions she reports are well controlled. She denies fever or known sick contacts, occupationally or socially.  Review of Systems  Constitutional: Negative for chills, fever and malaise/fatigue.  HENT: Positive for congestion, ear pain and sinus pain. Negative for ear discharge and sore throat.   Eyes: Negative.   Respiratory: Positive for cough. Negative for sputum production and shortness of breath.   Cardiovascular: Negative.  Negative for chest pain.  Gastrointestinal: Negative for abdominal pain, diarrhea, nausea and vomiting.  Genitourinary: Negative for dysuria, frequency, hematuria and urgency.  Musculoskeletal: Negative for myalgias.  Skin: Negative.   Neurological: Negative for headaches.  Endo/Heme/Allergies: Negative.   Psychiatric/Behavioral: Negative.     Lauren Espinoza has a current medication list which includes the following prescription(s): aspirin, atorvastatin, glucose blood, hydrochlorothiazide, lisinopril, metformin, phenobarbital, amoxicillin-clavulanate, azelastine, freestyle lite, brompheniramine-pseudoephedrine-dm, diazepam, and furosemide. Also has No Known Allergies.  Lauren Espinoza  has a past medical history of BACK PAIN, UPPER (09/03/2008), Diabetes mellitus, DVT, HX OF (04/20/2008), Edema, Hyperlipidemia, HYPERTENSION (04/20/2008), Left shoulder pain, and SEIZURE DISORDER (04/20/2008). Also  has a past surgical history that includes Cholecystectomy; Cesarean section; and Endometrial ablation (08-09-11).    O: Vitals:   05/23/18 1556  BP: 108/80  Pulse: 100  Resp: 14   Temp: 98.7 F (37.1 C)  SpO2: 98%     Physical Exam Vitals signs reviewed.  Constitutional:      Appearance: She is well-developed. She is not toxic-appearing.  HENT:     Head: Normocephalic.     Right Ear: Hearing, ear canal and external ear normal. No middle ear effusion. There is impacted cerumen. Tympanic membrane is erythematous. Tympanic membrane is not injected.     Left Ear: Hearing, tympanic membrane, ear canal and external ear normal.  No middle ear effusion. There is impacted cerumen. Tympanic membrane is not injected, perforated or erythematous.     Ears:     Comments: Difficult and lengthy ear flush- patient report     Nose: Congestion and rhinorrhea present. Rhinorrhea is clear.     Right Sinus: Maxillary sinus tenderness and frontal sinus tenderness present.     Left Sinus: Maxillary sinus tenderness and frontal sinus tenderness present.     Mouth/Throat:     Pharynx: Uvula midline.  Neck:     Musculoskeletal: Normal range of motion and neck supple.  Cardiovascular:     Rate and Rhythm: Normal rate and regular rhythm.     Pulses: Normal pulses.     Heart sounds: Normal heart sounds.  Pulmonary:     Effort: Pulmonary effort is normal.     Breath sounds: Normal breath sounds.  Abdominal:     General: Bowel sounds are normal.     Palpations: Abdomen is soft.  Musculoskeletal: Normal range of motion.  Lymphadenopathy:     Head:     Right side of head: No submental or submandibular adenopathy.     Left side of head: No submental or submandibular adenopathy.     Cervical: No cervical adenopathy.  Neurological:     Mental Status:  She is alert and oriented to person, place, and time.    A: 1. Acute non-recurrent sinusitis, unspecified location   2. Flu-like symptoms   3. Nasal congestion    P:  1. Acute non-recurrent sinusitis, unspecified location Watchful waiting for non improvement and potential otitis bilateral ear flush only partially successful - cc  of ear pain and inability to visualized entire right ear TM- unable to discriminate if TM is erythemic- evience of increased vascularity to both ears post flush- due to severe cerumen impaction. If symptoms persist after supportive care and ear flush abx would be appropriate at 7-10 day mark of symptoms.  - amoxicillin-clavulanate (AUGMENTIN) 875-125 MG tablet; Take 1 tablet by mouth 2 (two) times daily for 7 days. - brompheniramine-pseudoephedrine-DM 30-2-10 MG/5ML syrup; Take 10 mLs by mouth 3 (three) times daily as needed. - azelastine (ASTELIN) 0.1 % nasal spray; Place 1 spray into both nostrils 2 (two) times daily. Use in each nostril as directed  2. Flu-like symptoms - POCT Influenza A/B Recent Results (from the past 2160 hour(s))  POCT Influenza A/B     Status: None   Collection Time: 05/23/18  4:22 PM  Result Value Ref Range   Influenza A, POC Negative Negative   Influenza B, POC Negative Negative    3. Nasal congestion - azelastine (ASTELIN) 0.1 % nasal spray; Place 1 spray into both nostrils 2 (two) times daily. Use in each nostril as directed   Discussed with patient exam findings, suspected diagnosis etiology and  reviewed recommended treatment plan and follow up, including complications and indications for urgent medical follow up and evaluation. Medications including use and indications reviewed with patient. Patient provided relevant patient education on diagnosis and/or relevant related condition that were discussed and reviewed with patient at discharge. Patient verbalized understanding of information provided and agrees with plan of care (POC), all questions answered.

## 2018-05-23 NOTE — Patient Instructions (Signed)
Sinusitis, Adult  OTC Plain Mucinex  Sinusitis is inflammation of your sinuses. Sinuses are hollow spaces in the bones around your face. Your sinuses are located:  Around your eyes.  In the middle of your forehead.  Behind your nose.  In your cheekbones. Mucus normally drains out of your sinuses. When your nasal tissues become inflamed or swollen, mucus can become trapped or blocked. This allows bacteria, viruses, and fungi to grow, which leads to infection. Most infections of the sinuses are caused by a virus. Sinusitis can develop quickly. It can last for up to 4 weeks (acute) or for more than 12 weeks (chronic). Sinusitis often develops after a cold. What are the causes? This condition is caused by anything that creates swelling in the sinuses or stops mucus from draining. This includes:  Allergies.  Asthma.  Infection from bacteria or viruses.  Deformities or blockages in your nose or sinuses.  Abnormal growths in the nose (nasal polyps).  Pollutants, such as chemicals or irritants in the air.  Infection from fungi (rare). What increases the risk? You are more likely to develop this condition if you:  Have a weak body defense system (immune system).  Do a lot of swimming or diving.  Overuse nasal sprays.  Smoke. What are the signs or symptoms? The main symptoms of this condition are pain and a feeling of pressure around the affected sinuses. Other symptoms include:  Stuffy nose or congestion.  Thick drainage from your nose.  Swelling and warmth over the affected sinuses.  Headache.  Upper toothache.  A cough that may get worse at night.  Extra mucus that collects in the throat or the back of the nose (postnasal drip).  Decreased sense of smell and taste.  Fatigue.  A fever.  Sore throat.  Bad breath. How is this diagnosed? This condition is diagnosed based on:  Your symptoms.  Your medical history.  A physical exam.  Tests to find out if  your condition is acute or chronic. This may include: ? Checking your nose for nasal polyps. ? Viewing your sinuses using a device that has a light (endoscope). ? Testing for allergies or bacteria. ? Imaging tests, such as an MRI or CT scan. In rare cases, a bone biopsy may be done to rule out more serious types of fungal sinus disease. How is this treated? Treatment for sinusitis depends on the cause and whether your condition is chronic or acute.  If caused by a virus, your symptoms should go away on their own within 10 days. You may be given medicines to relieve symptoms. They include: ? Medicines that shrink swollen nasal passages (topical intranasal decongestants). ? Medicines that treat allergies (antihistamines). ? A spray that eases inflammation of the nostrils (topical intranasal corticosteroids). ? Rinses that help get rid of thick mucus in your nose (nasal saline washes).  If caused by bacteria, your health care provider may recommend waiting to see if your symptoms improve. Most bacterial infections will get better without antibiotic medicine. You may be given antibiotics if you have: ? A severe infection. ? A weak immune system.  If caused by narrow nasal passages or nasal polyps, you may need to have surgery. Follow these instructions at home: Medicines  Take, use, or apply over-the-counter and prescription medicines only as told by your health care provider. These may include nasal sprays.  If you were prescribed an antibiotic medicine, take it as told by your health care provider. Do not stop taking the  antibiotic even if you start to feel better. Hydrate and humidify   Drink enough fluid to keep your urine pale yellow. Staying hydrated will help to thin your mucus.  Use a cool mist humidifier to keep the humidity level in your home above 50%.  Inhale steam for 10-15 minutes, 3-4 times a day, or as told by your health care provider. You can do this in the bathroom  while a hot shower is running.  Limit your exposure to cool or dry air. Rest  Rest as much as possible.  Sleep with your head raised (elevated).  Make sure you get enough sleep each night. General instructions   Apply a warm, moist washcloth to your face 3-4 times a day or as told by your health care provider. This will help with discomfort.  Wash your hands often with soap and water to reduce your exposure to germs. If soap and water are not available, use hand sanitizer.  Do not smoke. Avoid being around people who are smoking (secondhand smoke).  Keep all follow-up visits as told by your health care provider. This is important. Contact a health care provider if:  You have a fever.  Your symptoms get worse.  Your symptoms do not improve within 10 days. Get help right away if:  You have a severe headache.  You have persistent vomiting.  You have severe pain or swelling around your face or eyes.  You have vision problems.  You develop confusion.  Your neck is stiff.  You have trouble breathing. Summary  Sinusitis is soreness and inflammation of your sinuses. Sinuses are hollow spaces in the bones around your face.  This condition is caused by nasal tissues that become inflamed or swollen. The swelling traps or blocks the flow of mucus. This allows bacteria, viruses, and fungi to grow, which leads to infection.  If you were prescribed an antibiotic medicine, take it as told by your health care provider. Do not stop taking the antibiotic even if you start to feel better.  Keep all follow-up visits as told by your health care provider. This is important. This information is not intended to replace advice given to you by your health care provider. Make sure you discuss any questions you have with your health care provider. Document Released: 03/06/2005 Document Revised: 08/06/2017 Document Reviewed: 08/06/2017 Elsevier Interactive Patient Education  2019 [employer].

## 2018-06-04 MED FILL — PHENobarbital 97.2 MG TABS: 97.2 | 90 days supply | Qty: 180 | Fill #1

## 2018-06-21 MED FILL — HYDROCHLOROTHIAZIDE 25 MG T: 25 | 90 days supply | Qty: 90 | Fill #0

## 2018-07-08 ENCOUNTER — Telehealth: Payer: 59 | Admitting: Physician Assistant

## 2018-07-08 ENCOUNTER — Encounter: Payer: Self-pay | Admitting: Physician Assistant

## 2018-07-08 DIAGNOSIS — M25519 Pain in unspecified shoulder: Secondary | ICD-10-CM | POA: Diagnosis not present

## 2018-07-08 MED ORDER — METHOCARBAMOL 500 MG PO TABS: 500.0000 mg | ORAL_TABLET | Freq: Three times a day (TID) | ORAL | 0 refills | Status: DC

## 2018-07-08 MED ORDER — NAPROXEN 500 MG PO TABS: 500.0000 mg | ORAL_TABLET | Freq: Two times a day (BID) | ORAL | 0 refills | Status: DC

## 2018-07-08 MED FILL — METHOCARBAMOL 500 MG TABLET: 500 | 5 days supply | Qty: 15 | Fill #0

## 2018-07-08 MED FILL — NAPROXEN 500 MG TABLET: 500 | 10 days supply | Qty: 20 | Fill #0

## 2018-07-08 NOTE — Progress Notes (Signed)
We are sorry that you are not feeling well.  Here is how we plan to help!  Based on what you have shared with me it looks like you mostly have acute back pain.  Acute back pain is defined as musculoskeletal pain that can resolve in 1-3 weeks with conservative treatment.  I have prescribed Naprosyn 500 mg twice a day non-steroid anti-inflammatory (NSAID) as well as robaxin 500 mg three times daily. This is a muscle relaxant and can cause sedation. Avoid driving or operating machinery.   If you develop any skin rash, see your doctor as it may be a sign of shingles.    Some patients experience stomach irritation or in increased heartburn with anti-inflammatory drugs.  Please keep in mind that muscle relaxer's can cause fatigue and should not be taken while at work or driving.  Back pain is very common.  The pain often gets better over time.  The cause of back pain is usually not dangerous.  Most people can learn to manage their back pain on their own.  Home Care  Stay active.  Start with short walks on flat ground if you can.  Try to walk farther each day.  Do not sit, drive or stand in one place for more than 30 minutes.  Do not stay in bed.  Do not avoid exercise or work.  Activity can help your back heal faster.  Be careful when you bend or lift an object.  Bend at your knees, keep the object close to you, and do not twist.  Sleep on a firm mattress.  Lie on your side, and bend your knees.  If you lie on your back, put a pillow under your knees.  Only take medicines as told by your doctor.  Put ice on the injured area.  Put ice in a plastic bag  Place a towel between your skin and the bag  Leave the ice on for 15-20 minutes, 3-4 times a day for the first 2-3 days. 210 After that, you can switch between ice and heat packs.  Ask your doctor about back exercises or massage.  Avoid feeling anxious or stressed.  Find good ways to deal with stress, such as exercise.  Get Help Right  Way If:  Your pain does not go away with rest or medicine.  Your pain does not go away in 1 week.  You have new problems.  You do not feel well.  The pain spreads into your legs.  You cannot control when you poop (bowel movement) or pee (urinate)  You feel sick to your stomach (nauseous) or throw up (vomit)  You have belly (abdominal) pain.  You feel like you may pass out (faint).  If you develop a fever.  Make Sure you:  Understand these instructions.  Will watch your condition  Will get help right away if you are not doing well or get worse.  Your e-visit answers were reviewed by a board certified advanced clinical practitioner to complete your personal care plan.  Depending on the condition, your plan could have included both over the counter or prescription medications.  If there is a problem please reply  once you have received a response from your provider.  Your safety is important to [place].  If you have drug allergies check your prescription carefully.    You can use MyChart to ask questions about today's visit, request a non-urgent call back, or ask for a work or school excuse for 24 hours  related to this e-Visit. If it has been greater than 24 hours you will need to follow up with your provider, or enter a new e-Visit to address those concerns.  You will get an e-mail in the next two days asking about your experience.  I hope that your e-visit has been valuable and will speed your recovery. Thank you for using e-visits.  I have spent 7 min in completion and review of this note- Illa LevelSahar Osman [facility]

## 2018-07-09 DIAGNOSIS — M7502 Adhesive capsulitis of left shoulder: Secondary | ICD-10-CM | POA: Diagnosis not present

## 2018-07-16 MED FILL — METHOCARBAMOL 500 MG TABLET: 500 | 20 days supply | Qty: 60 | Fill #0

## 2018-07-16 MED FILL — NAPROXEN 500 MG TABLET: 500 | 30 days supply | Qty: 60 | Fill #0

## 2018-07-16 MED FILL — metFORMIN HCL 500 MG TABS: 500 | 90 days supply | Qty: 180 | Fill #0

## 2018-08-02 MED FILL — LISINOPRIL 10 MG TABLET: 10 | 90 days supply | Qty: 90 | Fill #0

## 2018-08-18 ENCOUNTER — Encounter: Payer: Self-pay | Admitting: Family Medicine

## 2018-08-19 NOTE — Telephone Encounter (Signed)
Dr. Fry please advise. Thanks  

## 2018-08-20 NOTE — Telephone Encounter (Signed)
She can apply Rogaine to her scalp. This is OTC and sometimes helps if used for 3-6 months

## 2018-08-27 MED FILL — ATORVASTATIN 40 MG TABLET: 40 | 90 days supply | Qty: 90 | Fill #0

## 2018-08-27 MED FILL — FUROSEMIDE 20 MG TABS: 20 | 90 days supply | Qty: 90 | Fill #0

## 2018-08-28 DIAGNOSIS — L659 Nonscarring hair loss, unspecified: Secondary | ICD-10-CM | POA: Diagnosis not present

## 2018-08-28 DIAGNOSIS — L218 Other seborrheic dermatitis: Secondary | ICD-10-CM | POA: Diagnosis not present

## 2018-09-03 ENCOUNTER — Other Ambulatory Visit: Payer: Self-pay | Admitting: Family Medicine

## 2018-09-04 ENCOUNTER — Encounter: Payer: Self-pay | Admitting: Family Medicine

## 2018-09-05 NOTE — Telephone Encounter (Signed)
Dr. Fry please advise. Thanks  

## 2018-09-06 ENCOUNTER — Other Ambulatory Visit: Payer: Self-pay | Admitting: Family Medicine

## 2018-09-06 NOTE — Telephone Encounter (Signed)
Call in #180 with one rf  

## 2018-09-12 ENCOUNTER — Other Ambulatory Visit: Payer: Self-pay

## 2018-09-12 ENCOUNTER — Other Ambulatory Visit: Payer: Self-pay | Admitting: Family Medicine

## 2018-09-12 MED ORDER — PHENOBARBITAL 97.2 MG PO TABS: 97.2000 mg | ORAL_TABLET | Freq: Two times a day (BID) | ORAL | 1 refills | Status: DC

## 2018-09-12 MED FILL — PHENobarbital 97.2 MG TABS: 97.2 | 90 days supply | Qty: 180 | Fill #0

## 2018-09-12 NOTE — Telephone Encounter (Signed)
Pt called office stating that her Rx for Phenobarbital was not received by her pharmacy.Spoke with [facility] and  Verify, notified Dr Sarajane Jews who okayed Rx  fax to pt  Pharmacy and confirmation received.

## 2018-09-13 MED FILL — HYDROCHLOROTHIAZIDE 25 MG T: 25 | 90 days supply | Qty: 90 | Fill #1

## 2018-10-08 ENCOUNTER — Other Ambulatory Visit: Payer: Self-pay | Admitting: Family Medicine

## 2018-10-08 MED FILL — metFORMIN HCL 500 MG TABS: 500 | 90 days supply | Qty: 180 | Fill #0

## 2018-10-16 ENCOUNTER — Encounter: Payer: Self-pay | Admitting: Family Medicine

## 2018-10-21 ENCOUNTER — Other Ambulatory Visit: Payer: Self-pay

## 2018-10-21 DIAGNOSIS — Z20822 Contact with and (suspected) exposure to covid-19: Secondary | ICD-10-CM

## 2018-10-21 DIAGNOSIS — R6889 Other general symptoms and signs: Secondary | ICD-10-CM | POA: Diagnosis not present

## 2018-10-22 ENCOUNTER — Other Ambulatory Visit: Payer: Self-pay | Admitting: Family Medicine

## 2018-10-22 LAB — NOVEL CORONAVIRUS, NAA: SARS-CoV-2, NAA: NOT DETECTED

## 2018-10-22 MED FILL — NAPROXEN 500 MG TABLET: 500 | 30 days supply | Qty: 60 | Fill #1

## 2018-10-22 MED FILL — LISINOPRIL 10 MG TABS: 10 | 90 days supply | Qty: 90 | Fill #0

## 2018-10-22 MED FILL — METHOCARBAMOL 500 MG TABS: 500 | 20 days supply | Qty: 60 | Fill #1

## 2018-11-07 ENCOUNTER — Other Ambulatory Visit: Payer: Self-pay

## 2018-11-07 ENCOUNTER — Ambulatory Visit (INDEPENDENT_AMBULATORY_CARE_PROVIDER_SITE_OTHER): Payer: 59 | Admitting: Family Medicine

## 2018-11-07 ENCOUNTER — Encounter: Payer: Self-pay | Admitting: Family Medicine

## 2018-11-07 VITALS — BP 130/80 | HR 83

## 2018-11-07 DIAGNOSIS — E119 Type 2 diabetes mellitus without complications: Secondary | ICD-10-CM | POA: Diagnosis not present

## 2018-11-07 DIAGNOSIS — Z0000 Encounter for general adult medical examination without abnormal findings: Secondary | ICD-10-CM

## 2018-11-07 DIAGNOSIS — R569 Unspecified convulsions: Secondary | ICD-10-CM | POA: Diagnosis not present

## 2018-11-07 LAB — IBC + FERRITIN
Ferritin: 41.8 ng/mL (ref 10.0–291.0)
Iron: 99 ug/dL (ref 42–145)
Saturation Ratios: 33.4 % (ref 20.0–50.0)
Transferrin: 212 mg/dL (ref 212.0–360.0)

## 2018-11-07 LAB — LIPID PANEL
Cholesterol: 150 mg/dL (ref 0–200)
HDL: 46.9 mg/dL
LDL Cholesterol: 73 mg/dL (ref 0–99)
NonHDL: 103.2
Total CHOL/HDL Ratio: 3
Triglycerides: 149 mg/dL (ref 0.0–149.0)
VLDL: 29.8 mg/dL (ref 0.0–40.0)

## 2018-11-07 LAB — CBC WITH DIFFERENTIAL/PLATELET
Basophils Absolute: 0.1 10*3/uL (ref 0.0–0.1)
Basophils Relative: 1 % (ref 0.0–3.0)
Eosinophils Absolute: 0.1 10*3/uL (ref 0.0–0.7)
Eosinophils Relative: 2 % (ref 0.0–5.0)
HCT: 40.5 % (ref 36.0–46.0)
Hemoglobin: 13.8 g/dL (ref 12.0–15.0)
Lymphocytes Relative: 31.5 % (ref 12.0–46.0)
Lymphs Abs: 2 10*3/uL (ref 0.7–4.0)
MCHC: 34 g/dL (ref 30.0–36.0)
MCV: 91 fl (ref 78.0–100.0)
Monocytes Absolute: 0.5 10*3/uL (ref 0.1–1.0)
Monocytes Relative: 7.8 % (ref 3.0–12.0)
Neutro Abs: 3.7 10*3/uL (ref 1.4–7.7)
Neutrophils Relative %: 57.7 % (ref 43.0–77.0)
Platelets: 314 10*3/uL (ref 150.0–400.0)
RBC: 4.46 Mil/uL (ref 3.87–5.11)
RDW: 12.7 % (ref 11.5–15.5)
WBC: 6.5 10*3/uL (ref 4.0–10.5)

## 2018-11-07 LAB — BASIC METABOLIC PANEL WITH GFR
BUN: 12 mg/dL (ref 6–23)
CO2: 29 meq/L (ref 19–32)
Calcium: 9.4 mg/dL (ref 8.4–10.5)
Chloride: 102 meq/L (ref 96–112)
Creatinine, Ser: 0.64 mg/dL (ref 0.40–1.20)
GFR: 100.05 mL/min
Glucose, Bld: 126 mg/dL — ABNORMAL HIGH (ref 70–99)
Potassium: 3.9 meq/L (ref 3.5–5.1)
Sodium: 141 meq/L (ref 135–145)

## 2018-11-07 LAB — HEMOGLOBIN A1C: Hgb A1c MFr Bld: 6 % (ref 4.6–6.5)

## 2018-11-07 LAB — T4, FREE: Free T4: 0.68 ng/dL (ref 0.60–1.60)

## 2018-11-07 LAB — HEPATIC FUNCTION PANEL
ALT: 18 U/L (ref 0–35)
AST: 12 U/L (ref 0–37)
Albumin: 4.2 g/dL (ref 3.5–5.2)
Alkaline Phosphatase: 40 U/L (ref 39–117)
Bilirubin, Direct: 0.1 mg/dL (ref 0.0–0.3)
Total Bilirubin: 0.3 mg/dL (ref 0.2–1.2)
Total Protein: 6.4 g/dL (ref 6.0–8.3)

## 2018-11-07 LAB — URIC ACID: Uric Acid, Serum: 7.8 mg/dL — ABNORMAL HIGH (ref 2.4–7.0)

## 2018-11-07 LAB — TSH: TSH: 3.78 u[IU]/mL (ref 0.35–4.50)

## 2018-11-07 LAB — T3, FREE: T3, Free: 3.5 pg/mL (ref 2.3–4.2)

## 2018-11-07 MED ORDER — LISINOPRIL 10 MG PO TABS: 10.0000 mg | ORAL_TABLET | Freq: Every day | ORAL | 3 refills | Status: DC

## 2018-11-07 MED ORDER — METFORMIN HCL 500 MG PO TABS: 500.0000 mg | ORAL_TABLET | Freq: Two times a day (BID) | ORAL | 3 refills | Status: DC

## 2018-11-07 MED ORDER — ATORVASTATIN CALCIUM 40 MG PO TABS: 40.0000 mg | ORAL_TABLET | Freq: Every day | ORAL | 3 refills | Status: DC

## 2018-11-07 MED ORDER — HYDROCHLOROTHIAZIDE 25 MG PO TABS: 25.0000 mg | ORAL_TABLET | Freq: Every day | ORAL | 3 refills | Status: DC

## 2018-11-07 NOTE — Progress Notes (Signed)
   Subjective:    Patient ID: Lauren Espinoza, female    DOB: [birth year], 48 y.o.   MRN: [id number]  HPI Here for a well exam. She feels great. Her am fasting glucoses average 100-110.    Review of Systems  Constitutional: Negative.   HENT: Negative.   Eyes: Negative.   Respiratory: Negative.   Cardiovascular: Negative.   Gastrointestinal: Negative.   Genitourinary: Negative for decreased urine volume, difficulty urinating, dyspareunia, dysuria, enuresis, flank pain, frequency, hematuria, pelvic pain and urgency.  Musculoskeletal: Negative.   Skin: Negative.   Neurological: Negative.   Psychiatric/Behavioral: Negative.        Objective:   Physical Exam Constitutional:      General: She is not in acute distress.    Appearance: She is well-developed.  HENT:     Head: Normocephalic and atraumatic.     Right Ear: External ear normal.     Left Ear: External ear normal.     Nose: Nose normal.     Mouth/Throat:     Pharynx: No oropharyngeal exudate.  Eyes:     General: No scleral icterus.    Conjunctiva/sclera: Conjunctivae normal.     Pupils: Pupils are equal, round, and reactive to light.  Neck:     Musculoskeletal: Normal range of motion and neck supple.     Thyroid: No thyromegaly.     Vascular: No JVD.  Cardiovascular:     Rate and Rhythm: Normal rate and regular rhythm.     Heart sounds: Normal heart sounds. No murmur. No friction rub. No gallop.   Pulmonary:     Effort: Pulmonary effort is normal. No respiratory distress.     Breath sounds: Normal breath sounds. No wheezing or rales.  Chest:     Chest wall: No tenderness.  Abdominal:     General: Bowel sounds are normal. There is no distension.     Palpations: Abdomen is soft. There is no mass.     Tenderness: There is no abdominal tenderness. There is no guarding or rebound.  Musculoskeletal: Normal range of motion.        General: No tenderness.  Lymphadenopathy:     Cervical: No cervical adenopathy.  Skin:     General: Skin is warm and dry.     Findings: No erythema or rash.  Neurological:     Mental Status: She is alert and oriented to person, place, and time.     Cranial Nerves: No cranial nerve deficit.     Motor: No abnormal muscle tone.     Coordination: Coordination normal.     Deep Tendon Reflexes: Reflexes are normal and symmetric. Reflexes normal.  Psychiatric:        Behavior: Behavior normal.        Thought Content: Thought content normal.        Judgment: Judgment normal.           Assessment & Plan:  Well exam. We discussed diet and exercise. Get fasting labs. Alysia Penna, MD

## 2018-11-09 LAB — PHENOBARBITAL LEVEL: Phenobarbital, Serum: 18.2 mg/L (ref 15.0–40.0)

## 2018-12-09 MED FILL — ATORVASTATIN 40 MG TABLET: 40 | 90 days supply | Qty: 90 | Fill #0

## 2018-12-09 MED FILL — HYDROCHLOROTHIAZIDE 25 MG T: 25 | 90 days supply | Qty: 90 | Fill #0

## 2018-12-12 MED FILL — PHENobarbital 97.2 MG TABS: 97.2 | 90 days supply | Qty: 180 | Fill #0

## 2018-12-19 ENCOUNTER — Other Ambulatory Visit: Payer: Self-pay

## 2018-12-19 MED ORDER — HYDROCHLOROTHIAZIDE 25 MG PO TABS: 25.0000 mg | ORAL_TABLET | Freq: Every day | ORAL | 3 refills | Status: DC

## 2018-12-31 DIAGNOSIS — E119 Type 2 diabetes mellitus without complications: Secondary | ICD-10-CM | POA: Diagnosis not present

## 2018-12-31 DIAGNOSIS — H52223 Regular astigmatism, bilateral: Secondary | ICD-10-CM | POA: Diagnosis not present

## 2018-12-31 DIAGNOSIS — H524 Presbyopia: Secondary | ICD-10-CM | POA: Diagnosis not present

## 2018-12-31 DIAGNOSIS — H5203 Hypermetropia, bilateral: Secondary | ICD-10-CM | POA: Diagnosis not present

## 2018-12-31 LAB — HM DIABETES EYE EXAM

## 2019-01-13 MED FILL — metFORMIN HCL 500 MG TABS: 500 | 90 days supply | Qty: 180 | Fill #0

## 2019-01-31 DIAGNOSIS — Z01419 Encounter for gynecological examination (general) (routine) without abnormal findings: Secondary | ICD-10-CM | POA: Diagnosis not present

## 2019-01-31 DIAGNOSIS — Z1231 Encounter for screening mammogram for malignant neoplasm of breast: Secondary | ICD-10-CM | POA: Diagnosis not present

## 2019-02-03 MED FILL — LISINOPRIL 10 MG TABS: 10 | 90 days supply | Qty: 90 | Fill #0

## 2019-02-07 DIAGNOSIS — F4323 Adjustment disorder with mixed anxiety and depressed mood: Secondary | ICD-10-CM | POA: Diagnosis not present

## 2019-02-15 ENCOUNTER — Telehealth: Payer: 59 | Admitting: Physician Assistant

## 2019-02-15 DIAGNOSIS — J029 Acute pharyngitis, unspecified: Secondary | ICD-10-CM

## 2019-02-15 DIAGNOSIS — J069 Acute upper respiratory infection, unspecified: Secondary | ICD-10-CM

## 2019-02-15 MED ORDER — BENZONATATE 100 MG PO CAPS: 100.0000 mg | ORAL_CAPSULE | Freq: Three times a day (TID) | ORAL | 0 refills | Status: DC | PRN

## 2019-02-15 NOTE — Progress Notes (Signed)
We are sorry you are not feeling well.  Here is how we plan to help!  Based on what you have shared with me, it looks like you may have a viral upper respiratory infection.  Upper respiratory infections are caused by a large number of viruses; however, rhinovirus is the most common cause.   Symptoms vary from person to person, with common symptoms including sore throat, cough, fatigue or lack of energy and feeling of general discomfort.  A low-grade fever of up to 100.4 may present, but is often uncommon.  Symptoms vary however, and are closely related to a person's age or underlying illnesses.  The most common symptoms associated with an upper respiratory infection are nasal discharge or congestion, cough, sneezing, headache and pressure in the ears and face.  These symptoms usually persist for about 3 to 10 days, but can last up to 2 weeks.  It is important to know that upper respiratory infections do not cause serious illness or complications in most cases.    Upper respiratory infections can be transmitted from person to person, with the most common method of transmission being a person's hands.  The virus is able to live on the skin and can infect other persons for up to 2 hours after direct contact.  Also, these can be transmitted when someone coughs or sneezes; thus, it is important to cover the mouth to reduce this risk.  To keep the spread of the illness at bay, good hand hygiene is very important.  This is an infection that is most likely caused by a virus. There are no specific treatments other than to help you with the symptoms until the infection runs its course.  We are sorry you are not feeling well.  Here is how we plan to help!   For nasal congestion, you may use an oral decongestants such as Mucinex D or if you have glaucoma or high blood pressure use plain Mucinex.  Saline nasal spray or nasal drops can help and can safely be used as often as needed for congestion.   If you do not  have a history of heart disease, hypertension, diabetes or thyroid disease, prostate/bladder issues or glaucoma, you may also use Sudafed to treat nasal congestion.  It is highly recommended that you consult with a pharmacist or your primary care physician to ensure this medication is safe for you to take.     If you have a cough, you may use cough suppressants such as Delsym and Robitussin.  If you have glaucoma or high blood pressure, you can also use Coricidin HBP.   For cough I have prescribed for you A prescription cough medication called Tessalon Perles 100 mg. You may take 1-2 capsules every 8 hours as needed for cough  If you have a sore or scratchy throat, use a saltwater gargle-  to  teaspoon of salt dissolved in a 4-ounce to 8-ounce glass of warm water.  Gargle the solution for approximately 15-30 seconds and then spit.  It is important not to swallow the solution.  You can also use throat lozenges/cough drops and Chloraseptic spray to help with throat pain or discomfort.  Warm or cold liquids can also be helpful in relieving throat pain.  For headache, pain or general discomfort, you can use Ibuprofen or Tylenol as directed.   Some authorities believe that zinc sprays or the use of Echinacea may shorten the course of your symptoms.   HOME CARE . Only take medications as   instructed by your medical team. . Be sure to drink plenty of fluids. Water is fine as well as fruit juices, sodas and electrolyte beverages. You may want to stay away from caffeine or alcohol. If you are nauseated, try taking small sips of liquids. How do you know if you are getting enough fluid? Your urine should be a pale yellow or almost colorless. . Get rest. . Taking a steamy shower or using a humidifier may help nasal congestion and ease sore throat pain. You can place a towel over your head and breathe in the steam from hot water coming from a faucet. . Using a saline nasal spray works much the same way. . Cough  drops, hard candies and sore throat lozenges may ease your cough. . Avoid close contacts especially the very young and the elderly . Cover your mouth if you cough or sneeze . Always remember to wash your hands.   GET HELP RIGHT AWAY IF: . You develop worsening fever. . If your symptoms do not improve within 10 days . You develop yellow or green discharge from your nose over 3 days. . You have coughing fits . You develop a severe head ache or visual changes. . You develop shortness of breath, difficulty breathing or start having chest pain . Your symptoms persist after you have completed your treatment plan  MAKE SURE YOU   Understand these instructions.  Will watch your condition.  Will get help right away if you are not doing well or get worse.  Your e-visit answers were reviewed by a board certified advanced clinical practitioner to complete your personal care plan. Depending upon the condition, your plan could have included both over the counter or prescription medications. Please review your pharmacy choice. If there is a problem, you may call our nursing hot line at and have the prescription routed to another pharmacy. Your safety is important to us. If you have drug allergies check your prescription carefully.   You can use MyChart to ask questions about today's visit, request a non-urgent call back, or ask for a work or school excuse for 24 hours related to this e-Visit. If it has been greater than 24 hours you will need to follow up with your provider, or enter a new e-Visit to address those concerns. You will get an e-mail in the next two days asking about your experience.  I hope that your e-visit has been valuable and will speed your recovery. Thank you for using e-visits.      

## 2019-02-15 NOTE — Progress Notes (Signed)
I have spent 5 minutes in review of e-visit questionnaire, review and updating patient chart, medical decision making and response to patient.   Brodie Correll Cody Mylo Driskill, PA-C    

## 2019-02-18 ENCOUNTER — Encounter: Payer: Self-pay | Admitting: Family Medicine

## 2019-02-18 ENCOUNTER — Other Ambulatory Visit: Payer: Self-pay

## 2019-02-18 ENCOUNTER — Telehealth (INDEPENDENT_AMBULATORY_CARE_PROVIDER_SITE_OTHER): Payer: 59 | Admitting: Family Medicine

## 2019-02-18 DIAGNOSIS — J019 Acute sinusitis, unspecified: Secondary | ICD-10-CM

## 2019-02-18 MED ORDER — AMOXICILLIN-POT CLAVULANATE 875-125 MG PO TABS: 1.0000 | ORAL_TABLET | Freq: Two times a day (BID) | ORAL | 0 refills | Status: DC

## 2019-02-18 NOTE — Progress Notes (Signed)
Virtual Visit via Telephone Note  I connected with the patient on 02/18/19 at  8:30 AM EST by telephone and verified that I am speaking with the correct person using two identifiers. We attempted to connect virtually but we had technical difficulties with the audio and video.     I discussed the limitations, risks, security and privacy concerns of performing an evaluation and management service by telephone and the availability of in person appointments. I also discussed with the patient that there may be a patient responsible charge related to this service. The patient expressed understanding and agreed to proceed.  Location patient: home Location provider: work or home office Participants present for the call: patient, provider Patient did not have a visit in the prior 7 days to address this/these issue(s).   History of Present Illness: Here for 6 days of sinus pressure, bilateral ear pain, PND, and coughing up green sputum. No fever or body aches. No SOB or NVD. Using Flonase, Robitussin, and Dayquil.    Observations/Objective: Patient sounds cheerful and well on the phone. I do not appreciate any SOB. Speech and thought processing are grossly intact. Patient reported vitals:  Assessment and Plan: Sinusitis, treat with Augmentin.  Alysia Penna, MD   Follow Up Instructions:     [phone] [phone] 11-20 [phone] 21-30 I did not refer this patient for an OV in the next 24 hours for this/these issue(s).  I discussed the assessment and treatment plan with the patient. The patient was provided an opportunity to ask questions and all were answered. The patient agreed with the plan and demonstrated an understanding of the instructions.   The patient was advised to call back or seek an in-person evaluation if the symptoms worsen or if the condition fails to improve as anticipated.  I provided 12 minutes of non-face-to-face time during this encounter.   Alysia Penna, MD

## 2019-02-20 DIAGNOSIS — F4323 Adjustment disorder with mixed anxiety and depressed mood: Secondary | ICD-10-CM | POA: Diagnosis not present

## 2019-02-26 MED FILL — HYDROCHLOROTHIAZIDE 25 MG T: 25 | 90 days supply | Qty: 90 | Fill #0

## 2019-02-27 DIAGNOSIS — F4323 Adjustment disorder with mixed anxiety and depressed mood: Secondary | ICD-10-CM | POA: Diagnosis not present

## 2019-03-06 MED FILL — ATORVASTATIN 40 MG TABLET: 40 | 90 days supply | Qty: 90 | Fill #1

## 2019-03-19 ENCOUNTER — Other Ambulatory Visit: Payer: Self-pay | Admitting: Family Medicine

## 2019-03-19 NOTE — Telephone Encounter (Signed)
Okay for refill?  

## 2019-03-20 MED FILL — PHENobarbital 97.2 MG TABS: 97.2 | 90 days supply | Qty: 180 | Fill #0

## 2019-03-25 DIAGNOSIS — F4323 Adjustment disorder with mixed anxiety and depressed mood: Secondary | ICD-10-CM | POA: Diagnosis not present

## 2019-03-26 ENCOUNTER — Telehealth: Payer: Self-pay | Admitting: Family Medicine

## 2019-03-26 NOTE — Telephone Encounter (Signed)
Patient called in stating she is still waiting for her medication to be sent in to pharmacy. Pt is now becoming a bit worried as it is close to end of the day and she says she cannot miss any of this medicine for her epilepsy. Please advise.

## 2019-03-26 NOTE — Telephone Encounter (Signed)
Pt called and stated that she would like a 7 day supply of PHENobarbital (LUMINAL) 97.2 MG tablet [[id number]]  sent to the pharmacy. Pt states that [employer] will be late getting her medication to her. Pt states that she really need this done today so anything we could do would be great.   [facility] - [place], [place] - [address] [place] Phone:  [phone]  Fax:  [phone]

## 2019-03-26 NOTE — Telephone Encounter (Signed)
This has already been forwarded to PCP please f/u

## 2019-03-27 MED ORDER — PHENOBARBITAL 97.2 MG PO TABS: 97.2000 mg | ORAL_TABLET | Freq: Two times a day (BID) | ORAL | 0 refills | Status: DC

## 2019-03-27 NOTE — Telephone Encounter (Signed)
Message communicated to patient.

## 2019-03-27 NOTE — Telephone Encounter (Signed)
I sent in a 7 day supply  

## 2019-04-03 DIAGNOSIS — F4323 Adjustment disorder with mixed anxiety and depressed mood: Secondary | ICD-10-CM | POA: Diagnosis not present

## 2019-04-09 DIAGNOSIS — F4323 Adjustment disorder with mixed anxiety and depressed mood: Secondary | ICD-10-CM | POA: Diagnosis not present

## 2019-04-17 DIAGNOSIS — F4323 Adjustment disorder with mixed anxiety and depressed mood: Secondary | ICD-10-CM | POA: Diagnosis not present

## 2019-04-18 IMAGING — MR MR LUMBAR SPINE W/O CM
4 of 5 series · 19 of 48 positions shown · non-contrast
Comparison: None.

CLINICAL DATA: Lumbar radiculopathy right sided pain

EXAM:
MRI LUMBAR SPINE WITHOUT CONTRAST
TECHNIQUE: Multiplanar, multisequence MR imaging of the lumbar spine was
performed. No intravenous contrast was administered.

[Series 6: T2 · sagittal · 4.0mm · 0.73mm/px · 6 of 13 slices shown (1 of 2)]
[im 1/13]
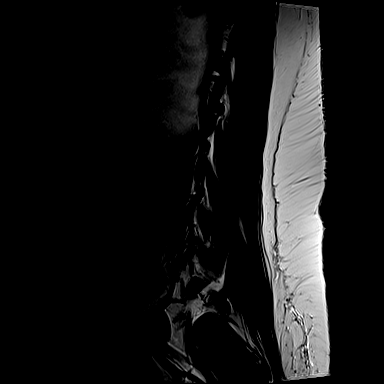
[im 3/13]
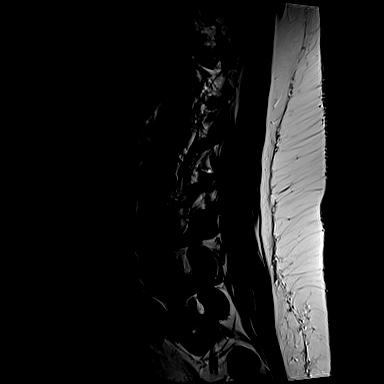
[im 5/13]
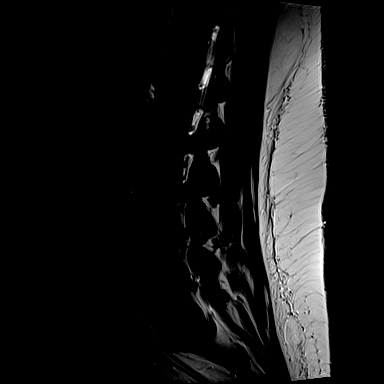
[im 8/13]
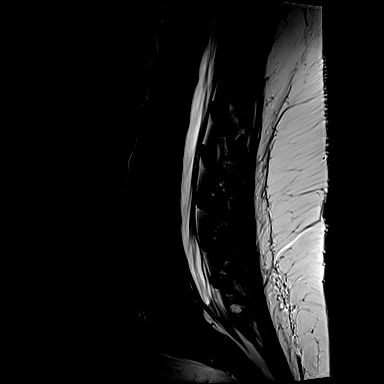
[im 10/13]
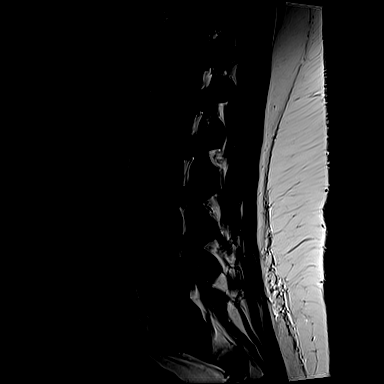
[im 13/13]
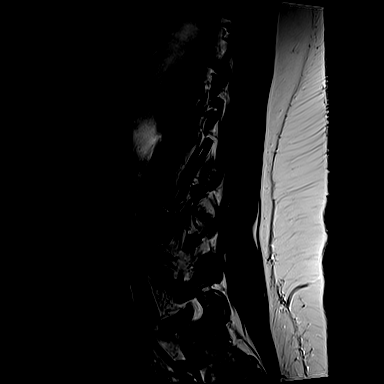

[Series 7: T1 · sagittal · 4.0mm · 0.88mm/px · 3 of 13 slices shown (1 of 2)]
[im 3/13]
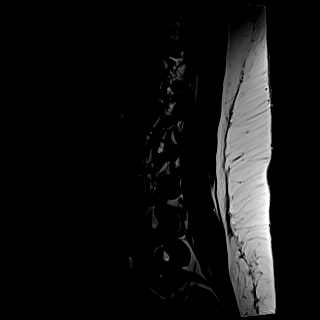
[im 8/13]
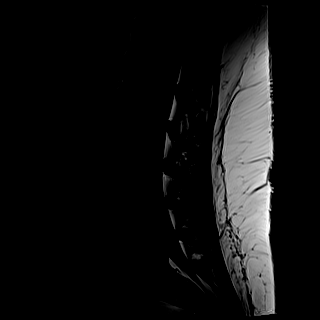
[im 13/13]
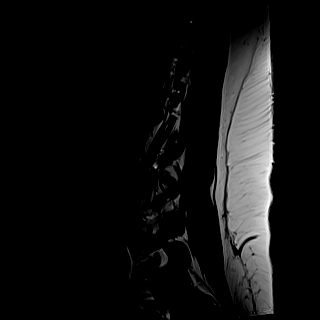

[Series 11: T1 · axial · 4.0mm · 0.28mm/px · z∈[-53,+104]mm · 3 of 35 slices shown (2 of 2)]
[im 5/35]
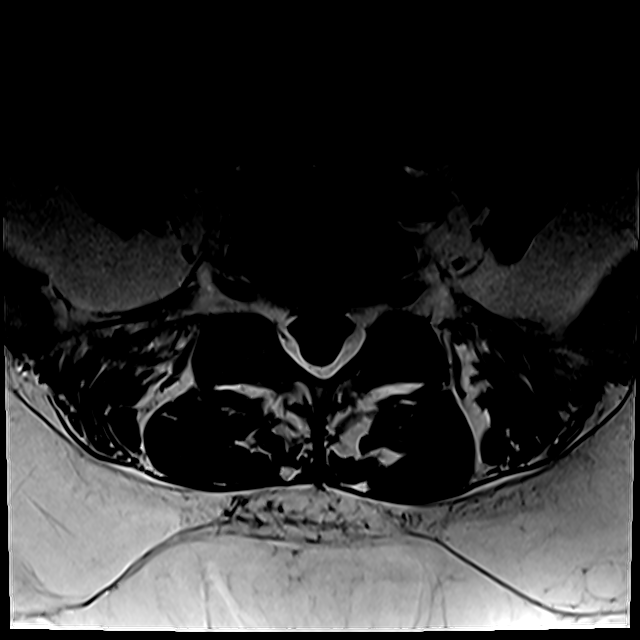
[im 18/35]
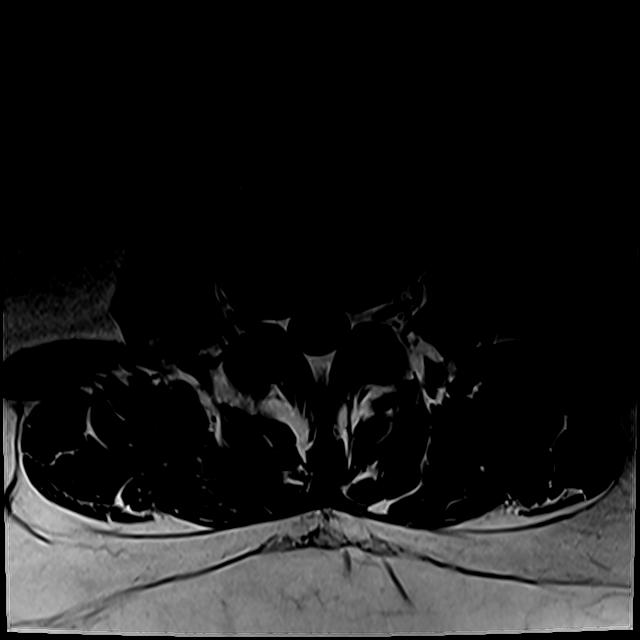
[im 30/35]
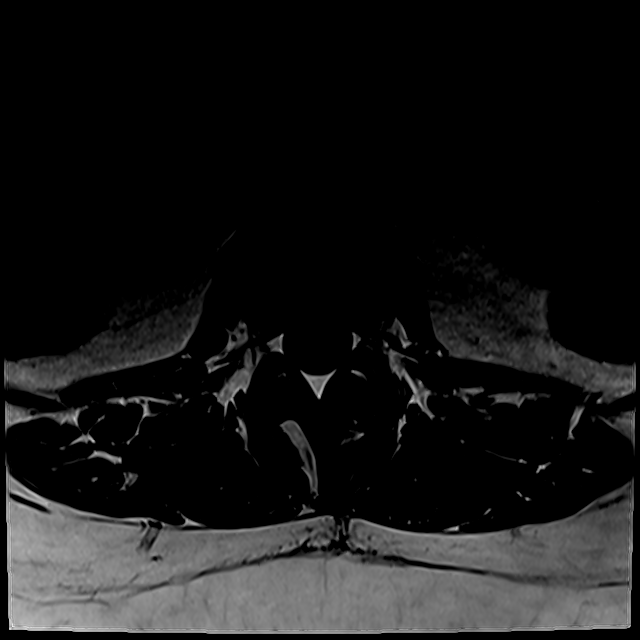

[Series 14: T2 · axial · 4.0mm · 0.28mm/px · z∈[-72,+104]mm · 7 of 35 slices shown (2 of 2)]
[im 1/35]
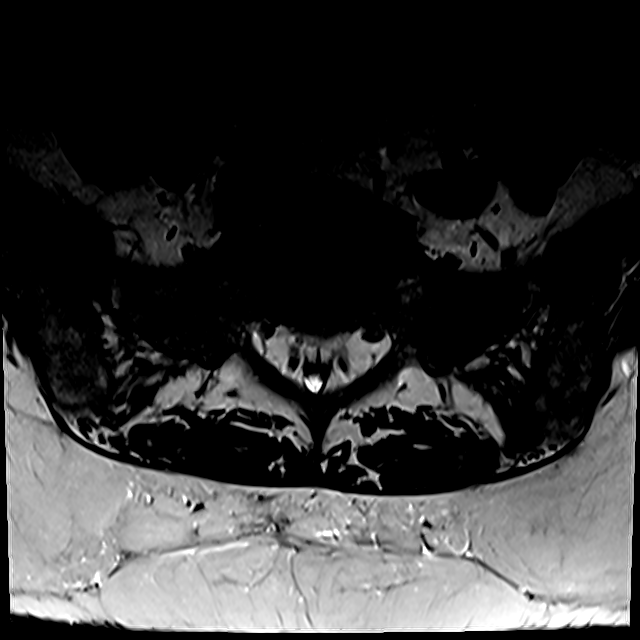
[im 5/35]
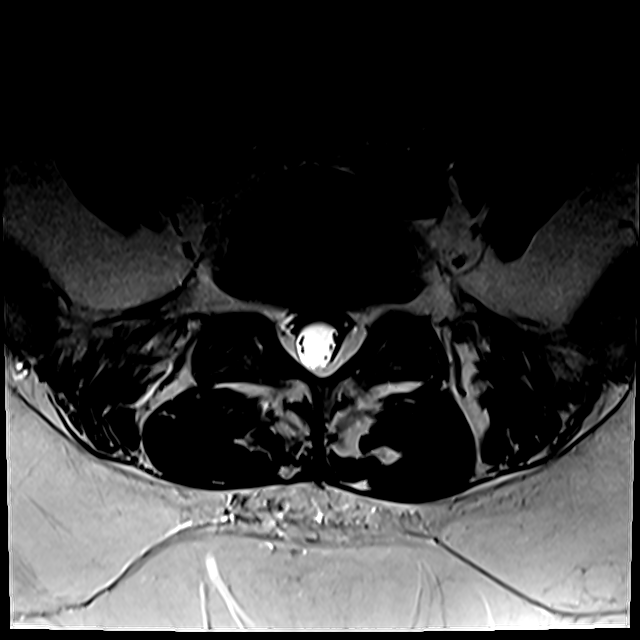
[im 10/35]
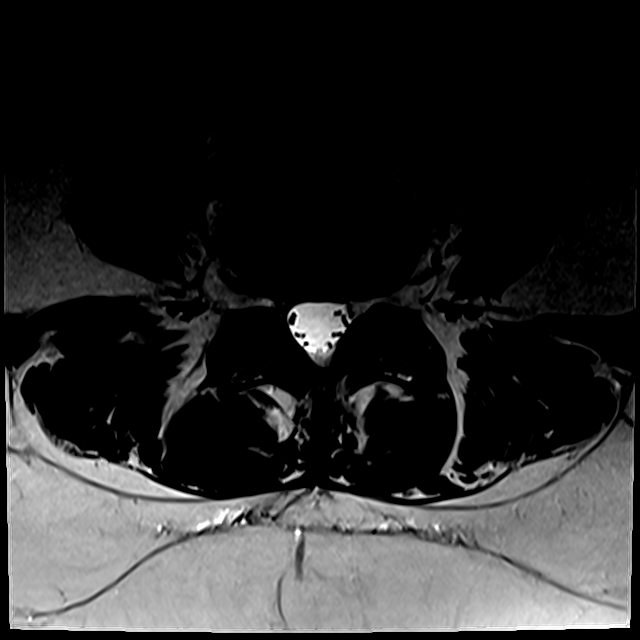
[im 15/35]
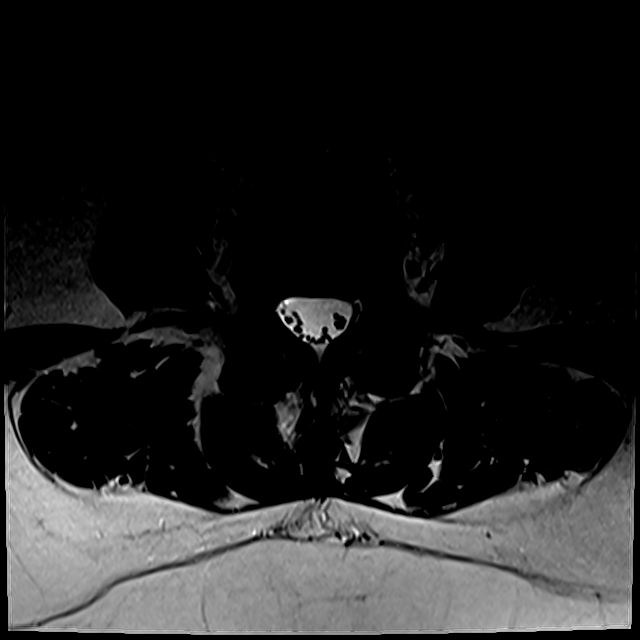
[im 18/35]
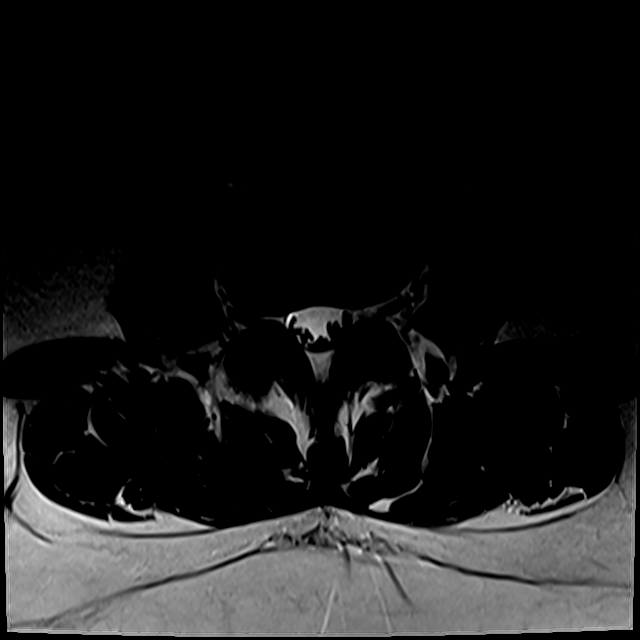
[im 20/35]
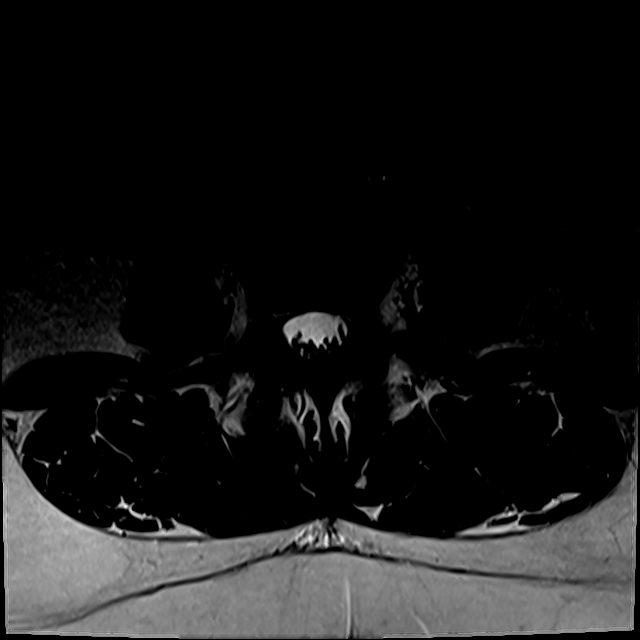
[im 30/35]
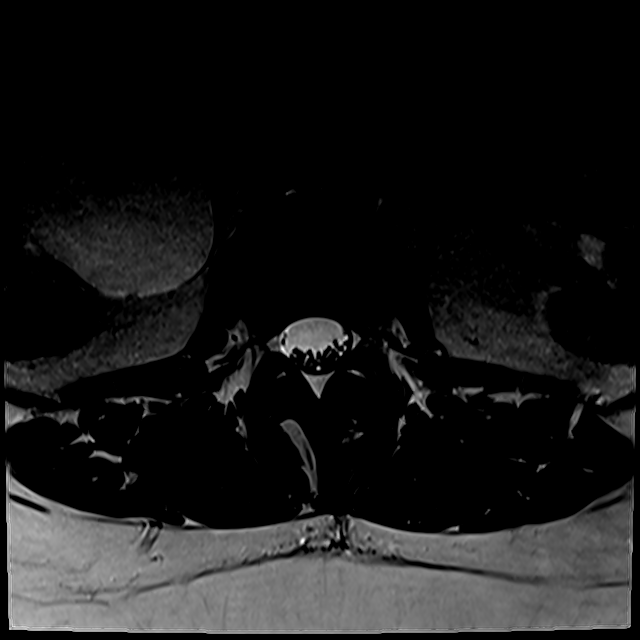

[19 of 48 positions shown; findings below may reference images not displayed]

FINDINGS: Segmentation:  Standard

Alignment:  Normal

Vertebrae:  Normal

Conus medullaris and cauda equina: Conus extends to the L1 level.
Conus and cauda equina appear normal.

Paraspinal and other soft tissues: 5 x 6 cm cyst medial to the right
kidney . This is incompletely evaluated. Consider follow-up renal
ultrasound to exclude hydronephrosis. Renal calices appear to be
dilated however the right ureter is nondilated.

Disc levels:

L1-2:  Negative

L2-3:  Small left paracentral disc protrusion without stenosis.

L3-4: Small foraminal and extraforaminal disc protrusion on the
right. Mild right foraminal narrowing without neural compression.
Mild facet degeneration without significant stenosis

L4-5: Small right-sided disc protrusion. Subarticular stenosis on
the right could cause impingement of the right L5 nerve root. Spinal
canal normal in size

L5-S1: Negative
IMPRESSION: Small left paracentral disc protrusion L2-3 without stenosis

Small right-sided disc protrusion L3-4 with mild right foraminal
narrowing.

Small right-sided disc protrusion L4-5 with subarticular stenosis
and possible impingement right L5 nerve root.

Large cystic structure medial to the right kidney, possibly
hydronephrosis. Recommend follow-up renal ultrasound.

## 2019-04-29 DIAGNOSIS — F4323 Adjustment disorder with mixed anxiety and depressed mood: Secondary | ICD-10-CM | POA: Diagnosis not present

## 2019-05-10 MED FILL — metFORMIN HCL 500 MG TABS: 500 | 90 days supply | Qty: 180 | Fill #1

## 2019-05-10 MED FILL — LISINOPRIL 10 MG TABS: 10 | 90 days supply | Qty: 90 | Fill #1

## 2019-05-13 DIAGNOSIS — F4323 Adjustment disorder with mixed anxiety and depressed mood: Secondary | ICD-10-CM | POA: Diagnosis not present

## 2019-05-20 DIAGNOSIS — F4323 Adjustment disorder with mixed anxiety and depressed mood: Secondary | ICD-10-CM | POA: Diagnosis not present

## 2019-06-03 DIAGNOSIS — F4323 Adjustment disorder with mixed anxiety and depressed mood: Secondary | ICD-10-CM | POA: Diagnosis not present

## 2019-06-06 ENCOUNTER — Encounter: Payer: Self-pay | Admitting: Family Medicine

## 2019-06-09 MED FILL — ATORVASTATIN 40 MG TABLET: 40 | 90 days supply | Qty: 90 | Fill #2

## 2019-06-10 DIAGNOSIS — F4323 Adjustment disorder with mixed anxiety and depressed mood: Secondary | ICD-10-CM | POA: Diagnosis not present

## 2019-06-27 MED FILL — PHENobarbital 97.2 MG TABS: 97.2 | 90 days supply | Qty: 180 | Fill #1

## 2019-07-16 MED FILL — HYDROCHLOROTHIAZIDE 25 MG T: 25 | 90 days supply | Qty: 90 | Fill #1

## 2019-07-17 DIAGNOSIS — F4323 Adjustment disorder with mixed anxiety and depressed mood: Secondary | ICD-10-CM | POA: Diagnosis not present

## 2019-07-30 DIAGNOSIS — F4323 Adjustment disorder with mixed anxiety and depressed mood: Secondary | ICD-10-CM | POA: Diagnosis not present

## 2019-08-04 MED FILL — LISINOPRIL 10 MG TABS: 10 | 90 days supply | Qty: 90 | Fill #2

## 2019-08-13 DIAGNOSIS — F4323 Adjustment disorder with mixed anxiety and depressed mood: Secondary | ICD-10-CM | POA: Diagnosis not present

## 2019-08-21 DIAGNOSIS — F4323 Adjustment disorder with mixed anxiety and depressed mood: Secondary | ICD-10-CM | POA: Diagnosis not present

## 2019-09-01 ENCOUNTER — Encounter: Payer: Self-pay | Admitting: Family Medicine

## 2019-09-01 ENCOUNTER — Other Ambulatory Visit: Payer: Self-pay

## 2019-09-01 ENCOUNTER — Ambulatory Visit: Payer: 59 | Admitting: Family Medicine

## 2019-09-01 VITALS — BP 124/60 | HR 94 | Temp 98.3°F

## 2019-09-01 DIAGNOSIS — H6123 Impacted cerumen, bilateral: Secondary | ICD-10-CM

## 2019-09-01 NOTE — Progress Notes (Signed)
   Subjective:    Patient ID: Lauren Espinoza, female    DOB: [birth year], 49 y.o.   MRN: [id number]  HPI Here for wax build up in both ears. The left ear is mildly painful. She has tried at home removal kits with no luck.    Review of Systems  Constitutional: Negative.   HENT: Positive for ear pain and hearing loss.   Eyes: Negative.   Respiratory: Negative.        Objective:   Physical Exam Constitutional:      Appearance: Normal appearance.  HENT:     Right Ear: There is impacted cerumen.     Left Ear: There is impacted cerumen.     Nose: Nose normal.     Mouth/Throat:     Pharynx: Oropharynx is clear.  Eyes:     Conjunctiva/sclera: Conjunctivae normal.  Pulmonary:     Effort: Pulmonary effort is normal.     Breath sounds: Normal breath sounds.  Lymphadenopathy:     Cervical: No cervical adenopathy.  Neurological:     Mental Status: She is alert.           Assessment & Plan:  Cerumen impactions. We tried unsuccessfully to clear the canals with water. We will refer her to ENT for this.  Gershon Crane, MD

## 2019-09-03 DIAGNOSIS — H6123 Impacted cerumen, bilateral: Secondary | ICD-10-CM | POA: Diagnosis not present

## 2019-09-04 MED FILL — ATORVASTATIN CALCIUM 40 MG: 40 | 90 days supply | Qty: 90 | Fill #3

## 2019-09-25 ENCOUNTER — Encounter: Payer: Self-pay | Admitting: Family Medicine

## 2019-09-26 ENCOUNTER — Other Ambulatory Visit: Payer: Self-pay | Admitting: Family Medicine

## 2019-09-26 NOTE — Telephone Encounter (Signed)
FYI

## 2019-09-29 MED ORDER — PHENOBARBITAL 97.2 MG PO TABS: 97.2000 mg | ORAL_TABLET | Freq: Two times a day (BID) | ORAL | 1 refills | Status: DC

## 2019-09-29 MED FILL — PHENobarbital 97.2 MG TABS: 97.2 | 90 days supply | Qty: 180 | Fill #0

## 2019-09-29 NOTE — Telephone Encounter (Signed)
Last filled 03/27/2019 Last OV 09/01/2019  Ok to fill?

## 2019-09-29 NOTE — Telephone Encounter (Signed)
Left a detailed message on verified voice mail asking the patient to call back and confirm the pharmacy.

## 2019-09-29 NOTE — Telephone Encounter (Signed)
Should this go to [facility] pharmacy or [facility]?

## 2019-09-29 NOTE — Telephone Encounter (Signed)
I sent these to [facility]

## 2019-09-29 NOTE — Telephone Encounter (Signed)
Pt is calling in stating that she is out of her phenobarbital (LUMINAL) 97.2 MG   Pharm: [facility]  Pt is aware that it can take up to 24-72 hours for Rx refill and the request had been sent to the provider.

## 2019-10-01 ENCOUNTER — Ambulatory Visit: Payer: 59 | Attending: Internal Medicine

## 2019-10-01 DIAGNOSIS — Z20822 Contact with and (suspected) exposure to covid-19: Secondary | ICD-10-CM

## 2019-10-02 LAB — SARS-COV-2, NAA 2 DAY TAT

## 2019-10-02 LAB — NOVEL CORONAVIRUS, NAA: SARS-CoV-2, NAA: NOT DETECTED

## 2019-10-15 ENCOUNTER — Telehealth: Payer: 59 | Admitting: Family

## 2019-10-15 DIAGNOSIS — R109 Unspecified abdominal pain: Secondary | ICD-10-CM

## 2019-10-15 NOTE — Progress Notes (Signed)
Based on what you shared with me, I feel your condition warrants further evaluation and I recommend that you be seen for a face to face office visit.  Given your symptoms of back pain with UTI symptoms you need to be seen face to face to rule out a more serious infection.    NOTE: If you entered your credit card information for this eVisit, you will not be charged. You may see a "hold" on your card for the $35 but that hold will drop off and you will not have a charge processed.   If you are having a true medical emergency please call 911.      For an urgent face to face visit, [place] has five urgent care centers for your convenience:      NEW:  [facility] Center at [facility] Directions [phone] [address] [place], [place] [address] . 10 am - 6pm Monday - Friday    [facility] Urgent Care Center [facility]) Get Driving Directions [phone] [address] [place], [place] [address] . 10 am to 8 pm Monday-Friday . 12 pm to 8 pm [facility]gent Care at [facility] Get Driving Directions [phone] [address], [address] [place], [place] [address] . 8 am to 8 pm Monday-Friday . 9 am to 6 pm Saturday . 11 am to 6 pm Sunday     [facility] Health Urgent Care at [facility] Get Driving Directions  [phone] [address] [place], [place] [address] . 8 am to 8 pm Monday-Friday . 8 am to 4 pm [facility] at [facility] Directions [phone] [address] [place], [place] [address] . 12 pm to 6 pm Monday-Friday      Your e-visit answers were reviewed by a board certified advanced clinical practitioner to complete your personal care plan.  Thank you for using e-Visits.

## 2019-10-17 MED FILL — HYDROCHLOROTHIAZIDE 25 MG T: 25 | 90 days supply | Qty: 90 | Fill #2

## 2019-10-21 MED FILL — METFORMIN HCL 500 MG TABS: 500 | 90 days supply | Qty: 180 | Fill #3

## 2019-10-21 MED FILL — LISINOPRIL 10 MG TABS: 10 | 90 days supply | Qty: 90 | Fill #3

## 2019-11-11 ENCOUNTER — Other Ambulatory Visit: Payer: Self-pay

## 2019-11-11 ENCOUNTER — Encounter: Payer: Self-pay | Admitting: Family Medicine

## 2019-11-11 ENCOUNTER — Other Ambulatory Visit: Payer: Self-pay | Admitting: Family Medicine

## 2019-11-11 ENCOUNTER — Ambulatory Visit (INDEPENDENT_AMBULATORY_CARE_PROVIDER_SITE_OTHER): Payer: 59 | Admitting: Family Medicine

## 2019-11-11 VITALS — BP 124/62 | HR 91 | Temp 98.2°F

## 2019-11-11 DIAGNOSIS — E119 Type 2 diabetes mellitus without complications: Secondary | ICD-10-CM

## 2019-11-11 DIAGNOSIS — R569 Unspecified convulsions: Secondary | ICD-10-CM | POA: Diagnosis not present

## 2019-11-11 DIAGNOSIS — Z0000 Encounter for general adult medical examination without abnormal findings: Secondary | ICD-10-CM

## 2019-11-11 MED ORDER — METFORMIN HCL 500 MG PO TABS: 500.0000 mg | ORAL_TABLET | Freq: Two times a day (BID) | ORAL | 3 refills | Status: DC

## 2019-11-11 MED ORDER — FUROSEMIDE 20 MG PO TABS
ORAL_TABLET | ORAL | 3 refills | Status: DC
Start: 2019-11-11 — End: 2020-11-11

## 2019-11-11 MED ORDER — ATORVASTATIN CALCIUM 40 MG PO TABS: 40.0000 mg | ORAL_TABLET | Freq: Every day | ORAL | 3 refills | Status: DC

## 2019-11-11 MED ORDER — DIAZEPAM 5 MG PO TABS: 5.0000 mg | ORAL_TABLET | Freq: Two times a day (BID) | ORAL | 1 refills | Status: DC | PRN

## 2019-11-11 MED ORDER — HYDROCHLOROTHIAZIDE 25 MG PO TABS: 25.0000 mg | ORAL_TABLET | Freq: Every day | ORAL | 3 refills | Status: DC

## 2019-11-11 MED ORDER — LISINOPRIL 10 MG PO TABS: 10.0000 mg | ORAL_TABLET | Freq: Every day | ORAL | 3 refills | Status: DC

## 2019-11-11 MED FILL — FUROSEMIDE 20 MG TABS: 20 | 90 days supply | Qty: 90 | Fill #0

## 2019-11-11 MED FILL — diazePAM 5 MG TABS: 5 | 90 days supply | Qty: 180 | Fill #0

## 2019-11-11 NOTE — Progress Notes (Signed)
   Subjective:    Patient ID: Lauren Espinoza, female    DOB: [birth year], 49 y.o.   MRN: [id number]  HPI Here for a well exam. She feels well. She has not had a seizure in years. Her am fasting glucoses run 100-120.   Review of Systems  Constitutional: Negative.   HENT: Negative.   Eyes: Negative.   Respiratory: Negative.   Cardiovascular: Negative.   Gastrointestinal: Negative.   Genitourinary: Negative for decreased urine volume, difficulty urinating, dyspareunia, dysuria, enuresis, flank pain, frequency, hematuria, pelvic pain and urgency.  Musculoskeletal: Negative.   Skin: Negative.   Neurological: Negative.   Psychiatric/Behavioral: Negative.        Objective:   Physical Exam Constitutional:      General: She is not in acute distress.    Appearance: She is well-developed. She is obese.  HENT:     Head: Normocephalic and atraumatic.     Right Ear: External ear normal.     Left Ear: External ear normal.     Nose: Nose normal.     Mouth/Throat:     Pharynx: No oropharyngeal exudate.  Eyes:     General: No scleral icterus.    Conjunctiva/sclera: Conjunctivae normal.     Pupils: Pupils are equal, round, and reactive to light.  Neck:     Thyroid: No thyromegaly.     Vascular: No JVD.  Cardiovascular:     Rate and Rhythm: Normal rate and regular rhythm.     Heart sounds: Normal heart sounds. No murmur heard.  No friction rub. No gallop.   Pulmonary:     Effort: Pulmonary effort is normal. No respiratory distress.     Breath sounds: Normal breath sounds. No wheezing or rales.  Chest:     Chest wall: No tenderness.  Abdominal:     General: Bowel sounds are normal. There is no distension.     Palpations: Abdomen is soft. There is no mass.     Tenderness: There is no abdominal tenderness. There is no guarding or rebound.  Musculoskeletal:        General: No tenderness. Normal range of motion.     Cervical back: Normal range of motion and neck supple.  Lymphadenopathy:      Cervical: No cervical adenopathy.  Skin:    General: Skin is warm and dry.     Findings: No erythema or rash.  Neurological:     Mental Status: She is alert and oriented to person, place, and time.     Cranial Nerves: No cranial nerve deficit.     Motor: No abnormal muscle tone.     Coordination: Coordination normal.     Deep Tendon Reflexes: Reflexes are normal and symmetric. Reflexes normal.  Psychiatric:        Behavior: Behavior normal.        Thought Content: Thought content normal.        Judgment: Judgment normal.           Assessment & Plan:  Well exam. We discussed diet and exercise. Get fasting labs. Gershon Crane, MD

## 2019-11-12 ENCOUNTER — Encounter: Payer: Self-pay | Admitting: Family Medicine

## 2019-11-12 NOTE — Telephone Encounter (Signed)
As a matter of fact, I did start her on a thyroid medication. See my Result Note for recent labs

## 2019-11-13 ENCOUNTER — Other Ambulatory Visit: Payer: Self-pay

## 2019-11-13 DIAGNOSIS — E039 Hypothyroidism, unspecified: Secondary | ICD-10-CM

## 2019-11-13 LAB — CBC WITH DIFFERENTIAL/PLATELET
Absolute Monocytes: 523 cells/uL (ref 200–950)
Basophils Absolute: 67 cells/uL (ref 0–200)
Basophils Relative: 1 %
Eosinophils Absolute: 188 cells/uL (ref 15–500)
Eosinophils Relative: 2.8 %
HCT: 40.2 % (ref 35.0–45.0)
Hemoglobin: 13.4 g/dL (ref 11.7–15.5)
Lymphs Abs: 2070 cells/uL (ref 850–3900)
MCH: 31.3 pg (ref 27.0–33.0)
MCHC: 33.3 g/dL (ref 32.0–36.0)
MCV: 93.9 fL (ref 80.0–100.0)
MPV: 10.6 fL (ref 7.5–12.5)
Monocytes Relative: 7.8 %
Neutro Abs: 3853 cells/uL (ref 1500–7800)
Neutrophils Relative %: 57.5 %
Platelets: 288 10*3/uL (ref 140–400)
RBC: 4.28 10*6/uL (ref 3.80–5.10)
RDW: 12 % (ref 11.0–15.0)
Total Lymphocyte: 30.9 %
WBC: 6.7 10*3/uL (ref 3.8–10.8)

## 2019-11-13 LAB — LIPID PANEL
Cholesterol: 163 mg/dL (ref <200)
HDL: 47 mg/dL — ABNORMAL LOW (ref >=50)
LDL Cholesterol (Calc): 88 mg/dL (calc)
Non-HDL Cholesterol (Calc): 116 mg/dL (calc) (ref <130)
Total CHOL/HDL Ratio: 3.5 (calc) (ref <5.0)
Triglycerides: 189 mg/dL — ABNORMAL HIGH (ref <150)

## 2019-11-13 LAB — BASIC METABOLIC PANEL
BUN: 12 mg/dL (ref 7–25)
CO2: 26 mmol/L (ref 20–32)
Calcium: 9.1 mg/dL (ref 8.6–10.2)
Chloride: 103 mmol/L (ref 98–110)
Creat: 0.58 mg/dL (ref 0.50–1.10)
Glucose, Bld: 103 mg/dL — ABNORMAL HIGH (ref 65–99)
Potassium: 3.9 mmol/L (ref 3.5–5.3)
Sodium: 138 mmol/L (ref 135–146)

## 2019-11-13 LAB — HEPATIC FUNCTION PANEL
AG Ratio: 2 (calc) (ref 1.0–2.5)
ALT: 19 U/L (ref 6–29)
AST: 14 U/L (ref 10–35)
Albumin: 4.1 g/dL (ref 3.6–5.1)
Alkaline phosphatase (APISO): 34 U/L (ref 31–125)
Bilirubin, Direct: 0.1 mg/dL (ref 0.0–0.2)
Globulin: 2.1 g/dL (calc) (ref 1.9–3.7)
Indirect Bilirubin: 0.2 mg/dL (calc) (ref 0.2–1.2)
Total Bilirubin: 0.3 mg/dL (ref 0.2–1.2)
Total Protein: 6.2 g/dL (ref 6.1–8.1)

## 2019-11-13 LAB — TSH: TSH: 4.83 mIU/L — ABNORMAL HIGH

## 2019-11-13 LAB — HEMOGLOBIN A1C
Hgb A1c MFr Bld: 5.8 % of total Hgb — ABNORMAL HIGH (ref <5.7)
Mean Plasma Glucose: 120 (calc)
eAG (mmol/L): 6.6 (calc)

## 2019-11-13 LAB — PHENOBARBITAL LEVEL: Phenobarbital, Serum: 21.8 mg/L (ref 15.0–40.0)

## 2019-11-13 MED ORDER — LEVOTHYROXINE SODIUM 75 MCG PO TABS: 75.0000 ug | ORAL_TABLET | Freq: Every day | ORAL | 3 refills | Status: DC

## 2019-11-13 MED FILL — LEVOTHYROXINE 75 MCG TABLET: 75 | 90 days supply | Qty: 90 | Fill #0

## 2019-11-17 MED FILL — ATORVASTATIN CALCIUM 40 MG: 40 | 90 days supply | Qty: 90 | Fill #0

## 2019-12-08 ENCOUNTER — Encounter: Payer: Self-pay | Admitting: Family Medicine

## 2019-12-15 NOTE — Telephone Encounter (Signed)
I know it is confusing listening to the news. Our GI doctors still want to wait until age 49 based on the data

## 2019-12-30 MED FILL — PHENobarbital 97.2 MG TABS: 97.2 | 90 days supply | Qty: 180 | Fill #1

## 2019-12-30 MED FILL — HYDROCHLOROTHIAZIDE 25 MG T: 25 | 90 days supply | Qty: 90 | Fill #0

## 2020-01-01 MED FILL — METFORMIN HCL 500 MG TABS: 500 | 90 days supply | Qty: 180 | Fill #0

## 2020-01-08 ENCOUNTER — Telehealth: Payer: 59 | Admitting: Emergency Medicine

## 2020-01-08 DIAGNOSIS — J029 Acute pharyngitis, unspecified: Secondary | ICD-10-CM | POA: Diagnosis not present

## 2020-01-08 MED ORDER — AMOXICILLIN 500 MG PO CAPS: 500.0000 mg | ORAL_CAPSULE | Freq: Two times a day (BID) | ORAL | 0 refills | Status: AC

## 2020-01-08 MED FILL — LISINOPRIL 10 MG TABS: 10 | 90 days supply | Qty: 90 | Fill #0

## 2020-01-08 NOTE — Progress Notes (Signed)
We are sorry that you are not feeling well.  Here is how we plan to help!  Based on what you have shared with me it is likely that you have strep pharyngitis.  Strep pharyngitis is inflammation and infection in the back of the throat.  This is an infection cause by bacteria and is treated with antibiotics.  I have prescribed Amoxicillin 500 mg twice a day for 10 days. For throat pain, we recommend over the counter oral pain relief medications such as acetaminophen or aspirin, or anti-inflammatory medications such as ibuprofen or naproxen sodium. Topical treatments such as oral throat lozenges or sprays may be used as needed. Strep infections are not as easily transmitted as other respiratory infections, however we still recommend that you avoid close contact with loved ones, especially the very young and elderly.  Remember to wash your hands thoroughly throughout the day as this is the number one way to prevent the spread of infection and wipe down door knobs and counters with disinfectant.   Home Care:  Only take medications as instructed by your medical team.  Complete the entire course of an antibiotic.  Do not take these medications with alcohol.  A steam or ultrasonic humidifier can help congestion.  You can place a towel over your head and breathe in the steam from hot water coming from a faucet.  Avoid close contacts especially the very young and the elderly.  Cover your mouth when you cough or sneeze.  Always remember to wash your hands.  Get Help Right Away If:  You develop worsening fever or sinus pain.  You develop a severe head ache or visual changes.  Your symptoms persist after you have completed your treatment plan.  Make sure you  Understand these instructions.  Will watch your condition.  Will get help right away if you are not doing well or get worse.  Your e-visit answers were reviewed by a board certified advanced clinical practitioner to complete your  personal care plan.  Depending on the condition, your plan could have included both over the counter or prescription medications.  If there is a problem please reply  once you have received a response from your provider.  Your safety is important to [place].  If you have drug allergies check your prescription carefully.    You can use MyChart to ask questions about todays visit, request a non-urgent call back, or ask for a work or school excuse for 24 hours related to this e-Visit. If it has been greater than 24 hours you will need to follow up with your provider, or enter a new e-Visit to address those concerns.  You will get an e-mail in the next two days asking about your experience.  I hope that your e-visit has been valuable and will speed your recovery. Thank you for using e-visits.  I spent 5-10 minutes reviewing patient's medical history and chart.

## 2020-01-26 MED FILL — LISINOPRIL 10 MG TABS: 10 | 90 days supply | Qty: 90 | Fill #0

## 2020-01-26 MED FILL — LEVOTHYROXINE 75 MCG TABLET: 75 | 90 days supply | Qty: 90 | Fill #0

## 2020-02-19 ENCOUNTER — Other Ambulatory Visit (HOSPITAL_COMMUNITY): Payer: Self-pay | Admitting: Obstetrics and Gynecology

## 2020-02-19 DIAGNOSIS — Z01419 Encounter for gynecological examination (general) (routine) without abnormal findings: Secondary | ICD-10-CM | POA: Diagnosis not present

## 2020-02-19 DIAGNOSIS — Z1231 Encounter for screening mammogram for malignant neoplasm of breast: Secondary | ICD-10-CM | POA: Diagnosis not present

## 2020-02-19 MED FILL — KETOCONAZOLE 2 % CREA: 2 | 30 days supply | Qty: 120 | Fill #0

## 2020-02-26 DIAGNOSIS — Z1211 Encounter for screening for malignant neoplasm of colon: Secondary | ICD-10-CM | POA: Diagnosis not present

## 2020-02-26 DIAGNOSIS — Z800 Family history of malignant neoplasm of digestive organs: Secondary | ICD-10-CM | POA: Diagnosis not present

## 2020-02-26 DIAGNOSIS — K5900 Constipation, unspecified: Secondary | ICD-10-CM | POA: Diagnosis not present

## 2020-03-03 DIAGNOSIS — Z1211 Encounter for screening for malignant neoplasm of colon: Secondary | ICD-10-CM | POA: Diagnosis not present

## 2020-03-03 HISTORY — PX: COLONOSCOPY: SHX174

## 2020-03-09 DIAGNOSIS — H35033 Hypertensive retinopathy, bilateral: Secondary | ICD-10-CM | POA: Diagnosis not present

## 2020-03-09 DIAGNOSIS — H524 Presbyopia: Secondary | ICD-10-CM | POA: Diagnosis not present

## 2020-03-09 DIAGNOSIS — I10 Essential (primary) hypertension: Secondary | ICD-10-CM | POA: Diagnosis not present

## 2020-03-09 DIAGNOSIS — H52223 Regular astigmatism, bilateral: Secondary | ICD-10-CM | POA: Diagnosis not present

## 2020-03-09 DIAGNOSIS — H5203 Hypermetropia, bilateral: Secondary | ICD-10-CM | POA: Diagnosis not present

## 2020-03-09 DIAGNOSIS — E119 Type 2 diabetes mellitus without complications: Secondary | ICD-10-CM | POA: Diagnosis not present

## 2020-03-09 LAB — HM DIABETES EYE EXAM

## 2020-03-09 MED FILL — ATORVASTATIN 40 MG TABLET: 40 | 90 days supply | Qty: 90 | Fill #1

## 2020-03-22 ENCOUNTER — Encounter: Payer: Self-pay | Admitting: Family Medicine

## 2020-03-30 ENCOUNTER — Other Ambulatory Visit: Payer: Self-pay | Admitting: Family Medicine

## 2020-03-30 MED FILL — HYDROCHLOROTHIAZIDE 25 MG T: 25 | 90 days supply | Qty: 90 | Fill #1

## 2020-03-30 MED FILL — METFORMIN HCL 500 MG TABS: 500 | 90 days supply | Qty: 180 | Fill #1

## 2020-03-30 NOTE — Telephone Encounter (Signed)
Last Refill:09/29/19# 180/1 refill Last OV: 11/11/19

## 2020-03-31 ENCOUNTER — Other Ambulatory Visit: Payer: Self-pay | Admitting: Family Medicine

## 2020-03-31 MED FILL — PHENobarbital 97.2 MG TABS: 97.2 | 90 days supply | Qty: 180 | Fill #0

## 2020-04-08 ENCOUNTER — Telehealth: Payer: BC Managed Care – PPO | Admitting: Family Medicine

## 2020-04-08 ENCOUNTER — Encounter: Payer: Self-pay | Admitting: Family Medicine

## 2020-04-08 DIAGNOSIS — J0140 Acute pansinusitis, unspecified: Secondary | ICD-10-CM

## 2020-04-08 MED ORDER — FLUTICASONE PROPIONATE 50 MCG/ACT NA SUSP: 2.0000 | Freq: Every day | NASAL | 0 refills | Status: DC

## 2020-04-08 MED ORDER — AZITHROMYCIN 250 MG PO TABS: ORAL_TABLET | ORAL | 0 refills | Status: DC

## 2020-04-08 NOTE — Progress Notes (Addendum)
Ms. Lauren, Espinoza are scheduled for a virtual visit with your provider today.    Just as we do with appointments in the office, we must obtain your consent to participate.  Your consent will be active for this visit and any virtual visit you may have with one of our providers in the next 365 days.    If you have a MyChart account, I can also send a copy of this consent to you electronically.  All virtual visits are billed to your insurance company just like a traditional visit in the office.  As this is a virtual visit, video technology does not allow for your provider to perform a traditional examination.  This may limit your provider's ability to fully assess your condition.  If your provider identifies any concerns that need to be evaluated in person or the need to arrange testing such as labs, EKG, etc, we will make arrangements to do so.    Although advances in technology are sophisticated, we cannot ensure that it will always work on either your end or our end.  If the connection with a video visit is poor, we may have to switch to a telephone visit.  With either a video or telephone visit, we are not always able to ensure that we have a secure connection.   I need to obtain your verbal consent now.   Are you willing to proceed with your visit today?   Lauren Espinoza has provided verbal consent on 04/08/2020 for a virtual visit (video or telephone).   Lauren Mayo, NP 04/08/2020  8:52 AM    04/08/20   Subjective:     Lauren Espinoza is a 50 y.o. female who presents for possible sinus infection via caregility video telehealth. Onset was 5 days ago and symptoms have been persistent and include:     Symptoms include nasal drainage, pressure, facial pain and pressure, headache.  Denies having fevers, chills, shortness of breath, cough, chest pain, ear pain or exposure to covid or other sick contacts. She reports she gets this usually this time of the year.  Modifying factors include: OTC treatments w/o  relief. No other aggravating or relieving factors.  No other c/o.  ROS as in subjective  Review of Systems  HENT: Positive for congestion and sinus pain.   Neurological: Positive for headaches.     Objective: There were no vitals filed for this visit.  General appearance: Alert, WD/WN, no distress                             Skin: warm, no rash                           Head: +  sinus tenderness- when asked to palpitate her forehead and cheeks.                            Eyes: conjunctiva normal, corneas clear, PERRLA                                       Nose: nasal tone noted in conversation, and stuffyness                   There is no noted cough or shortness of breath in conversation  Assessment and Plan:  1. Acute non-recurrent pansinusitis - azithromycin (ZITHROMAX) 250 MG tablet; Take 2 tablets (500 mg) on the first day. Then take 1 tablet (225m) on days 2-5.  Dispense: 6 tablet; Refill: 0 - fluticasone (FLONASE) 50 MCG/ACT nasal spray; Place 2 sprays into both nostrils daily.  Dispense: 16 g; Refill: 0   Discussed diagnosis and treatment of sinusitis.  Prescription given for zpak. Her symptoms overlap COVID and she is recommended to get a COVID test as soon as possible. She is reviewed the symptoms which she will need to be seen in ED or UC.  Suggested symptomatic OTC remedies as needed. Nasal saline spray for congestion and dry nasal passages. Tylenol or Ibuprofen OTC for fever and malaise.  Consider sinus rinses (Neti-pot or Sinus Rinse Kit). Discussed use of Mucinex if needed for congestion. Warm fluids to soothe any throat discomfort. Rest and Fresh air daily.     Time spent in non face to face time reviewing chart, above Dx, and orders was 10 mins.  HPerlie Mayo DNP, AGNP-BC [place] [id number] Hours: Mon-Thurs 8 am-5 pm; Fri 8 am-12 pm Office Phone:   [phone] Office Fax: [phone]

## 2020-04-08 NOTE — Patient Instructions (Signed)
  I appreciate the opportunity to provide you with care for your health and wellness.  Follow up: as scheduled/needed with your PCP  No labs or referrals today  Your signs and symptoms are consistent with a sinus infection, however they do overlap a some with the new Omicron variant. Therefore it is recommend that you get COVID testing.  I have sent in an antibiotic given you are moving into a week of having symptoms. Please take the prescribed medication as directed and complete the full dose to prevent reinfection.  Please eat yogurt or take a probiotic if possible to avoid a yeast infection (this is common) from antibiotic. Should you develop a infection call the office or send a message in MyChart to let us know. We will address it quickly.  As suggested for symptom management use Nasal saline spray to help ease congestion and dry nasal passages. Can be used throughout the day as needed. Avoid forceful blowing of nose. It will be common to have some blood if blowing nose a lot or if nose is very dry. Can you a humidifier at home as well. Remember to wash and air it out regularly. Tylenol (325 mg 2 tablets every 6 hours) and or Ibuprofen (200- 400 mg every 8 hours) for fever, sore throat and body aches. Can consider use of a Neti-pot to wash out sinus cavity (twice daily). Please be aware that you need to use distilled or boiled water for these. If congestion is increasing and or coughing can use Mucinex (twice daily) for cough and congestion with full glass of water.  If you have a sore throat warm fluids like hot tea or lemon water can help sooth this.   Please hydrate, rest, get fresh air daily and wash your hands well.  Please continue to practice social distancing to keep you, your family, and our community safe.  If you must go out, please wear a mask and practice good handwashing.  It was a pleasure to see you and I look forward to continuing to work together on your health and  well-being. Please do not hesitate to call the office if you need care or have questions about your care.  Have a wonderful day. With Gratitude, Tereasa Coop, DNP, AGNP-BC

## 2020-04-11 ENCOUNTER — Encounter: Payer: Self-pay | Admitting: Family Medicine

## 2020-04-12 ENCOUNTER — Other Ambulatory Visit: Payer: BC Managed Care – PPO

## 2020-04-12 NOTE — Telephone Encounter (Signed)
Unfortunately the antibody infusion medication is in very short supply and they are only giving it to high risk patients, therefore she would not qualify for this. The family needs to quarantine at home for 10 days. Drink liquids, recheck as needed

## 2020-04-13 NOTE — Addendum Note (Signed)
Addended by: Elinora Weigand M on: 04/13/2020 01:28 PM   Modules accepted: Level of Service  

## 2020-05-03 MED FILL — KETOCONAZOLE 2 % CREA: 2 | 30 days supply | Qty: 120 | Fill #1

## 2020-05-03 MED FILL — LISINOPRIL 10 MG TABS: 10 | 90 days supply | Qty: 90 | Fill #1

## 2020-05-03 MED FILL — LEVOTHYROXINE 75 MCG TABLET: 75 | 90 days supply | Qty: 90 | Fill #1

## 2020-06-07 ENCOUNTER — Other Ambulatory Visit (HOSPITAL_BASED_OUTPATIENT_CLINIC_OR_DEPARTMENT_OTHER): Payer: Self-pay

## 2020-06-07 MED FILL — HYDROCHLOROTHIAZIDE 25 MG T: 25 | 90 days supply | Qty: 90 | Fill #2

## 2020-06-07 MED FILL — ATORVASTATIN 40 MG TABLET: 40 | 90 days supply | Qty: 90 | Fill #2

## 2020-06-11 ENCOUNTER — Other Ambulatory Visit (HOSPITAL_COMMUNITY): Payer: Self-pay | Admitting: Obstetrics and Gynecology

## 2020-06-14 ENCOUNTER — Telehealth: Payer: BC Managed Care – PPO | Admitting: Family Medicine

## 2020-06-14 ENCOUNTER — Encounter: Payer: Self-pay | Admitting: Family Medicine

## 2020-06-14 VITALS — Temp 99.9°F | Wt 275.0 lb

## 2020-06-14 DIAGNOSIS — J0191 Acute recurrent sinusitis, unspecified: Secondary | ICD-10-CM | POA: Diagnosis not present

## 2020-06-14 MED ORDER — AMOXICILLIN-POT CLAVULANATE 875-125 MG PO TABS: 1.0000 | ORAL_TABLET | Freq: Two times a day (BID) | ORAL | 0 refills | Status: DC

## 2020-06-14 NOTE — Progress Notes (Signed)
Subjective:    Patient ID: Lauren Espinoza, female    DOB: Aug 08, 1970, 50 y.o.   MRN: [id number]  HPI Virtual Visit via Video Note  I connected with the patient on 06/14/20 at  2:15 PM EDT by a video enabled telemedicine application and verified that I am speaking with the correct person using two identifiers.  Location patient: home Location provider:work or home office Persons participating in the virtual visit: patient, provider  I discussed the limitations of evaluation and management by telemedicine and the availability of in person appointments. The patient expressed understanding and agreed to proceed.   HPI: Here for what she thinks is a sinus infection. For the past 4 days she has had sinus congestion, PND, blowing green mucus from the nose, and a dry cough. No SOB or fever. No body aches or NVD. Taking Sudafed. She was treated with a Zpack for similar symptoms in January.    ROS: See pertinent positives and negatives per HPI.  Past Medical History:  Diagnosis Date  . BACK PAIN, UPPER 09/03/2008  . Diabetes mellitus   . DVT, HX OF 04/20/2008  . Edema   . Hyperlipidemia   . HYPERTENSION 04/20/2008  . Left shoulder pain    sees Dr Chaney Malling  . SEIZURE DISORDER 04/20/2008   last seizure  1995    Past Surgical History:  Procedure Laterality Date  . CESAREAN SECTION    . CHOLECYSTECTOMY    . ENDOMETRIAL ABLATION  08-09-11   per Dr. Henderson Cloud     Family History  Problem Relation Age of Onset  . Diabetes Father   . Diabetes Maternal Uncle   . Diabetes Paternal Grandmother   . Diabetes Paternal Grandfather      Current Outpatient Medications:  .  aspirin 325 MG tablet, Take 325 mg by mouth daily., Disp: , Rfl:  .  atorvastatin (LIPITOR) 40 MG tablet, Take 1 tablet (40 mg total) by mouth daily., Disp: 90 tablet, Rfl: 3 .  Blood Glucose Monitoring Suppl (FREESTYLE LITE) DEVI, Check blood sugar once daily, Disp: 1 each, Rfl: 0 .  diazepam (VALIUM) 5 MG tablet, Take 1  tablet (5 mg total) by mouth every 12 (twelve) hours as needed for anxiety., Disp: 180 tablet, Rfl: 1 .  furosemide (LASIX) 20 MG tablet, Take one daily as needed for swelling, Disp: 90 tablet, Rfl: 3 .  glucose blood (FREESTYLE LITE) test strip, Use as instructed, Disp: 100 each, Rfl: 6 .  hydrochlorothiazide (HYDRODIURIL) 25 MG tablet, Take 1 tablet (25 mg total) by mouth daily., Disp: 90 tablet, Rfl: 3 .  levothyroxine (SYNTHROID) 75 MCG tablet, Take 1 tablet (75 mcg total) by mouth daily., Disp: 90 tablet, Rfl: 3 .  lisinopril (ZESTRIL) 10 MG tablet, Take 1 tablet (10 mg total) by mouth daily., Disp: 90 tablet, Rfl: 3 .  metFORMIN (GLUCOPHAGE) 500 MG tablet, Take 1 tablet (500 mg total) by mouth 2 (two) times daily with a meal., Disp: 180 tablet, Rfl: 3 .  PHENobarbital (LUMINAL) 97.2 MG tablet, TAKE 1 TABLET BY MOUTH TWICE DAILY, Disp: 180 tablet, Rfl: 1  EXAM:  VITALS per patient if applicable:  GENERAL: alert, oriented, appears well and in no acute distress  HEENT: atraumatic, conjunttiva clear, no obvious abnormalities on inspection of external nose and ears  NECK: normal movements of the head and neck  LUNGS: on inspection no signs of respiratory distress, breathing rate appears normal, no obvious gross SOB, gasping or wheezing  CV: no obvious cyanosis  MS: moves all visible extremities without noticeable abnormality  PSYCH/NEURO: pleasant and cooperative, no obvious depression or anxiety, speech and thought processing grossly intact  ASSESSMENT AND PLAN: Sinusitis, treat with Augmentin. Gershon Crane, MD  Discussed the following assessment and plan:  No diagnosis found.     I discussed the assessment and treatment plan with the patient. The patient was provided an opportunity to ask questions and all were answered. The patient agreed with the plan and demonstrated an understanding of the instructions.   The patient was advised to call back or seek an in-person  evaluation if the symptoms worsen or if the condition fails to improve as anticipated.     Review of Systems     Objective:   Physical Exam        Assessment & Plan:

## 2020-06-29 ENCOUNTER — Encounter: Payer: Self-pay | Admitting: Family Medicine

## 2020-06-29 ENCOUNTER — Other Ambulatory Visit: Payer: Self-pay

## 2020-06-29 ENCOUNTER — Ambulatory Visit: Payer: BC Managed Care – PPO | Admitting: Family Medicine

## 2020-06-29 VITALS — BP 110/78 | HR 97 | Temp 98.0°F

## 2020-06-29 DIAGNOSIS — J019 Acute sinusitis, unspecified: Secondary | ICD-10-CM | POA: Diagnosis not present

## 2020-06-29 MED ORDER — FLUCONAZOLE 150 MG PO TABS: 150.0000 mg | ORAL_TABLET | Freq: Every day | ORAL | 5 refills | Status: DC

## 2020-06-29 MED ORDER — LEVOFLOXACIN 500 MG PO TABS: 500.0000 mg | ORAL_TABLET | Freq: Every day | ORAL | 0 refills | Status: AC

## 2020-06-29 NOTE — Progress Notes (Signed)
   Subjective:    Patient ID: Lauren Espinoza, female    DOB: [birth year], 50 y.o.   MRN: [id number]  HPI Here for continued sinus symptoms. We saw her on 3-38-22 for a sinusitis and treated her with Augmentin. This had very little effect for her. She still has sinus congestion, PND, blowing green mucus from the nose, and a dry cough. No fever. Using Mucinex.    Review of Systems  Constitutional: Negative.   HENT: Positive for congestion, postnasal drip and sinus pressure. Negative for ear pain, facial swelling and sore throat.   Eyes: Negative.   Respiratory: Positive for cough. Negative for wheezing.   Cardiovascular: Negative.   Gastrointestinal: Negative.        Objective:   Physical Exam Constitutional:      Appearance: She is not ill-appearing.  HENT:     Right Ear: Tympanic membrane, ear canal and external ear normal.     Left Ear: Tympanic membrane, ear canal and external ear normal.     Nose: Nose normal.     Mouth/Throat:     Pharynx: Oropharynx is clear.  Eyes:     Conjunctiva/sclera: Conjunctivae normal.  Cardiovascular:     Rate and Rhythm: Normal rate and regular rhythm.     Pulses: Normal pulses.     Heart sounds: Normal heart sounds.  Pulmonary:     Effort: Pulmonary effort is normal.     Breath sounds: Normal breath sounds.  Lymphadenopathy:     Cervical: No cervical adenopathy.  Neurological:     Mental Status: She is alert.           Assessment & Plan:  Sinusitis, treat with Levaquin for 10 days. Recheck as needed.  Gershon Crane, MD

## 2020-07-01 MED FILL — Phenobarbital Tab 97.2 MG: ORAL | 90 days supply | Qty: 180 | Fill #0 | Status: AC

## 2020-07-02 ENCOUNTER — Other Ambulatory Visit (HOSPITAL_COMMUNITY): Payer: Self-pay

## 2020-07-05 ENCOUNTER — Other Ambulatory Visit (HOSPITAL_COMMUNITY): Payer: Self-pay

## 2020-07-06 ENCOUNTER — Other Ambulatory Visit (HOSPITAL_COMMUNITY): Payer: Self-pay

## 2020-07-27 DIAGNOSIS — H6123 Impacted cerumen, bilateral: Secondary | ICD-10-CM | POA: Diagnosis not present

## 2020-08-05 ENCOUNTER — Other Ambulatory Visit (HOSPITAL_COMMUNITY): Payer: Self-pay

## 2020-08-05 MED FILL — Metformin HCl Tab 500 MG: ORAL | 90 days supply | Qty: 180 | Fill #0 | Status: AC

## 2020-08-05 MED FILL — Levothyroxine Sodium Tab 75 MCG: ORAL | 90 days supply | Qty: 90 | Fill #0 | Status: AC

## 2020-08-05 MED FILL — Lisinopril Tab 10 MG: ORAL | 90 days supply | Qty: 90 | Fill #0 | Status: AC

## 2020-09-05 MED FILL — Atorvastatin Calcium Tab 40 MG (Base Equivalent): ORAL | 90 days supply | Qty: 90 | Fill #0 | Status: AC

## 2020-09-06 ENCOUNTER — Encounter: Payer: Self-pay | Admitting: Family Medicine

## 2020-09-06 ENCOUNTER — Ambulatory Visit: Payer: BC Managed Care – PPO | Admitting: Family Medicine

## 2020-09-06 ENCOUNTER — Other Ambulatory Visit: Payer: Self-pay

## 2020-09-06 ENCOUNTER — Other Ambulatory Visit (HOSPITAL_COMMUNITY): Payer: Self-pay

## 2020-09-06 VITALS — BP 102/70 | HR 90 | Temp 98.0°F

## 2020-09-06 DIAGNOSIS — I10 Essential (primary) hypertension: Secondary | ICD-10-CM | POA: Diagnosis not present

## 2020-09-06 DIAGNOSIS — E119 Type 2 diabetes mellitus without complications: Secondary | ICD-10-CM | POA: Diagnosis not present

## 2020-09-06 DIAGNOSIS — R300 Dysuria: Secondary | ICD-10-CM | POA: Diagnosis not present

## 2020-09-06 DIAGNOSIS — E039 Hypothyroidism, unspecified: Secondary | ICD-10-CM | POA: Insufficient documentation

## 2020-09-06 DIAGNOSIS — N390 Urinary tract infection, site not specified: Secondary | ICD-10-CM

## 2020-09-06 DIAGNOSIS — N951 Menopausal and female climacteric states: Secondary | ICD-10-CM | POA: Diagnosis not present

## 2020-09-06 LAB — CBC WITH DIFFERENTIAL/PLATELET
Basophils Absolute: 0.1 10*3/uL (ref 0.0–0.1)
Basophils Relative: 0.6 % (ref 0.0–3.0)
Eosinophils Absolute: 0.4 10*3/uL (ref 0.0–0.7)
Eosinophils Relative: 3.6 % (ref 0.0–5.0)
HCT: 41 % (ref 36.0–46.0)
Hemoglobin: 14 g/dL (ref 12.0–15.0)
Lymphocytes Relative: 17.9 % (ref 12.0–46.0)
Lymphs Abs: 1.8 10*3/uL (ref 0.7–4.0)
MCHC: 34.1 g/dL (ref 30.0–36.0)
MCV: 90.6 fl (ref 78.0–100.0)
Monocytes Absolute: 0.7 10*3/uL (ref 0.1–1.0)
Monocytes Relative: 7.3 % (ref 3.0–12.0)
Neutro Abs: 7.2 10*3/uL (ref 1.4–7.7)
Neutrophils Relative %: 70.6 % (ref 43.0–77.0)
Platelets: 299 10*3/uL (ref 150.0–400.0)
RBC: 4.53 Mil/uL (ref 3.87–5.11)
RDW: 12.7 % (ref 11.5–15.5)
WBC: 10.1 10*3/uL (ref 4.0–10.5)

## 2020-09-06 LAB — LIPID PANEL
Cholesterol: 165 mg/dL (ref 0–200)
HDL: 46.8 mg/dL (ref >39.00)
LDL Cholesterol: 80 mg/dL (ref 0–99)
NonHDL: 118.02
Total CHOL/HDL Ratio: 4
Triglycerides: 189 mg/dL — ABNORMAL HIGH (ref 0.0–149.0)
VLDL: 37.8 mg/dL (ref 0.0–40.0)

## 2020-09-06 LAB — POC URINALSYSI DIPSTICK (AUTOMATED)
Bilirubin, UA: NEGATIVE
Glucose, UA: NEGATIVE
Ketones, UA: NEGATIVE
Nitrite, UA: NEGATIVE
Protein, UA: POSITIVE — AB
Spec Grav, UA: 1.025 (ref 1.010–1.025)
Urobilinogen, UA: 0.2 E.U./dL
pH, UA: 6 (ref 5.0–8.0)

## 2020-09-06 LAB — BASIC METABOLIC PANEL
BUN: 16 mg/dL (ref 6–23)
CO2: 30 mEq/L (ref 19–32)
Calcium: 9.5 mg/dL (ref 8.4–10.5)
Chloride: 101 mEq/L (ref 96–112)
Creatinine, Ser: 0.58 mg/dL (ref 0.40–1.20)
GFR: 107.71 mL/min (ref >60.00)
Glucose, Bld: 110 mg/dL — ABNORMAL HIGH (ref 70–99)
Potassium: 4 mEq/L (ref 3.5–5.1)
Sodium: 142 mEq/L (ref 135–145)

## 2020-09-06 LAB — HEPATIC FUNCTION PANEL
ALT: 23 U/L (ref 0–35)
AST: 17 U/L (ref 0–37)
Albumin: 4.2 g/dL (ref 3.5–5.2)
Alkaline Phosphatase: 37 U/L — ABNORMAL LOW (ref 39–117)
Bilirubin, Direct: 0.1 mg/dL (ref 0.0–0.3)
Total Bilirubin: 0.4 mg/dL (ref 0.2–1.2)
Total Protein: 6.5 g/dL (ref 6.0–8.3)

## 2020-09-06 LAB — HEMOGLOBIN A1C: Hgb A1c MFr Bld: 6.3 % (ref 4.6–6.5)

## 2020-09-06 LAB — TSH: TSH: 2 u[IU]/mL (ref 0.35–4.50)

## 2020-09-06 LAB — T3, FREE: T3, Free: 3.6 pg/mL (ref 2.3–4.2)

## 2020-09-06 LAB — T4, FREE: Free T4: 0.73 ng/dL (ref 0.60–1.60)

## 2020-09-06 MED ORDER — CIPROFLOXACIN HCL 500 MG PO TABS
500.0000 mg | ORAL_TABLET | Freq: Two times a day (BID) | ORAL | 0 refills | Status: DC
  Filled 2020-09-06: qty 14, 7d supply, fill #0

## 2020-09-06 MED ORDER — VENLAFAXINE HCL ER 37.5 MG PO CP24
37.5000 mg | ORAL_CAPSULE | Freq: Every day | ORAL | 2 refills | Status: DC
  Filled 2020-09-06: qty 30, 30d supply, fill #0

## 2020-09-06 NOTE — Progress Notes (Signed)
   Subjective:    Patient ID: Lauren Espinoza, female    DOB: 05/27/1970, 50 y.o.   MRN: [id number]  HPI Here for multiple issues. First she thinks she is perimenopausal because she has been having hot flashes for about 6 months. She takes OTC black cohosh for these, and this helps somewhat. However during this time she has also had a lot of mood swings, with periods of anxiety, depression, and tearfulness. She has some trouble sleeping. Appetite is normal. Lastly for 4 days she has had urinary urgency and burning. No fever. She is drinking water.    Review of Systems  Constitutional: Negative.   Respiratory: Negative.    Cardiovascular: Negative.   Gastrointestinal: Negative.   Genitourinary:  Positive for dysuria, frequency and urgency. Negative for flank pain and hematuria.  Psychiatric/Behavioral:  Positive for decreased concentration, dysphoric mood and sleep disturbance. Negative for agitation, behavioral problems, confusion, self-injury and suicidal ideas. The patient is nervous/anxious.       Objective:   Physical Exam Constitutional:      Appearance: Normal appearance.  Cardiovascular:     Rate and Rhythm: Normal rate and regular rhythm.     Pulses: Normal pulses.     Heart sounds: Normal heart sounds.  Pulmonary:     Effort: Pulmonary effort is normal.     Breath sounds: Normal breath sounds.  Abdominal:     Tenderness: There is no right CVA tenderness or left CVA tenderness.  Neurological:     Mental Status: She is alert.  Psychiatric:        Behavior: Behavior normal.        Thought Content: Thought content normal.        Judgment: Judgment normal.     Comments: Tearful           Assessment & Plan:  She has a UTI and we will treat this with  week of Cipro. Culture the sample. She is also having menopausal symptons, and we will treat the mood swings with Effexor XR 37.5 mg daily. Get fasting labs today. We spent 35 minutes today discussing these issues.   Gershon Crane, MD

## 2020-09-06 NOTE — Telephone Encounter (Signed)
The Effexor is better for perimenopausal symptoms than Zoloft

## 2020-09-09 ENCOUNTER — Encounter: Payer: Self-pay | Admitting: Family Medicine

## 2020-09-09 LAB — URINE CULTURE
MICRO NUMBER:: 12026828
SPECIMEN QUALITY:: ADEQUATE

## 2020-09-30 ENCOUNTER — Other Ambulatory Visit (HOSPITAL_COMMUNITY): Payer: Self-pay

## 2020-09-30 DIAGNOSIS — N959 Unspecified menopausal and perimenopausal disorder: Secondary | ICD-10-CM | POA: Diagnosis not present

## 2020-09-30 MED ORDER — FLUOXETINE HCL 20 MG PO CAPS
ORAL_CAPSULE | ORAL | 4 refills | Status: DC
  Filled 2020-09-30: qty 90, 90d supply, fill #0
  Filled 2020-12-09: qty 90, 90d supply, fill #1
  Filled 2021-03-02: qty 90, 90d supply, fill #2
  Filled 2021-06-20: qty 90, 90d supply, fill #3
  Filled 2021-08-30: qty 90, 90d supply, fill #4

## 2020-10-03 ENCOUNTER — Other Ambulatory Visit: Payer: Self-pay | Admitting: Family Medicine

## 2020-10-04 ENCOUNTER — Other Ambulatory Visit (HOSPITAL_COMMUNITY): Payer: Self-pay

## 2020-10-04 ENCOUNTER — Encounter: Payer: Self-pay | Admitting: Family Medicine

## 2020-10-04 MED ORDER — PHENOBARBITAL 97.2 MG PO TABS
ORAL_TABLET | Freq: Two times a day (BID) | ORAL | 1 refills | Status: DC
  Filled 2020-10-04: qty 180, 90d supply, fill #0
  Filled 2021-01-03: qty 180, 90d supply, fill #1

## 2020-10-04 NOTE — Telephone Encounter (Signed)
Last Ov 09/06/20 Appt. Scheduled for 11/11/20

## 2020-10-04 NOTE — Telephone Encounter (Signed)
I did this earlier today

## 2020-10-05 ENCOUNTER — Other Ambulatory Visit (HOSPITAL_COMMUNITY): Payer: Self-pay

## 2020-10-20 ENCOUNTER — Other Ambulatory Visit: Payer: Self-pay | Admitting: Family Medicine

## 2020-10-20 MED FILL — Hydrochlorothiazide Tab 25 MG: ORAL | 90 days supply | Qty: 90 | Fill #0 | Status: AC

## 2020-10-20 MED FILL — Lisinopril Tab 10 MG: ORAL | 90 days supply | Qty: 90 | Fill #1 | Status: AC

## 2020-10-20 MED FILL — Metformin HCl Tab 500 MG: ORAL | 90 days supply | Qty: 180 | Fill #1 | Status: AC

## 2020-10-21 ENCOUNTER — Other Ambulatory Visit (HOSPITAL_COMMUNITY): Payer: Self-pay

## 2020-10-21 MED ORDER — ATORVASTATIN CALCIUM 40 MG PO TABS
ORAL_TABLET | Freq: Every day | ORAL | 1 refills | Status: DC
  Filled 2020-10-21 – 2020-12-01 (×3): qty 90, 90d supply, fill #0
  Filled 2021-02-09: qty 90, 90d supply, fill #1

## 2020-10-25 ENCOUNTER — Other Ambulatory Visit: Payer: Self-pay | Admitting: Family Medicine

## 2020-10-25 ENCOUNTER — Other Ambulatory Visit (HOSPITAL_COMMUNITY): Payer: Self-pay

## 2020-10-26 ENCOUNTER — Other Ambulatory Visit (HOSPITAL_COMMUNITY): Payer: Self-pay

## 2020-10-26 MED ORDER — LEVOTHYROXINE SODIUM 75 MCG PO TABS
ORAL_TABLET | Freq: Every day | ORAL | 1 refills | Status: DC
  Filled 2020-10-26: qty 90, 90d supply, fill #0
  Filled 2021-02-09: qty 90, 90d supply, fill #1

## 2020-10-27 ENCOUNTER — Other Ambulatory Visit (HOSPITAL_COMMUNITY): Payer: Self-pay

## 2020-11-09 DIAGNOSIS — Z79890 Hormone replacement therapy (postmenopausal): Secondary | ICD-10-CM | POA: Diagnosis not present

## 2020-11-11 ENCOUNTER — Ambulatory Visit (INDEPENDENT_AMBULATORY_CARE_PROVIDER_SITE_OTHER): Payer: BC Managed Care – PPO | Admitting: Family Medicine

## 2020-11-11 ENCOUNTER — Other Ambulatory Visit: Payer: Self-pay

## 2020-11-11 ENCOUNTER — Other Ambulatory Visit (HOSPITAL_COMMUNITY): Payer: Self-pay

## 2020-11-11 ENCOUNTER — Encounter: Payer: Self-pay | Admitting: Family Medicine

## 2020-11-11 VITALS — BP 98/70 | HR 78 | Temp 98.6°F | Ht 70.0 in

## 2020-11-11 DIAGNOSIS — E119 Type 2 diabetes mellitus without complications: Secondary | ICD-10-CM

## 2020-11-11 DIAGNOSIS — Z0000 Encounter for general adult medical examination without abnormal findings: Secondary | ICD-10-CM

## 2020-11-11 DIAGNOSIS — R569 Unspecified convulsions: Secondary | ICD-10-CM

## 2020-11-11 LAB — URINALYSIS
Bilirubin Urine: NEGATIVE
Hgb urine dipstick: NEGATIVE
Ketones, ur: NEGATIVE
Leukocytes,Ua: NEGATIVE
Nitrite: NEGATIVE
Specific Gravity, Urine: 1.015 (ref 1.000–1.030)
Total Protein, Urine: NEGATIVE
Urine Glucose: NEGATIVE
Urobilinogen, UA: 0.2 (ref 0.0–1.0)
pH: 6 (ref 5.0–8.0)

## 2020-11-11 LAB — HEMOGLOBIN A1C: Hgb A1c MFr Bld: 6.1 % (ref 4.6–6.5)

## 2020-11-11 MED ORDER — FUROSEMIDE 20 MG PO TABS
ORAL_TABLET | ORAL | 3 refills | Status: DC
  Filled 2020-11-11: qty 90, 90d supply, fill #0
  Filled 2021-09-26: qty 90, 90d supply, fill #1

## 2020-11-11 MED ORDER — LISINOPRIL 10 MG PO TABS
ORAL_TABLET | Freq: Every day | ORAL | 3 refills | Status: DC
  Filled 2020-11-11 – 2021-01-24 (×2): qty 90, 90d supply, fill #0
  Filled 2021-04-25: qty 90, 90d supply, fill #1
  Filled 2021-07-07: qty 90, 90d supply, fill #2

## 2020-11-11 MED ORDER — METFORMIN HCL 500 MG PO TABS
ORAL_TABLET | Freq: Two times a day (BID) | ORAL | 3 refills | Status: DC
  Filled 2020-11-11 – 2021-02-09 (×2): qty 180, 90d supply, fill #0
  Filled 2021-04-22: qty 180, 90d supply, fill #1
  Filled 2021-07-07: qty 180, 90d supply, fill #2

## 2020-11-11 NOTE — Progress Notes (Signed)
Subjective:    Patient ID: Lauren Espinoza, female    DOB: 07-12-1970, 50 y.o.   MRN: [id number]  HPI Here for a well exam. She feels great. We saw her in June when she spoke of premenopausal mood swings, and we prescribed Effexor XR. However after talking to a friend about this she decided not to try it. Instead, she talked to Dr. Henderson Cloud, her GYN, and they agreed she would try Prozac 20 mg daily. So far this has worked well for her. We did fairly complete labs that day, and everything looked good except she had a UTI which we treated her for.    Review of Systems  Constitutional: Negative.   HENT: Negative.    Eyes: Negative.   Respiratory: Negative.    Cardiovascular: Negative.   Gastrointestinal: Negative.   Genitourinary:  Negative for decreased urine volume, difficulty urinating, dyspareunia, dysuria, enuresis, flank pain, frequency, hematuria, pelvic pain and urgency.  Musculoskeletal: Negative.   Skin: Negative.   Neurological: Negative.  Negative for headaches.  Psychiatric/Behavioral: Negative.        Objective:   Physical Exam Constitutional:      General: She is not in acute distress.    Appearance: She is well-developed. She is obese.  HENT:     Head: Normocephalic and atraumatic.     Right Ear: External ear normal.     Left Ear: External ear normal.     Nose: Nose normal.     Mouth/Throat:     Pharynx: No oropharyngeal exudate.  Eyes:     General: No scleral icterus.    Conjunctiva/sclera: Conjunctivae normal.     Pupils: Pupils are equal, round, and reactive to light.  Neck:     Thyroid: No thyromegaly.     Vascular: No JVD.  Cardiovascular:     Rate and Rhythm: Normal rate and regular rhythm.     Heart sounds: Normal heart sounds. No murmur heard.   No friction rub. No gallop.  Pulmonary:     Effort: Pulmonary effort is normal. No respiratory distress.     Breath sounds: Normal breath sounds. No wheezing or rales.  Chest:     Chest wall: No  tenderness.  Abdominal:     General: Bowel sounds are normal. There is no distension.     Palpations: Abdomen is soft. There is no mass.     Tenderness: There is no abdominal tenderness. There is no guarding or rebound.  Musculoskeletal:        General: No tenderness. Normal range of motion.     Cervical back: Normal range of motion and neck supple.  Lymphadenopathy:     Cervical: No cervical adenopathy.  Skin:    General: Skin is warm and dry.     Findings: No erythema or rash.  Neurological:     Mental Status: She is alert and oriented to person, place, and time.     Cranial Nerves: No cranial nerve deficit.     Motor: No abnormal muscle tone.     Coordination: Coordination normal.     Deep Tendon Reflexes: Reflexes are normal and symmetric. Reflexes normal.  Psychiatric:        Behavior: Behavior normal.        Thought Content: Thought content normal.        Judgment: Judgment normal.          Assessment & Plan:  Well exam. We discussed diet and exercise. Get a phenobarbital level today and  recheck a UA.  Gershon Crane, MD

## 2020-11-13 LAB — PHENOBARBITAL LEVEL: Phenobarbital, Serum: 25.1 mg/L (ref 15.0–40.0)

## 2020-12-02 ENCOUNTER — Other Ambulatory Visit (HOSPITAL_COMMUNITY): Payer: Self-pay

## 2020-12-09 ENCOUNTER — Other Ambulatory Visit (HOSPITAL_COMMUNITY): Payer: Self-pay

## 2021-01-04 ENCOUNTER — Other Ambulatory Visit (HOSPITAL_COMMUNITY): Payer: Self-pay

## 2021-01-05 ENCOUNTER — Other Ambulatory Visit (HOSPITAL_COMMUNITY): Payer: Self-pay

## 2021-01-24 ENCOUNTER — Other Ambulatory Visit (HOSPITAL_COMMUNITY): Payer: Self-pay

## 2021-01-24 ENCOUNTER — Other Ambulatory Visit: Payer: Self-pay | Admitting: Family Medicine

## 2021-01-25 ENCOUNTER — Other Ambulatory Visit (HOSPITAL_COMMUNITY): Payer: Self-pay

## 2021-01-25 MED ORDER — HYDROCHLOROTHIAZIDE 25 MG PO TABS
ORAL_TABLET | Freq: Every day | ORAL | 1 refills | Status: DC
  Filled 2021-01-25: qty 90, 90d supply, fill #0
  Filled 2021-04-25: qty 90, 90d supply, fill #1

## 2021-01-31 ENCOUNTER — Encounter: Payer: Self-pay | Admitting: Family Medicine

## 2021-01-31 ENCOUNTER — Other Ambulatory Visit: Payer: Self-pay

## 2021-01-31 ENCOUNTER — Other Ambulatory Visit (HOSPITAL_COMMUNITY): Payer: Self-pay

## 2021-01-31 ENCOUNTER — Telehealth: Payer: Self-pay

## 2021-01-31 MED ORDER — METHYLPREDNISOLONE 4 MG PO TBPK
ORAL_TABLET | ORAL | 0 refills | Status: DC
Start: 2021-01-31 — End: 2021-02-08
  Filled 2021-01-31: qty 21, 6d supply, fill #0

## 2021-01-31 NOTE — Telephone Encounter (Signed)
Call in a 4 mg Medrol dose pack ,#21 tabs

## 2021-02-08 ENCOUNTER — Encounter: Payer: Self-pay | Admitting: Family Medicine

## 2021-02-08 ENCOUNTER — Telehealth (INDEPENDENT_AMBULATORY_CARE_PROVIDER_SITE_OTHER): Payer: BC Managed Care – PPO | Admitting: Family Medicine

## 2021-02-08 VITALS — Temp 97.6°F

## 2021-02-08 DIAGNOSIS — M79671 Pain in right foot: Secondary | ICD-10-CM

## 2021-02-08 DIAGNOSIS — M25571 Pain in right ankle and joints of right foot: Secondary | ICD-10-CM | POA: Diagnosis not present

## 2021-02-08 NOTE — Progress Notes (Signed)
Virtual Visit via Video Note  I connected with Lauren Espinoza  on 02/08/21 at 11:20 AM EST by a video enabled telemedicine application and verified that I am speaking with the correct person using two identifiers.  Location patient: home, [place] Location provider:work or home office Persons participating in the virtual visit: patient, provider  I discussed the limitations of evaluation and management by telemedicine and the availability of in person appointments. The patient expressed understanding and agreed to proceed.   HPI:  Acute telemedicine visit for Foot pain and swelling: -reports she has had gout a few times in the L foot and many family members have gout -reports usually clears up with nsaids - but did require prednisone in the past -this time it is in R foot -Onset: 10 days ago, has taken nsaids and steroid pack from PCP but has not  helped at all like it typically did in the past -cant this of an injury or new activity -Symptoms include: tender to touch, sore, swollen, redness in the R medial forefoot -Denies:fevers, chills, aches elsewhere -has been walking more as family member is in the hospital -Pertinent past medical history:see below -Pertinent medication allergies: No Known Allergies  ROS: See pertinent positives and negatives per HPI.  Past Medical History:  Diagnosis Date   BACK PAIN, UPPER 09/03/2008   Diabetes mellitus    DVT, HX OF 04/20/2008   Edema    Hyperlipidemia    HYPERTENSION 04/20/2008   Left shoulder pain    sees Dr Rodman Key DISORDER 04/20/2008   last seizure  1995    Past Surgical History:  Procedure Laterality Date   CESAREAN SECTION     CHOLECYSTECTOMY     COLONOSCOPY  03/03/2020   per Dr. Loreta Ave, clear, repeat in 10 yrs   ENDOMETRIAL ABLATION  08/09/2011   per Dr. Henderson Cloud      Current Outpatient Medications:    aspirin 325 MG tablet, Take 325 mg by mouth daily., Disp: , Rfl:    atorvastatin (LIPITOR) 40 MG tablet, TAKE 1 TABLET BY MOUTH  ONCE DAILY, Disp: 90 tablet, Rfl: 1   Blood Glucose Monitoring Suppl (FREESTYLE LITE) DEVI, Check blood sugar once daily, Disp: 1 each, Rfl: 0   diazepam (VALIUM) 5 MG tablet, Take 1 tablet (5 mg total) by mouth every 12 (twelve) hours as needed for anxiety., Disp: 180 tablet, Rfl: 1   FLUoxetine (PROZAC) 20 MG capsule, Take 1 capsule by mouth every day, Disp: 95 capsule, Rfl: 4   furosemide (LASIX) 20 MG tablet, Take one tablet by mouth once daily as needed for swelling, Disp: 90 tablet, Rfl: 3   glucose blood (FREESTYLE LITE) test strip, Use as instructed, Disp: 100 each, Rfl: 6   hydrochlorothiazide (HYDRODIURIL) 25 MG tablet, TAKE 1 TABLET BY MOUTH ONCE DAILY, Disp: 90 tablet, Rfl: 1   ketoconazole (NIZORAL) 2 % cream, APPLY TO THE AFFECTED AREA(S) ONCE DAILY, Disp: 120 g, Rfl: 4   levothyroxine (SYNTHROID) 75 MCG tablet, TAKE 1 TABLET (75 MCG TOTAL) BY MOUTH DAILY., Disp: 90 tablet, Rfl: 1   lisinopril (ZESTRIL) 10 MG tablet, TAKE 1 TABLET BY MOUTH ONCE DAILY, Disp: 90 tablet, Rfl: 3   metFORMIN (GLUCOPHAGE) 500 MG tablet, TAKE 1 TABLET BY MOUTH TWICE DAILY WITH A MEAL, Disp: 180 tablet, Rfl: 3   PHENobarbital (LUMINAL) 97.2 MG tablet, TAKE 1 TABLET BY MOUTH TWICE DAILY, Disp: 180 tablet, Rfl: 1  EXAM:  VITALS per patient if applicable:  GENERAL: alert, oriented, appears  well and in no acute distress  HEENT: atraumatic, conjunttiva clear, no obvious abnormalities on inspection of external nose and ears  NECK: normal movements of the head and neck  LUNGS: on inspection no signs of respiratory distress, breathing rate appears normal, no obvious gross SOB, gasping or wheezing  CV: no obvious cyanosis  MS: significant swelling of R forefoot with some erythema over the plantar and medial aspect of the 1st MTP area, has sig cracked callous on plantar aspect   PSYCH/NEURO: pleasant and cooperative, no obvious depression or anxiety, speech and thought processing grossly intact  ASSESSMENT  AND PLAN:  Discussed the following assessment and plan:  Right foot pain  -we discussed possible serious and likely etiologies, options for evaluation and workup, limitations of telemedicine visit vs in person visit, treatment, treatment risks and precautions. Pt is agreeable to treatment via telemedicine at this moment. Given has not responded to several treatments for gout and duration of symptoms did advise inperson eval with specialist to r/o other etiologies/confirm dx. Discussed options and she would like to utilize the walk in ortho urgent care at [facility] as she has seen providers there for other issues. She agrees to go this evening.   I discussed the assessment and treatment plan with the patient. The patient was provided an opportunity to ask questions and all were answered. The patient agreed with the plan and demonstrated an understanding of the instructions.     Terressa Koyanagi, DO

## 2021-02-08 NOTE — Patient Instructions (Signed)
Seek inperson evaluation at the orthopedic urgent care today.  I hope you are feeling better soon!  It was nice to meet you today. I help [facility] out with telemedicine visits on Tuesdays and Thursdays and am available for visits on those days. If you have any concerns or questions following this visit please schedule a follow up visit with your Primary Care doctor or seek care at a local urgent care clinic to avoid delays in care.

## 2021-02-09 ENCOUNTER — Other Ambulatory Visit (HOSPITAL_COMMUNITY): Payer: Self-pay

## 2021-02-11 ENCOUNTER — Other Ambulatory Visit (HOSPITAL_COMMUNITY): Payer: Self-pay

## 2021-02-17 DIAGNOSIS — H35033 Hypertensive retinopathy, bilateral: Secondary | ICD-10-CM | POA: Diagnosis not present

## 2021-03-02 ENCOUNTER — Other Ambulatory Visit (HOSPITAL_COMMUNITY): Payer: Self-pay

## 2021-03-07 ENCOUNTER — Other Ambulatory Visit (HOSPITAL_COMMUNITY): Payer: Self-pay

## 2021-03-25 DIAGNOSIS — R350 Frequency of micturition: Secondary | ICD-10-CM | POA: Diagnosis not present

## 2021-03-25 DIAGNOSIS — Z01419 Encounter for gynecological examination (general) (routine) without abnormal findings: Secondary | ICD-10-CM | POA: Diagnosis not present

## 2021-03-25 DIAGNOSIS — Z124 Encounter for screening for malignant neoplasm of cervix: Secondary | ICD-10-CM | POA: Diagnosis not present

## 2021-04-05 DIAGNOSIS — Z1231 Encounter for screening mammogram for malignant neoplasm of breast: Secondary | ICD-10-CM | POA: Diagnosis not present

## 2021-04-05 NOTE — Progress Notes (Signed)
[name] [address] [place] [address] Phone: [phone] Subjective:   Bruce Donath, am serving as a scribe for Dr. Antoine Primas.This visit occurred during the SARS-CoV-2 public health emergency.  Safety protocols were in place, including screening questions prior to the visit, additional usage of staff PPE, and extensive cleaning of exam room while observing appropriate contact time as indicated for disinfecting solutions.  I'm seeing this patient by the request  of:  [name], MD, Henderson Cloud MD   CC: Right foot pain  [id number]  Lauren Espinoza is a 51 y.o. female coming in with complaint of R foot pain. Saw PCP in November for pain in medial midfoot. History of gout. Was tx for gout recently with prednisone. Patient states that she had xray at [facility]. States that she was given a second round of prednisone. Pain did not clear up completely. Patient feeling pain over 1st MTP with weight bearing. History of ankle fracture 15 years ago and subsequent DVT.      Past Medical History:  Diagnosis Date   BACK PAIN, UPPER 09/03/2008   Diabetes mellitus    DVT, HX OF 04/20/2008   Edema    Hyperlipidemia    HYPERTENSION 04/20/2008   Left shoulder pain    sees Dr Rodman Key DISORDER 04/20/2008   last seizure  1995   Past Surgical History:  Procedure Laterality Date   CESAREAN SECTION     CHOLECYSTECTOMY     COLONOSCOPY  03/03/2020   per Dr. Loreta Ave, clear, repeat in 10 yrs   ENDOMETRIAL ABLATION  08/09/2011   per Dr. Henderson Cloud    Social History   Socioeconomic History   Marital status: Married    Spouse name: Not on file   Number of children: Not on file   Years of education: Not on file   Highest education level: Not on file  Occupational History   Occupation: Metallurgist     Employer: [employer]  Tobacco Use   Smoking status: Never   Smokeless tobacco: Never  Substance and Sexual Activity    Alcohol use: No    Alcohol/week: 0.0 standard drinks   Drug use: No   Sexual activity: Not on file  Other Topics Concern   Not on file  Social History Narrative   Not on file   Social Determinants of Health   Financial Resource Strain: Not on file  Food Insecurity: Not on file  Transportation Needs: Not on file  Physical Activity: Not on file  Stress: Not on file  Social Connections: Not on file   No Known Allergies Family History  Problem Relation Age of Onset   Diabetes Father    Diabetes Maternal Uncle    Diabetes Paternal Grandmother    Diabetes Paternal Grandfather     Current Outpatient Medications (Endocrine & Metabolic):    levothyroxine (SYNTHROID) 75 MCG tablet, TAKE 1 TABLET (75 MCG TOTAL) BY MOUTH DAILY.   metFORMIN (GLUCOPHAGE) 500 MG tablet, TAKE 1 TABLET BY MOUTH TWICE DAILY WITH MEALS.  Current Outpatient Medications (Cardiovascular):    atorvastatin (LIPITOR) 40 MG tablet, TAKE 1 TABLET BY MOUTH ONCE DAILY   furosemide (LASIX) 20 MG tablet, Take one tablet by mouth once daily as needed for swelling   hydrochlorothiazide (HYDRODIURIL) 25 MG tablet, TAKE 1 TABLET BY MOUTH ONCE DAILY   lisinopril (ZESTRIL) 10 MG tablet, TAKE 1 TABLET BY MOUTH ONCE DAILY   Current Outpatient Medications (  Analgesics):    aspirin 325 MG tablet, Take 325 mg by mouth daily.   Current Outpatient Medications (Other):    Blood Glucose Monitoring Suppl (FREESTYLE LITE) DEVI, Check blood sugar once daily   diazepam (VALIUM) 5 MG tablet, Take 1 tablet (5 mg total) by mouth every 12 (twelve) hours as needed for anxiety.   FLUoxetine (PROZAC) 20 MG capsule, Take 1 capsule by mouth every day   glucose blood (FREESTYLE LITE) test strip, Use as instructed   PHENobarbital (LUMINAL) 97.2 MG tablet, TAKE 1 TABLET BY MOUTH TWICE DAILY   Reviewed prior external information including notes and imaging from  primary care provider As well as notes that were available from care everywhere and  other healthcare systems.  Past medical history, social, surgical and family history all reviewed in electronic medical record.  No pertanent information unless stated regarding to the chief complaint.   Review of Systems:  No headache, visual changes, nausea, vomiting, diarrhea, constipation, dizziness, abdominal pain, skin rash, fevers, chills, night sweats, weight loss, swollen lymph nodes, body aches, joint swelling, chest pain, shortness of breath, mood changes. POSITIVE muscle aches  Objective  Blood pressure 104/72, pulse 97, height 5\' 10"  (1.778 m), SpO2 98 %.   General: No apparent distress alert and oriented x3 mood and affect normal, dressed appropriately.  HEENT: Pupils equal, extraocular movements intact  Respiratory: Patient's speak in full sentences and does not appear short of breath  Cardiovascular: No lower extremity edema, non tender, no erythema  Gait normal with good balance and coordination.  MSK: Foot exam shows that patient does have swelling noted of the first MTP.  Limited range of motion secondary to pain and swelling.  Patient neurovascularly intact distally.  No calf pain noted with it.  Limited muscular skeletal ultrasound was performed and interpreted by , M  Limited ultrasound of the first metatarsal shows the patient does have significant hypoechoic changes noted of the capsule of the first metatarsal.  Mild arthritic changes noted.  Severe increase in Doppler flow consistent with significant inflammation.  Patient also has some deposits that are consistent with gout. Impression: Acute on chronic gouty deposits of the first metatarsal    Impression and Recommendations:     The above documentation has been reviewed and is accurate and complete Antoine Primas, DO

## 2021-04-06 ENCOUNTER — Ambulatory Visit (INDEPENDENT_AMBULATORY_CARE_PROVIDER_SITE_OTHER): Payer: BC Managed Care – PPO

## 2021-04-06 ENCOUNTER — Ambulatory Visit: Payer: Self-pay

## 2021-04-06 ENCOUNTER — Other Ambulatory Visit: Payer: Self-pay

## 2021-04-06 ENCOUNTER — Ambulatory Visit: Payer: BC Managed Care – PPO | Admitting: Family Medicine

## 2021-04-06 ENCOUNTER — Encounter: Payer: Self-pay | Admitting: Family Medicine

## 2021-04-06 VITALS — BP 104/72 | HR 97 | Ht 70.0 in

## 2021-04-06 DIAGNOSIS — M79671 Pain in right foot: Secondary | ICD-10-CM | POA: Diagnosis not present

## 2021-04-06 DIAGNOSIS — M7989 Other specified soft tissue disorders: Secondary | ICD-10-CM | POA: Diagnosis not present

## 2021-04-06 DIAGNOSIS — G8929 Other chronic pain: Secondary | ICD-10-CM

## 2021-04-06 DIAGNOSIS — M25571 Pain in right ankle and joints of right foot: Secondary | ICD-10-CM

## 2021-04-06 DIAGNOSIS — M19071 Primary osteoarthritis, right ankle and foot: Secondary | ICD-10-CM | POA: Diagnosis not present

## 2021-04-06 DIAGNOSIS — M109 Gout, unspecified: Secondary | ICD-10-CM | POA: Insufficient documentation

## 2021-04-06 DIAGNOSIS — M10071 Idiopathic gout, right ankle and foot: Secondary | ICD-10-CM

## 2021-04-06 DIAGNOSIS — R600 Localized edema: Secondary | ICD-10-CM | POA: Diagnosis not present

## 2021-04-06 NOTE — Patient Instructions (Addendum)
Xray today Tart Cherry Extract 1200mg  at night Rigid soled shoes HOKA recovery sandals in the house See me in 4-6 weeks I will write Dr. Sarajane Jews about BP meds

## 2021-04-06 NOTE — Assessment & Plan Note (Signed)
For acute on chronic gout.  Patient states that there is a strong family history.  Ultrasound is consistent with gout with continued capsulitis.  Patient has had 2 rounds of prednisone.  Discussed topical anti-inflammatories, over-the-counter medications, discussed the possibility of changing medications as well from the hydrochlorothiazide but would not have primary care physician decide if that would be appropriate.  Also discussed the possibility of need for allopurinol.  Patient is in agreement with the plan.  Discussed avoiding being barefoot as well.  Follow-up with me again in 4 weeks

## 2021-04-08 ENCOUNTER — Other Ambulatory Visit: Payer: Self-pay | Admitting: Family Medicine

## 2021-04-08 ENCOUNTER — Other Ambulatory Visit (HOSPITAL_COMMUNITY): Payer: Self-pay

## 2021-04-08 MED ORDER — PHENOBARBITAL 97.2 MG PO TABS
ORAL_TABLET | Freq: Two times a day (BID) | ORAL | 1 refills | Status: DC
  Filled 2021-04-08: qty 180, 90d supply, fill #0
  Filled 2021-07-07: qty 180, 90d supply, fill #1

## 2021-04-08 NOTE — Telephone Encounter (Signed)
Last refill- 10/04/20--180 tabs, 1 refill Last VV with Dr. Volanda Napoleon- 02/08/21  No future OV scheduled.   Can this patient receive a refill?

## 2021-04-11 ENCOUNTER — Other Ambulatory Visit (HOSPITAL_COMMUNITY): Payer: Self-pay

## 2021-04-22 ENCOUNTER — Other Ambulatory Visit (HOSPITAL_COMMUNITY): Payer: Self-pay

## 2021-04-25 ENCOUNTER — Other Ambulatory Visit: Payer: Self-pay | Admitting: Family Medicine

## 2021-04-25 ENCOUNTER — Other Ambulatory Visit (HOSPITAL_COMMUNITY): Payer: Self-pay

## 2021-04-25 MED ORDER — LEVOTHYROXINE SODIUM 75 MCG PO TABS
ORAL_TABLET | Freq: Every day | ORAL | 0 refills | Status: DC
  Filled 2021-04-25: qty 90, 90d supply, fill #0

## 2021-05-05 NOTE — Progress Notes (Unsigned)
[facility] [address] [place] [address] Phone: [phone] Subjective:    I'm seeing this patient by the request  of:  [name], MD  CC:   [id number]  04/06/2021 For acute on chronic gout.  Patient states that there is a strong family history.  Ultrasound is consistent with gout with continued capsulitis.  Patient has had 2 rounds of prednisone.  Discussed topical anti-inflammatories, over-the-counter medications, discussed the possibility of changing medications as well from the hydrochlorothiazide but would not have primary care physician decide if that would be appropriate.  Also discussed the possibility of need for allopurinol.  Patient is in agreement with the plan.  Discussed avoiding being barefoot as well.  Follow-up with me again in 4 weeks  Update 05/10/2021 Lauren Espinoza is a 51 y.o. female coming in with complaint of R foot pain. Patient states   Xray R foot 04/06/2021 IMPRESSION: 1. Soft tissue swelling. 2. No acute fracture. 3. Osteoarthritis.    Xray R ankle 04/06/2021 IMPRESSION: 1. No acute bony abnormality. 2. Mild degenerative changes of the first metatarsophalangeal joint and talonavicular joint.      Past Medical History:  Diagnosis Date   BACK PAIN, UPPER 09/03/2008   Diabetes mellitus    DVT, HX OF 04/20/2008   Edema    Hyperlipidemia    HYPERTENSION 04/20/2008   Left shoulder pain    sees Dr Rodman Key DISORDER 04/20/2008   last seizure  1995   Past Surgical History:  Procedure Laterality Date   CESAREAN SECTION     CHOLECYSTECTOMY     COLONOSCOPY  03/03/2020   per Dr. Loreta Ave, clear, repeat in 10 yrs   ENDOMETRIAL ABLATION  08/09/2011   per Dr. Henderson Cloud    Social History   Socioeconomic History   Marital status: Married    Spouse name: Not on file   Number of children: Not on file   Years of education: Not on file   Highest education level: Not on file  Occupational History    Occupation: Metallurgist     Employer: [employer]  Tobacco Use   Smoking status: Never   Smokeless tobacco: Never  Substance and Sexual Activity   Alcohol use: No    Alcohol/week: 0.0 standard drinks   Drug use: No   Sexual activity: Not on file  Other Topics Concern   Not on file  Social History Narrative   Not on file   Social Determinants of Health   Financial Resource Strain: Not on file  Food Insecurity: Not on file  Transportation Needs: Not on file  Physical Activity: Not on file  Stress: Not on file  Social Connections: Not on file   No Known Allergies Family History  Problem Relation Age of Onset   Diabetes Father    Diabetes Maternal Uncle    Diabetes Paternal Grandmother    Diabetes Paternal Grandfather     Current Outpatient Medications (Endocrine & Metabolic):    levothyroxine (SYNTHROID) 75 MCG tablet, TAKE 1 TABLET (75 MCG TOTAL) BY MOUTH DAILY.   metFORMIN (GLUCOPHAGE) 500 MG tablet, TAKE 1 TABLET BY MOUTH TWICE DAILY WITH MEALS.  Current Outpatient Medications (Cardiovascular):    atorvastatin (LIPITOR) 40 MG tablet, TAKE 1 TABLET BY MOUTH ONCE DAILY   furosemide (LASIX) 20 MG tablet, Take one tablet by mouth once daily as needed for swelling   hydrochlorothiazide (HYDRODIURIL) 25 MG tablet, TAKE 1 TABLET BY MOUTH ONCE  DAILY   lisinopril (ZESTRIL) 10 MG tablet, TAKE 1 TABLET BY MOUTH ONCE DAILY   Current Outpatient Medications (Analgesics):    aspirin 325 MG tablet, Take 325 mg by mouth daily.   Current Outpatient Medications (Other):    Blood Glucose Monitoring Suppl (FREESTYLE LITE) DEVI, Check blood sugar once daily   diazepam (VALIUM) 5 MG tablet, Take 1 tablet (5 mg total) by mouth every 12 (twelve) hours as needed for anxiety.   FLUoxetine (PROZAC) 20 MG capsule, Take 1 capsule by mouth every day   glucose blood (FREESTYLE LITE) test strip, Use as instructed   PHENobarbital (LUMINAL) 97.2 MG tablet, TAKE 1 TABLET BY MOUTH  TWICE DAILY   Reviewed prior external information including notes and imaging from  primary care provider As well as notes that were available from care everywhere and other healthcare systems.  Past medical history, social, surgical and family history all reviewed in electronic medical record.  No pertanent information unless stated regarding to the chief complaint.   Review of Systems:  No headache, visual changes, nausea, vomiting, diarrhea, constipation, dizziness, abdominal pain, skin rash, fevers, chills, night sweats, weight loss, swollen lymph nodes, body aches, joint swelling, chest pain, shortness of breath, mood changes. POSITIVE muscle aches  Objective  There were no vitals taken for this visit.   General: No apparent distress alert and oriented x3 mood and affect normal, dressed appropriately.  HEENT: Pupils equal, extraocular movements intact  Respiratory: Patient's speak in full sentences and does not appear short of breath  Cardiovascular: No lower extremity edema, non tender, no erythema  Gait normal with good balance and coordination.  MSK:  Non tender with full range of motion and good stability and symmetric strength and tone of shoulders, elbows, wrist, hip, knee and ankles bilaterally.     Impression and Recommendations:     The above documentation has been reviewed and is accurate and complete Wilford Grist

## 2021-05-10 ENCOUNTER — Ambulatory Visit: Payer: BC Managed Care – PPO | Admitting: Family Medicine

## 2021-05-11 ENCOUNTER — Telehealth (INDEPENDENT_AMBULATORY_CARE_PROVIDER_SITE_OTHER): Payer: BC Managed Care – PPO | Admitting: Family Medicine

## 2021-05-11 ENCOUNTER — Encounter: Payer: Self-pay | Admitting: Family Medicine

## 2021-05-11 DIAGNOSIS — J029 Acute pharyngitis, unspecified: Secondary | ICD-10-CM

## 2021-05-11 MED ORDER — CEPHALEXIN 500 MG PO CAPS: 500.0000 mg | ORAL_CAPSULE | Freq: Three times a day (TID) | ORAL | 0 refills | Status: AC

## 2021-05-11 NOTE — Progress Notes (Signed)
Subjective:    Patient ID: Lauren Espinoza, female    DOB: 10-Nov-1970, 51 y.o.   MRN: [id number]  HPI Virtual Visit via Video Note  I connected with the patient on 05/11/21 at 11:00 AM EST by a video enabled telemedicine application and verified that I am speaking with the correct person using two identifiers.  Location patient: home Location provider:work or home office Persons participating in the virtual visit: patient, provider  I discussed the limitations of evaluation and management by telemedicine and the availability of in person appointments. The patient expressed understanding and agreed to proceed.   HPI: Here for what she thinks is a strep throat infection. For the past 3 days she has had a very ST with PND and a dry cough. No fever or body aches.  Both her daughter and her husband are currently being treated strep throats.    ROS: See pertinent positives and negatives per HPI.  Past Medical History:  Diagnosis Date   BACK PAIN, UPPER 09/03/2008   Diabetes mellitus    DVT, HX OF 04/20/2008   Edema    Hyperlipidemia    HYPERTENSION 04/20/2008   Left shoulder pain    sees Dr Ellyn Hack DISORDER 04/20/2008   last seizure  1995    Past Surgical History:  Procedure Laterality Date   CESAREAN SECTION     CHOLECYSTECTOMY     COLONOSCOPY  03/03/2020   per Dr. Collene Mares, clear, repeat in 10 yrs   ENDOMETRIAL ABLATION  08/09/2011   per Dr. Philis Pique     Family History  Problem Relation Age of Onset   Diabetes Father    Diabetes Maternal Uncle    Diabetes Paternal Grandmother    Diabetes Paternal Grandfather      Current Outpatient Medications:    aspirin 325 MG tablet, Take 325 mg by mouth daily., Disp: , Rfl:    atorvastatin (LIPITOR) 40 MG tablet, TAKE 1 TABLET BY MOUTH ONCE DAILY, Disp: 90 tablet, Rfl: 1   Blood Glucose Monitoring Suppl (FREESTYLE LITE) DEVI, Check blood sugar once daily, Disp: 1 each, Rfl: 0   cephALEXin (KEFLEX) 500 MG capsule, Take 1  capsule (500 mg total) by mouth 3 (three) times daily for 10 days., Disp: 30 capsule, Rfl: 0   diazepam (VALIUM) 5 MG tablet, Take 1 tablet (5 mg total) by mouth every 12 (twelve) hours as needed for anxiety., Disp: 180 tablet, Rfl: 1   FLUoxetine (PROZAC) 20 MG capsule, Take 1 capsule by mouth every day, Disp: 95 capsule, Rfl: 4   furosemide (LASIX) 20 MG tablet, Take one tablet by mouth once daily as needed for swelling, Disp: 90 tablet, Rfl: 3   glucose blood (FREESTYLE LITE) test strip, Use as instructed, Disp: 100 each, Rfl: 6   hydrochlorothiazide (HYDRODIURIL) 25 MG tablet, TAKE 1 TABLET BY MOUTH ONCE DAILY, Disp: 90 tablet, Rfl: 1   levothyroxine (SYNTHROID) 75 MCG tablet, TAKE 1 TABLET (75 MCG TOTAL) BY MOUTH DAILY., Disp: 90 tablet, Rfl: 0   lisinopril (ZESTRIL) 10 MG tablet, TAKE 1 TABLET BY MOUTH ONCE DAILY, Disp: 90 tablet, Rfl: 3   metFORMIN (GLUCOPHAGE) 500 MG tablet, TAKE 1 TABLET BY MOUTH TWICE DAILY WITH MEALS., Disp: 180 tablet, Rfl: 3   PHENobarbital (LUMINAL) 97.2 MG tablet, TAKE 1 TABLET BY MOUTH TWICE DAILY, Disp: 180 tablet, Rfl: 1  EXAM:  VITALS per patient if applicable:  GENERAL: alert, oriented, appears well and in no acute distress  HEENT: atraumatic, conjunttiva  clear, no obvious abnormalities on inspection of external nose and ears  NECK: normal movements of the head and neck  LUNGS: on inspection no signs of respiratory distress, breathing rate appears normal, no obvious gross SOB, gasping or wheezing  CV: no obvious cyanosis  MS: moves all visible extremities without noticeable abnormality  PSYCH/NEURO: pleasant and cooperative, no obvious depression or anxiety, speech and thought processing grossly intact  ASSESSMENT AND PLAN: This is likely a strep throat infection. Treat with 10 days of Keflex.  Alysia Penna, MD  Discussed the following assessment and plan:  No diagnosis found.     I discussed the assessment and treatment plan with the  patient. The patient was provided an opportunity to ask questions and all were answered. The patient agreed with the plan and demonstrated an understanding of the instructions.   The patient was advised to call back or seek an in-person evaluation if the symptoms worsen or if the condition fails to improve as anticipated.      Review of Systems     Objective:   Physical Exam        Assessment & Plan:

## 2021-05-16 NOTE — Progress Notes (Unsigned)
[facility] [address] [place] [address] Phone: [phone] Subjective:    I'm seeing this patient by the request  of:  Lauren Salisbury, MD  CC:   [id number]  04/06/2021 For acute on chronic gout.  Patient states that there is a strong family history.  Ultrasound is consistent with gout with continued capsulitis.  Patient has had 2 rounds of prednisone.  Discussed topical anti-inflammatories, over-the-counter medications, discussed the possibility of changing medications as well from the hydrochlorothiazide but would not have primary care physician decide if that would be appropriate.  Also discussed the possibility of need for allopurinol.  Patient is in agreement with the plan.  Discussed avoiding being barefoot as well.  Follow-up with me again in 4 weeks  Update 05/17/2021 Lauren Espinoza is a 51 y.o. female coming in with complaint of R foot pain. Patient states       Past Medical History:  Diagnosis Date   BACK PAIN, UPPER 09/03/2008   Diabetes mellitus    DVT, HX OF 04/20/2008   Edema    Hyperlipidemia    HYPERTENSION 04/20/2008   Left shoulder pain    sees Dr Lauren Espinoza DISORDER 04/20/2008   last seizure  1995   Past Surgical History:  Procedure Laterality Date   CESAREAN SECTION     CHOLECYSTECTOMY     COLONOSCOPY  03/03/2020   per Dr. Loreta Espinoza, clear, repeat in 10 yrs   ENDOMETRIAL ABLATION  08/09/2011   per Dr. Henderson Espinoza    Social History   Socioeconomic History   Marital status: Married    Spouse name: Not on file   Number of children: Not on file   Years of education: Not on file   Highest education level: Not on file  Occupational History   Occupation: Metallurgist     Employer: [employer]  Tobacco Use   Smoking status: Never   Smokeless tobacco: Never  Substance and Sexual Activity   Alcohol use: No    Alcohol/week: 0.0 standard drinks   Drug use: No   Sexual activity: Not on file  Other  Topics Concern   Not on file  Social History Narrative   Not on file   Social Determinants of Health   Financial Resource Strain: Not on file  Food Insecurity: Not on file  Transportation Needs: Not on file  Physical Activity: Not on file  Stress: Not on file  Social Connections: Not on file   No Known Allergies Family History  Problem Relation Age of Onset   Diabetes Father    Diabetes Maternal Uncle    Diabetes Paternal Grandmother    Diabetes Paternal Grandfather     Current Outpatient Medications (Endocrine & Metabolic):    levothyroxine (SYNTHROID) 75 MCG tablet, TAKE 1 TABLET (75 MCG TOTAL) BY MOUTH DAILY.   metFORMIN (GLUCOPHAGE) 500 MG tablet, TAKE 1 TABLET BY MOUTH TWICE DAILY WITH MEALS.  Current Outpatient Medications (Cardiovascular):    atorvastatin (LIPITOR) 40 MG tablet, TAKE 1 TABLET BY MOUTH ONCE DAILY   furosemide (LASIX) 20 MG tablet, Take one tablet by mouth once daily as needed for swelling   hydrochlorothiazide (HYDRODIURIL) 25 MG tablet, TAKE 1 TABLET BY MOUTH ONCE DAILY   lisinopril (ZESTRIL) 10 MG tablet, TAKE 1 TABLET BY MOUTH ONCE DAILY   Current Outpatient Medications (Analgesics):    aspirin 325 MG tablet, Take 325 mg by mouth daily.   Current Outpatient Medications (Other):  Blood Glucose Monitoring Suppl (FREESTYLE LITE) DEVI, Check blood sugar once daily   cephALEXin (KEFLEX) 500 MG capsule, Take 1 capsule (500 mg total) by mouth 3 (three) times daily for 10 days.   diazepam (VALIUM) 5 MG tablet, Take 1 tablet (5 mg total) by mouth every 12 (twelve) hours as needed for anxiety.   FLUoxetine (PROZAC) 20 MG capsule, Take 1 capsule by mouth every day   glucose blood (FREESTYLE LITE) test strip, Use as instructed   PHENobarbital (LUMINAL) 97.2 MG tablet, TAKE 1 TABLET BY MOUTH TWICE DAILY   Reviewed prior external information including notes and imaging from  primary care provider As well as notes that were available from care everywhere  and other healthcare systems.  Past medical history, social, surgical and family history all reviewed in electronic medical record.  No pertanent information unless stated regarding to the chief complaint.   Review of Systems:  No headache, visual changes, nausea, vomiting, diarrhea, constipation, dizziness, abdominal pain, skin rash, fevers, chills, night sweats, weight loss, swollen lymph nodes, body aches, joint swelling, chest pain, shortness of breath, mood changes. POSITIVE muscle aches  Objective  There were no vitals taken for this visit.   General: No apparent distress alert and oriented x3 mood and affect normal, dressed appropriately.  HEENT: Pupils equal, extraocular movements intact  Respiratory: Patient's speak in full sentences and does not appear short of breath  Cardiovascular: No lower extremity edema, non tender, no erythema  Gait normal with good balance and coordination.  MSK:  Non tender with full range of motion and good stability and symmetric strength and tone of shoulders, elbows, wrist, hip, knee and ankles bilaterally.     Impression and Recommendations:     The above documentation has been reviewed and is accurate and complete Lauren Espinoza

## 2021-05-17 ENCOUNTER — Ambulatory Visit: Payer: BC Managed Care – PPO | Admitting: Family Medicine

## 2021-05-18 ENCOUNTER — Encounter: Payer: Self-pay | Admitting: Family Medicine

## 2021-05-18 ENCOUNTER — Ambulatory Visit: Payer: BC Managed Care – PPO | Admitting: Family Medicine

## 2021-05-18 VITALS — BP 98/68 | HR 82 | Temp 98.3°F

## 2021-05-18 DIAGNOSIS — J069 Acute upper respiratory infection, unspecified: Secondary | ICD-10-CM

## 2021-05-18 MED ORDER — HYDROCODONE BIT-HOMATROP MBR 5-1.5 MG/5ML PO SOLN: 5.0000 mL | ORAL | 0 refills | Status: DC | PRN

## 2021-05-18 NOTE — Progress Notes (Signed)
? ?  Subjective:  ? ? Patient ID: Lauren Espinoza, female    DOB: Sep 18, 1970, 51 y.o.   MRN: [id number] ? ?HPI ?Here for 2 weeks of URI symptoms. This began with stuffy head, ST, and red eyes. We had a video visit with her on 05-11-21 and we gave her 10 days of Keflex. The eye and throat symptoms resolved but now she has a hard dry cough. No SOB or fever. Taking Mucinex.  ? ? ?Review of Systems  ?Constitutional: Negative.   ?HENT: Negative.    ?Eyes: Negative.   ?Respiratory:  Positive for cough. Negative for shortness of breath and wheezing.   ? ?   ?Objective:  ? Physical Exam ?Constitutional:   ?   Appearance: Normal appearance. She is not ill-appearing.  ?HENT:  ?   Right Ear: Tympanic membrane, ear canal and external ear normal.  ?   Left Ear: Tympanic membrane, ear canal and external ear normal.  ?   Nose: Nose normal.  ?   Mouth/Throat:  ?   Pharynx: Oropharynx is clear.  ?Eyes:  ?   Conjunctiva/sclera: Conjunctivae normal.  ?Cardiovascular:  ?   Rate and Rhythm: Normal rate and regular rhythm.  ?   Pulses: Normal pulses.  ?   Heart sounds: Normal heart sounds.  ?Pulmonary:  ?   Effort: Pulmonary effort is normal. No respiratory distress.  ?   Breath sounds: Normal breath sounds. No stridor. No wheezing, rhonchi or rales.  ?Lymphadenopathy:  ?   Cervical: No cervical adenopathy.  ?Neurological:  ?   Mental Status: She is alert.  ? ? ? ? ? ?   ?Assessment & Plan:  ?Viral URI (likely an adenovirus). She will drink fluids and use Hycodan for the cough as needed. This should resolve over the next week.  ?Alysia Penna, MD ? ? ?

## 2021-05-26 NOTE — Progress Notes (Signed)
?Lauren Espinoza D.O. ?[facility] ?[address] [place] [address] ?Phone: [phone] ?Subjective:   ?I, Lauren Espinoza, am serving as a Neurosurgeon for Dr. Antoine Espinoza. ?This visit occurred during the SARS-CoV-2 public health emergency.  Safety protocols were in place, including screening questions prior to the visit, additional usage of staff PPE, and extensive cleaning of exam room while observing appropriate contact time as indicated for disinfecting solutions.  ? ?I'm seeing this patient by the request  of:  Lauren Salisbury, MD ? ?CC: Foot pain follow-up ? ?[id number]  ?04/06/2021 ?For acute on chronic gout.  Patient states that there is a strong family history.  Ultrasound is consistent with gout with continued capsulitis.  Patient has had 2 rounds of prednisone.  Discussed topical anti-inflammatories, over-the-counter medications, discussed the possibility of changing medications as well from the hydrochlorothiazide but would not have primary care physician decide if that would be appropriate.  Also discussed the possibility of need for allopurinol.  Patient is in agreement with the plan.  Discussed avoiding being barefoot as well.  Follow-up with me again in 4 weeks ? ?Update 05/27/2021 ?Lauren Espinoza is a 51 y.o. female coming in with complaint of R foot pain. Patient states feel like her foot is a little better. Sporadic pain. Has been taking tart cherry. No new complaints. ? ? ?  ? ?Past Medical History:  ?Diagnosis Date  ? BACK PAIN, UPPER 09/03/2008  ? Diabetes mellitus   ? DVT, HX OF 04/20/2008  ? Edema   ? Hyperlipidemia   ? HYPERTENSION 04/20/2008  ? Left shoulder pain   ? sees Dr Lauren Espinoza  ? SEIZURE DISORDER 04/20/2008  ? last seizure  1995  ? ?Past Surgical History:  ?Procedure Laterality Date  ? CESAREAN SECTION    ? CHOLECYSTECTOMY    ? COLONOSCOPY  03/03/2020  ? per Dr. Loreta Espinoza, clear, repeat in 10 yrs  ? ENDOMETRIAL ABLATION  08/09/2011  ? per Dr. Henderson Espinoza   ? ?Social History   ? ?Socioeconomic History  ? Marital status: Married  ?  Spouse name: Not on file  ? Number of children: Not on file  ? Years of education: Not on file  ? Highest education level: Not on file  ?Occupational History  ? Occupation: Metallurgist   ?  Employer: [employer]  ?Tobacco Use  ? Smoking status: Never  ? Smokeless tobacco: Never  ?Substance and Sexual Activity  ? Alcohol use: No  ?  Alcohol/week: 0.0 standard drinks  ? Drug use: No  ? Sexual activity: Not on file  ?Other Topics Concern  ? Not on file  ?Social History Narrative  ? Not on file  ? ?Social Determinants of Health  ? ?Financial Resource Strain: Not on file  ?Food Insecurity: Not on file  ?Transportation Needs: Not on file  ?Physical Activity: Not on file  ?Stress: Not on file  ?Social Connections: Not on file  ? ?No Known Allergies ?Family History  ?Problem Relation Age of Onset  ? Diabetes Father   ? Diabetes Maternal Uncle   ? Diabetes Paternal Grandmother   ? Diabetes Paternal Grandfather   ? ? ?Current Outpatient Medications (Endocrine & Metabolic):  ?  levothyroxine (SYNTHROID) 75 MCG tablet, TAKE 1 TABLET (75 MCG TOTAL) BY MOUTH DAILY. ?  metFORMIN (GLUCOPHAGE) 500 MG tablet, TAKE 1 TABLET BY MOUTH TWICE DAILY WITH MEALS. ? ?Current Outpatient Medications (Cardiovascular):  ?  atorvastatin (LIPITOR) 40 MG tablet, TAKE 1 TABLET BY  MOUTH ONCE DAILY ?  furosemide (LASIX) 20 MG tablet, Take one tablet by mouth once daily as needed for swelling ?  hydrochlorothiazide (HYDRODIURIL) 25 MG tablet, TAKE 1 TABLET BY MOUTH ONCE DAILY ?  lisinopril (ZESTRIL) 10 MG tablet, TAKE 1 TABLET BY MOUTH ONCE DAILY ? ?Current Outpatient Medications (Respiratory):  ?  HYDROcodone bit-homatropine (HYCODAN) 5-1.5 MG/5ML syrup, Take 5 mLs by mouth every 4 (four) hours as needed for cough. ? ?Current Outpatient Medications (Analgesics):  ?  aspirin 325 MG tablet, Take 325 mg by mouth daily. ? ? ?Current Outpatient Medications (Other):  ?  Blood Glucose  Monitoring Suppl (FREESTYLE LITE) DEVI, Check blood sugar once daily ?  FLUoxetine (PROZAC) 20 MG capsule, Take 1 capsule by mouth every day ?  glucose blood (FREESTYLE LITE) test strip, Use as instructed ?  PHENobarbital (LUMINAL) 97.2 MG tablet, TAKE 1 TABLET BY MOUTH TWICE DAILY ? ? ?Reviewed prior external information including notes and imaging from  ?primary care provider ?As well as notes that were available from care everywhere and other healthcare systems. ? ?Past medical history, social, surgical and family history all reviewed in electronic medical record.  No pertanent information unless stated regarding to the chief complaint.  ? ?Review of Systems: ? No headache, visual changes, nausea, vomiting, diarrhea, constipation, dizziness, abdominal pain, skin rash, fevers, chills, night sweats, weight loss, swollen lymph nodes, body aches, joint swelling, chest pain, shortness of breath, mood changes. POSITIVE muscle aches ? ?Objective  ?Blood pressure 110/70, pulse 81, height 5\' 10"  (1.778 m), SpO2 97 %. ?  ?General: No apparent distress alert and oriented x3 mood and affect normal, dressed appropriately.  ?HEENT: Pupils equal, extraocular movements intact  ?Respiratory: Patient's speak in full sentences and does not appear short of breath  ?Cardiovascular: No lower extremity edema, non tender, no erythema  ?Gait normal with good balance and coordination.  ?MSK: Right foot still shows that there is some swelling noted of the first MTP.  Hallux limitus noted.  Tender to palpation.  No increased though in temperature.  No overlying erythema. ? ?Limited muscular skeletal ultrasound was performed and interpreted by , M   ?Limited ultrasound of the first MTP of the right toes she also notes there is a hypoechoic changes.  Underlying arthritic changes also noted.  No increase in neovascularization or Doppler flow from previous exam. ?Continued effusion of the first MTP ? ?Procedure: Real-time  Ultrasound Guided Injection of right first MTP ?Device: GE Logiq Q7 ?Ultrasound guided injection is preferred based studies that show increased duration, increased effect, greater accuracy, decreased procedural pain, increased response rate, and decreased cost with ultrasound guided versus blind injection.  ?Verbal informed consent obtained.  ?Time-out conducted.  ?Noted no overlying erythema, induration, or other signs of local infection.  ?Skin prepped in a sterile fashion.  ?Local anesthesia: Topical Ethyl chloride.  ?With sterile technique and under real time ultrasound guidance: With a 25-gauge half inch needle injected with 0.5 cc of 0.5% Marcaine and 0.5 cc of Kenalog 40 mg/mL ?Completed without difficulty  ?Pain immediately resolved suggesting accurate placement of the medication.  ?Advised to call if fevers/chills, erythema, induration, drainage, or persistent bleeding.  ?Impression: Technically successful ultrasound guided injection. ? ?  ?Impression and Recommendations:  ?  ?The above documentation has been reviewed and is accurate and complete Lauren Primas, DO ? ? ? ?

## 2021-05-27 ENCOUNTER — Other Ambulatory Visit: Payer: Self-pay

## 2021-05-27 ENCOUNTER — Ambulatory Visit: Payer: BC Managed Care – PPO | Admitting: Family Medicine

## 2021-05-27 ENCOUNTER — Ambulatory Visit: Payer: Self-pay

## 2021-05-27 VITALS — BP 110/70 | HR 81 | Ht 70.0 in

## 2021-05-27 DIAGNOSIS — M10071 Idiopathic gout, right ankle and foot: Secondary | ICD-10-CM

## 2021-05-27 NOTE — Assessment & Plan Note (Signed)
Patient given injection today and tolerated the procedure well, discussed icing regimen and home exercise, discussed which activities to do which wants to avoid, increase activity slowly.  Patient will wear better shoes at the moment.  Follow-up with me again in 6 to 8 weeks. ?

## 2021-05-27 NOTE — Patient Instructions (Signed)
Good to see you! ?Avoid being barefoot ?Tart Cherry 2400mg  ?See you again in 2 months ?

## 2021-06-07 ENCOUNTER — Ambulatory Visit: Payer: BC Managed Care – PPO | Admitting: Family Medicine

## 2021-06-09 ENCOUNTER — Other Ambulatory Visit: Payer: Self-pay | Admitting: Family Medicine

## 2021-06-09 ENCOUNTER — Other Ambulatory Visit (HOSPITAL_COMMUNITY): Payer: Self-pay

## 2021-06-09 MED ORDER — ATORVASTATIN CALCIUM 40 MG PO TABS
ORAL_TABLET | Freq: Every day | ORAL | 0 refills | Status: DC
  Filled 2021-06-09: qty 90, 90d supply, fill #0

## 2021-06-20 ENCOUNTER — Other Ambulatory Visit (HOSPITAL_COMMUNITY): Payer: Self-pay

## 2021-06-22 ENCOUNTER — Other Ambulatory Visit (HOSPITAL_COMMUNITY): Payer: Self-pay

## 2021-07-05 ENCOUNTER — Ambulatory Visit: Payer: BC Managed Care – PPO | Admitting: Family Medicine

## 2021-07-05 VITALS — BP 112/74 | HR 81 | Ht 70.0 in

## 2021-07-05 DIAGNOSIS — M79671 Pain in right foot: Secondary | ICD-10-CM | POA: Diagnosis not present

## 2021-07-05 DIAGNOSIS — M10071 Idiopathic gout, right ankle and foot: Secondary | ICD-10-CM

## 2021-07-05 LAB — URIC ACID: Uric Acid, Serum: 7.9 mg/dL — ABNORMAL HIGH (ref 2.4–7.0)

## 2021-07-05 NOTE — Progress Notes (Signed)
? ?  I, Philbert Riser, LAT, ATC acting as a scribe for Clementeen Graham, MD. ? ?Lauren Espinoza is a 51 y.o. female who presents to [facility] today for R foot pain. Pt was previously seen by Dr. Katrinka Blazing initially on 04/06/21 w/ an acute on chronic gout flare in her R 1st MTP and again on 05/27/21 and was given a steroid injection in her R 1st MTP. Today, pt reports R foot starting hurting Friday PM w/ sudden onset. Pt locates pain to along the plantar aspect of the middle arch. Pt just returned from the beach. Pt is treating her gout w/ tart cherry extract. ? ?R foot swelling: slightly ?Aggravates: any pressure, standing ?Treatments tried: IBU, cold compress ? ?Dx imaging: 04/06/21 R foot & ankle XR ? ?Pertinent review of systems: No fevers or chills ? ?Relevant historical information: Diabetes.  Gout. ? ? ?Exam:  ?BP 112/74   Pulse 81   Ht 5\' 10"  (1.778 m)   SpO2 96%   BMI 39.46 kg/m?  ?General: Well Developed, well nourished, and in no acute distress.  ? ?MSK: Left foot bunion formation great toe.  Otherwise normal. ?Great toe is nontender. ?Mid plantar arch mildly tender palpation with palpable nodule plantar midfoot.  The nodule is nontender. ?Normal foot and ankle motion. ?Ankle strength are intact without significant pain. ? ? ? ?Lab and Radiology Results ?\ ?Lab Results  ?Component Value Date  ? LABURIC 7.8 (H) 11/07/2018  ? ? ? ? ? ? ?Assessment and Plan: ?51 y.o. female with left mid arch pain.  Pain thought to be due to arch strain or  Plantar fibromatosis exacerbation.  Heart strain is much more likely.  Plan for home exercise program focused on eccentric stretching and strengthening and icing. ? ?She does have a history of gout which I do not think is the source of pain here.  That does seem to be pretty well controlled with her cherry.  However she does have a follow-up with my partner Dr. 52 in a few weeks.  Reasonable to recheck uric acid to see if allopurinol should be  started or adjusted.  It was elevated when last checked about 2 and half years ago. ? ? ?PDMP not reviewed this encounter. ?Orders Placed This Encounter  ?Procedures  ? Uric acid  ?  Standing Status:   Future  ?  Number of Occurrences:   1  ?  Standing Expiration Date:   07/06/2022  ? ?No orders of the defined types were placed in this encounter. ? ? ? ?Discussed warning signs or symptoms. Please see discharge instructions. Patient expresses understanding. ? ? ?The above documentation has been reviewed and is accurate and complete 07/08/2022, M.D. ? ? ?

## 2021-07-05 NOTE — Patient Instructions (Addendum)
Thank you for coming in today.  ? ?Ice massage.  ? ?Stretching exercises. ? ? ?Please get labs today before you leave  ? ?Recheck with Dr Tamala Julian in May as scheduled.  ?

## 2021-07-06 ENCOUNTER — Other Ambulatory Visit (HOSPITAL_COMMUNITY): Payer: Self-pay

## 2021-07-06 ENCOUNTER — Telehealth: Payer: Self-pay | Admitting: Family Medicine

## 2021-07-06 MED ORDER — ALLOPURINOL 300 MG PO TABS
300.0000 mg | ORAL_TABLET | Freq: Every day | ORAL | 1 refills | Status: DC
  Filled 2021-07-06: qty 90, 90d supply, fill #0
  Filled 2021-10-03: qty 90, 90d supply, fill #1

## 2021-07-06 NOTE — Telephone Encounter (Signed)
Called pt and LM to return call to advise her of new rx.  Please relay message if pt returns call. ?

## 2021-07-06 NOTE — Telephone Encounter (Signed)
Plan to start allopurinol 300 mg daily.  Take 1 pill a day.  This should lower uric acid and prevent gout attacks. ?Medication sent to [facility] outpatient pharmacy. ?

## 2021-07-06 NOTE — Telephone Encounter (Signed)
Discussed with pt

## 2021-07-06 NOTE — Progress Notes (Signed)
Uric acid is elevated.  I will discuss with Dr. Tamala Julian about what our plan should be for the appointment on May 10

## 2021-07-07 ENCOUNTER — Other Ambulatory Visit: Payer: Self-pay | Admitting: Family Medicine

## 2021-07-07 ENCOUNTER — Other Ambulatory Visit (HOSPITAL_COMMUNITY): Payer: Self-pay

## 2021-07-07 MED ORDER — HYDROCHLOROTHIAZIDE 25 MG PO TABS
ORAL_TABLET | Freq: Every day | ORAL | 1 refills | Status: DC
  Filled 2021-07-07: qty 90, 90d supply, fill #0
  Filled 2021-10-19: qty 90, 90d supply, fill #1

## 2021-07-08 ENCOUNTER — Other Ambulatory Visit (HOSPITAL_COMMUNITY): Payer: Self-pay

## 2021-07-12 ENCOUNTER — Encounter: Payer: Self-pay | Admitting: Family Medicine

## 2021-07-12 DIAGNOSIS — Z713 Dietary counseling and surveillance: Secondary | ICD-10-CM | POA: Diagnosis not present

## 2021-07-14 NOTE — Telephone Encounter (Signed)
Actually this would be a good thing for her to try since she does have diabetes. Have her make an OV to discuss the options  ?

## 2021-07-18 ENCOUNTER — Encounter: Payer: Self-pay | Admitting: Family Medicine

## 2021-07-18 ENCOUNTER — Other Ambulatory Visit (HOSPITAL_COMMUNITY): Payer: Self-pay

## 2021-07-18 ENCOUNTER — Ambulatory Visit: Payer: BC Managed Care – PPO | Admitting: Family Medicine

## 2021-07-18 VITALS — BP 106/74 | HR 83 | Temp 98.9°F | Wt 280.0 lb

## 2021-07-18 DIAGNOSIS — I10 Essential (primary) hypertension: Secondary | ICD-10-CM

## 2021-07-18 DIAGNOSIS — E119 Type 2 diabetes mellitus without complications: Secondary | ICD-10-CM

## 2021-07-18 MED ORDER — TIRZEPATIDE 2.5 MG/0.5ML ~~LOC~~ SOAJ
2.5000 mg | SUBCUTANEOUS | 2 refills | Status: DC
  Filled 2021-07-18 – 2021-07-22 (×2): qty 2, 28d supply, fill #0
  Filled 2021-08-10: qty 2, 28d supply, fill #1

## 2021-07-18 MED ORDER — FREESTYLE LITE TEST VI STRP
1.0000 | ORAL_STRIP | Freq: Every day | 3 refills | Status: AC
  Filled 2021-07-18: qty 50, 50d supply, fill #0
  Filled 2021-09-26: qty 100, 100d supply, fill #1
  Filled 2021-12-13: qty 100, 100d supply, fill #2

## 2021-07-18 MED ORDER — LISINOPRIL 10 MG PO TABS: 5.0000 mg | ORAL_TABLET | Freq: Every day | ORAL | 3 refills | Status: DC

## 2021-07-18 NOTE — Progress Notes (Signed)
? ?  Subjective:  ? ? Patient ID: Cherye Gaertner, female    DOB: 1970-08-04, 51 y.o.   MRN: [id number] ? ?HPI ?Here to discuss trying Mounjaro. He diabetes has been well controlled with diet and Metformin. Her A1c here today is 5.9%. She feels well. She wants to try [facility] to lose some weight. Her BP has been steady but a bit low.  ? ? ?Review of Systems  ?Constitutional: Negative.   ?Respiratory: Negative.    ?Cardiovascular: Negative.   ? ?   ?Objective:  ? Physical Exam ?Constitutional:   ?   Appearance: She is obese.  ?Cardiovascular:  ?   Rate and Rhythm: Normal rate and regular rhythm.  ?   Pulses: Normal pulses.  ?   Heart sounds: Normal heart sounds.  ?Pulmonary:  ?   Effort: Pulmonary effort is normal.  ?   Breath sounds: Normal breath sounds.  ?Neurological:  ?   Mental Status: She is alert.  ? ? ? ? ? ?   ?Assessment & Plan:  ?For the HTN, we will decrease the Lisinopril to 1/2 tab (5 mg) daily. For the DM, we will stop Metformin and start on Mounjaro 2.5 mg weekly for 4 weeks. We will follow up in 4 weeks.  ?Gershon Crane, MD ? ? ?

## 2021-07-19 DIAGNOSIS — H6123 Impacted cerumen, bilateral: Secondary | ICD-10-CM | POA: Diagnosis not present

## 2021-07-20 ENCOUNTER — Other Ambulatory Visit (HOSPITAL_COMMUNITY): Payer: Self-pay

## 2021-07-21 ENCOUNTER — Other Ambulatory Visit (HOSPITAL_COMMUNITY): Payer: Self-pay

## 2021-07-22 ENCOUNTER — Other Ambulatory Visit (HOSPITAL_COMMUNITY): Payer: Self-pay

## 2021-07-25 ENCOUNTER — Encounter: Payer: Self-pay | Admitting: Family Medicine

## 2021-07-25 ENCOUNTER — Other Ambulatory Visit (HOSPITAL_COMMUNITY): Payer: Self-pay

## 2021-07-26 ENCOUNTER — Other Ambulatory Visit (HOSPITAL_COMMUNITY): Payer: Self-pay

## 2021-07-26 NOTE — Telephone Encounter (Signed)
I will be on the lookout for the PA information  ?

## 2021-07-26 NOTE — Telephone Encounter (Signed)
FYI ?This  Rx just got approved, Received an approval today for Rx, patient will be notified ?

## 2021-07-26 NOTE — Telephone Encounter (Signed)
Spoke with pt pharmacy advised that Pt Rx is being prepared and pt will be notified when ready for pick up. Pt is aware ?

## 2021-07-27 ENCOUNTER — Ambulatory Visit: Payer: BC Managed Care – PPO | Admitting: Family Medicine

## 2021-08-10 ENCOUNTER — Other Ambulatory Visit (HOSPITAL_COMMUNITY): Payer: Self-pay

## 2021-08-18 ENCOUNTER — Telehealth: Payer: BC Managed Care – PPO | Admitting: Physician Assistant

## 2021-08-18 ENCOUNTER — Other Ambulatory Visit (HOSPITAL_COMMUNITY): Payer: Self-pay

## 2021-08-18 ENCOUNTER — Ambulatory Visit: Payer: BC Managed Care – PPO | Admitting: Family Medicine

## 2021-08-18 DIAGNOSIS — J029 Acute pharyngitis, unspecified: Secondary | ICD-10-CM | POA: Diagnosis not present

## 2021-08-18 MED ORDER — AMOXICILLIN 500 MG PO CAPS
500.0000 mg | ORAL_CAPSULE | Freq: Two times a day (BID) | ORAL | 0 refills | Status: AC
  Filled 2021-08-18: qty 20, 10d supply, fill #0

## 2021-08-18 NOTE — Progress Notes (Signed)

## 2021-08-18 NOTE — Progress Notes (Signed)
I have spent 5 minutes in review of e-visit questionnaire, review and updating patient chart, medical decision making and response to patient.   Heaton Sarin Cody Raeden Schippers, PA-C    

## 2021-08-19 NOTE — Progress Notes (Unsigned)
Lauren Espinoza [address] [place] [address] Phone: [phone] Subjective:   Lauren Espinoza, am serving as a scribe for Dr. Antoine Primas.   I'm seeing this patient by the request  of:  [name], MD  CC: right foot   [id number]  05/27/2021 Patient given injection today and tolerated the procedure well, discussed icing regimen and home exercise, discussed which activities to do which wants to avoid, increase activity slowly.  Patient will wear better shoes at the moment.  Follow-up with me again in 6 to 8 weeks.  Update 08/22/2021 Lauren Espinoza is a 51 y.o. female coming in with complaint of gout, R foot. Saw Dr. Denyse Espinoza for gout in April 2023 as well. Patient states that she feels like she is moving in right direction.  Has been taking the allopurinol and does notice some improvement.      Past Medical History:  Diagnosis Date   BACK PAIN, UPPER 09/03/2008   Diabetes mellitus    DVT, HX OF 04/20/2008   Edema    Hyperlipidemia    HYPERTENSION 04/20/2008   Left shoulder pain    sees Dr Rodman Key DISORDER 04/20/2008   last seizure  1995   Past Surgical History:  Procedure Laterality Date   CESAREAN SECTION     CHOLECYSTECTOMY     COLONOSCOPY  03/03/2020   per Dr. Loreta Ave, clear, repeat in 10 yrs   ENDOMETRIAL ABLATION  08/09/2011   per Dr. Henderson Cloud    Social History   Socioeconomic History   Marital status: Married    Spouse name: Not on file   Number of children: Not on file   Years of education: Not on file   Highest education level: Not on file  Occupational History   Occupation: Metallurgist     Employer: [employer]  Tobacco Use   Smoking status: Never   Smokeless tobacco: Never  Substance and Sexual Activity   Alcohol use: No    Alcohol/week: 0.0 standard drinks   Drug use: No   Sexual activity: Not on file  Other Topics Concern   Not on file  Social History Narrative   Not on file    Social Determinants of Health   Financial Resource Strain: Not on file  Food Insecurity: Not on file  Transportation Needs: Not on file  Physical Activity: Not on file  Stress: Not on file  Social Connections: Not on file   No Known Allergies Family History  Problem Relation Age of Onset   Diabetes Father    Diabetes Maternal Uncle    Diabetes Paternal Grandmother    Diabetes Paternal Grandfather     Current Outpatient Medications (Endocrine & Metabolic):    levothyroxine (SYNTHROID) 75 MCG tablet, TAKE 1 TABLET (75 MCG TOTAL) BY MOUTH DAILY.   tirzepatide [facility]) 2.5 MG/0.5ML Pen, Inject 2.5 mg into the skin once a week.  Current Outpatient Medications (Cardiovascular):    atorvastatin (LIPITOR) 40 MG tablet, TAKE 1 TABLET BY MOUTH ONCE DAILY   furosemide (LASIX) 20 MG tablet, Take one tablet by mouth once daily as needed for swelling   hydrochlorothiazide (HYDRODIURIL) 25 MG tablet, TAKE 1 TABLET BY MOUTH ONCE DAILY   lisinopril (ZESTRIL) 10 MG tablet, Take 0.5 tablets (5 mg total) by mouth daily.   Current Outpatient Medications (Analgesics):    allopurinol (ZYLOPRIM) 300 MG tablet, Take 1 tablet (300 mg total) by mouth daily.   aspirin 325  MG tablet, Take 325 mg by mouth daily.   Current Outpatient Medications (Other):    amoxicillin (AMOXIL) 500 MG capsule, Take 1 capsule (500 mg total) by mouth 2 (two) times daily for 10 days.   Black Cohosh 540 MG CAPS, Take by mouth daily at 12 noon.   Blood Glucose Monitoring Suppl (FREESTYLE LITE) DEVI, Check blood sugar once daily   FLUoxetine (PROZAC) 20 MG capsule, Take 1 capsule by mouth every day   glucose blood (FREESTYLE LITE) test strip, Use as instructed daily   PHENobarbital (LUMINAL) 97.2 MG tablet, TAKE 1 TABLET BY MOUTH TWICE DAILY     Review of Systems:  No headache, visual changes, nausea, vomiting, diarrhea, constipation, dizziness, abdominal pain, skin rash, fevers, chills, night sweats, weight loss,  swollen lymph nodes, body aches, joint swelling, chest pain, shortness of breath, mood changes. POSITIVE muscle aches  Objective  Blood pressure 106/80, pulse 97, height 5\' 10"  (1.778 m), weight 280 lb (127 kg), SpO2 98 %.   General: No apparent distress alert and oriented x3 mood and affect normal, dressed appropriately.  HEENT: Pupils equal, extraocular movements intact  Respiratory: Patient's speak in full sentences and does not appear short of breath  Cardiovascular: No lower extremity edema, non tender, no erythema  Gait normal with good balance and coordination.  MSK: Left foot exam still shows a breakdown of the transverse arch.  Hallux limitus noted right greater than left.  Patient does have some rigidity noted.  Limited muscular skeletal ultrasound was performed and interpreted by , M  Limited ultrasound of patient's right foot still shows some hypoechoic changes consistent with a small effusion of the joint.  The contralateral side does have a fairly large bone spur noted. Impression: Still mild effusion noted   Impression and Recommendations:     The above documentation has been reviewed and is accurate and complete Antoine Primas, DO

## 2021-08-20 ENCOUNTER — Other Ambulatory Visit (HOSPITAL_COMMUNITY): Payer: Self-pay

## 2021-08-22 ENCOUNTER — Ambulatory Visit: Payer: Self-pay

## 2021-08-22 ENCOUNTER — Ambulatory Visit: Payer: BC Managed Care – PPO | Admitting: Family Medicine

## 2021-08-22 VITALS — BP 106/80 | HR 97 | Ht 70.0 in | Wt 280.0 lb

## 2021-08-22 DIAGNOSIS — M79671 Pain in right foot: Secondary | ICD-10-CM

## 2021-08-22 DIAGNOSIS — M10071 Idiopathic gout, right ankle and foot: Secondary | ICD-10-CM | POA: Diagnosis not present

## 2021-08-22 LAB — URIC ACID: Uric Acid, Serum: 5.9 mg/dL (ref 2.4–7.0)

## 2021-08-22 NOTE — Assessment & Plan Note (Signed)
Significant improvement at this time.  No significant other changes in management.  Discussed icing regimen and home exercises.  Discussed continuing the allopurinol 300 mg at this moment.  We will recheck uric acid level.  We will see if we can titrate down at a later date.  We will follow-up with me again in 3 months

## 2021-08-22 NOTE — Patient Instructions (Signed)
Labs today Have Dr. Clent Ridges check it at your physical Throw shoes away and go shopping See me in 2-3 months just in case

## 2021-08-24 ENCOUNTER — Ambulatory Visit: Payer: BC Managed Care – PPO | Admitting: Family Medicine

## 2021-08-24 ENCOUNTER — Encounter: Payer: Self-pay | Admitting: Family Medicine

## 2021-08-24 ENCOUNTER — Other Ambulatory Visit (HOSPITAL_COMMUNITY): Payer: Self-pay

## 2021-08-24 VITALS — BP 108/70 | HR 79 | Temp 98.5°F | Wt 275.0 lb

## 2021-08-24 DIAGNOSIS — E119 Type 2 diabetes mellitus without complications: Secondary | ICD-10-CM | POA: Diagnosis not present

## 2021-08-24 MED ORDER — TIRZEPATIDE 5 MG/0.5ML ~~LOC~~ SOAJ
5.0000 mg | SUBCUTANEOUS | 0 refills | Status: DC
  Filled 2021-08-24: qty 6, 84d supply, fill #0

## 2021-08-24 NOTE — Telephone Encounter (Signed)
Noted  

## 2021-08-24 NOTE — Progress Notes (Signed)
   Subjective:    Patient ID: Lauren Espinoza, female    DOB: June 30, 1970, 51 y.o.   MRN: [id number]  HPI Here to follow up after starting on Mounjaro 4 weeks ago. This was on the 2.5 mg dose. She feels great and her glucoses are the best they have ever been (am fasting values have been running from 90 to110). She has lost 5 lbs.    Review of Systems  Constitutional: Negative.   Respiratory: Negative.    Cardiovascular: Negative.   Gastrointestinal: Negative.       Objective:   Physical Exam Constitutional:      Appearance: Normal appearance.  Cardiovascular:     Rate and Rhythm: Normal rate and regular rhythm.     Pulses: Normal pulses.     Heart sounds: Normal heart sounds.  Pulmonary:     Effort: Pulmonary effort is normal.     Breath sounds: Normal breath sounds.  Neurological:     Mental Status: She is alert.          Assessment & Plan:  For her diabetes and obesity, the Greggory Keen has worked well. We will increase the dose to 5 mg weekly and she will check back in 4 weeks.  Gershon Crane, MD

## 2021-08-25 ENCOUNTER — Encounter: Payer: Self-pay | Admitting: Family Medicine

## 2021-08-25 ENCOUNTER — Other Ambulatory Visit (HOSPITAL_COMMUNITY): Payer: Self-pay

## 2021-08-29 ENCOUNTER — Ambulatory Visit: Payer: BC Managed Care – PPO | Admitting: Family Medicine

## 2021-08-30 ENCOUNTER — Other Ambulatory Visit: Payer: Self-pay | Admitting: Family Medicine

## 2021-08-31 ENCOUNTER — Other Ambulatory Visit (HOSPITAL_COMMUNITY): Payer: Self-pay

## 2021-08-31 MED ORDER — ATORVASTATIN CALCIUM 40 MG PO TABS
ORAL_TABLET | Freq: Every day | ORAL | 0 refills | Status: DC
  Filled 2021-08-31: qty 90, 90d supply, fill #0

## 2021-08-31 MED ORDER — LEVOTHYROXINE SODIUM 75 MCG PO TABS
ORAL_TABLET | Freq: Every day | ORAL | 0 refills | Status: DC
  Filled 2021-08-31: qty 90, 90d supply, fill #0

## 2021-09-01 ENCOUNTER — Other Ambulatory Visit (HOSPITAL_COMMUNITY): Payer: Self-pay

## 2021-09-01 MED ORDER — CEPHALEXIN 500 MG PO CAPS
500.0000 mg | ORAL_CAPSULE | Freq: Two times a day (BID) | ORAL | 0 refills | Status: DC
  Filled 2021-09-01: qty 20, 10d supply, fill #0

## 2021-09-26 ENCOUNTER — Encounter: Payer: Self-pay | Admitting: Family Medicine

## 2021-09-26 ENCOUNTER — Other Ambulatory Visit (HOSPITAL_COMMUNITY): Payer: Self-pay

## 2021-09-26 ENCOUNTER — Other Ambulatory Visit: Payer: Self-pay | Admitting: Family Medicine

## 2021-09-27 ENCOUNTER — Other Ambulatory Visit (HOSPITAL_COMMUNITY): Payer: Self-pay

## 2021-09-27 MED ORDER — ATORVASTATIN CALCIUM 40 MG PO TABS
40.0000 mg | ORAL_TABLET | Freq: Every day | ORAL | 3 refills | Status: DC
  Filled 2021-09-27 – 2021-10-19 (×3): qty 90, 90d supply, fill #0

## 2021-09-27 MED ORDER — TIRZEPATIDE 7.5 MG/0.5ML ~~LOC~~ SOAJ
7.5000 mg | SUBCUTANEOUS | 0 refills | Status: DC
Start: 2021-09-27 — End: 2021-10-19
  Filled 2021-09-27: qty 2, 28d supply, fill #0

## 2021-09-27 MED ORDER — PHENOBARBITAL 97.2 MG PO TABS
ORAL_TABLET | Freq: Two times a day (BID) | ORAL | 1 refills | Status: DC
  Filled 2021-09-27: qty 180, fill #0
  Filled 2021-10-03: qty 180, 90d supply, fill #0
  Filled 2022-01-06: qty 180, 90d supply, fill #1

## 2021-09-27 MED ORDER — LEVOTHYROXINE SODIUM 75 MCG PO TABS
ORAL_TABLET | Freq: Every day | ORAL | 3 refills | Status: DC
Start: 2021-09-27 — End: 2021-09-28

## 2021-09-27 NOTE — Telephone Encounter (Signed)
Last refill phenobarbital- 04/08/2021---180 tabs, 1 refill  Last OV-08/24/2021  Next OV-11/16/21

## 2021-09-27 NOTE — Telephone Encounter (Signed)
We can go up to 7.5 mg now. I sent in a RX for this for 30 days

## 2021-09-28 ENCOUNTER — Other Ambulatory Visit (HOSPITAL_COMMUNITY): Payer: Self-pay

## 2021-09-28 ENCOUNTER — Other Ambulatory Visit: Payer: Self-pay

## 2021-09-28 MED ORDER — LEVOTHYROXINE SODIUM 75 MCG PO TABS
ORAL_TABLET | Freq: Every day | ORAL | 3 refills | Status: DC
  Filled 2021-09-28: qty 90, fill #0

## 2021-10-03 ENCOUNTER — Other Ambulatory Visit (HOSPITAL_COMMUNITY): Payer: Self-pay

## 2021-10-04 ENCOUNTER — Other Ambulatory Visit (HOSPITAL_COMMUNITY): Payer: Self-pay

## 2021-10-17 ENCOUNTER — Telehealth: Payer: BC Managed Care – PPO | Admitting: Physician Assistant

## 2021-10-17 DIAGNOSIS — R3989 Other symptoms and signs involving the genitourinary system: Secondary | ICD-10-CM | POA: Diagnosis not present

## 2021-10-17 MED ORDER — CEPHALEXIN 500 MG PO CAPS: 500.0000 mg | ORAL_CAPSULE | Freq: Two times a day (BID) | ORAL | 0 refills | Status: DC

## 2021-10-17 NOTE — Progress Notes (Signed)

## 2021-10-19 ENCOUNTER — Other Ambulatory Visit (HOSPITAL_COMMUNITY): Payer: Self-pay

## 2021-10-19 ENCOUNTER — Other Ambulatory Visit: Payer: Self-pay | Admitting: Family Medicine

## 2021-10-20 NOTE — Telephone Encounter (Signed)
On 09/26/21, patient message.   Patient was to began 7.5 mg injection for 30 days.  Will dosage increase or okay for refill?

## 2021-10-21 ENCOUNTER — Other Ambulatory Visit (HOSPITAL_COMMUNITY): Payer: Self-pay

## 2021-10-21 MED ORDER — MOUNJARO 7.5 MG/0.5ML ~~LOC~~ SOAJ
7.5000 mg | SUBCUTANEOUS | 2 refills | Status: DC
  Filled 2021-10-21: qty 2, 28d supply, fill #0

## 2021-10-21 NOTE — Telephone Encounter (Signed)
Check with her. I assume she wants to go up a notch on the dose?

## 2021-10-21 NOTE — Telephone Encounter (Signed)
FYI Spoke with patient.   Patient stated that she will be going on an extended vacation and  at this time she wants to stay at current dosage.    Patient has OV appointment scheduled for 11/16/21, and will discuss dosage at that time.

## 2021-10-31 ENCOUNTER — Ambulatory Visit: Payer: BC Managed Care – PPO | Admitting: Family Medicine

## 2021-11-14 ENCOUNTER — Encounter: Payer: BC Managed Care – PPO | Admitting: Family Medicine

## 2021-11-16 ENCOUNTER — Encounter: Payer: BC Managed Care – PPO | Admitting: Family Medicine

## 2021-11-24 ENCOUNTER — Other Ambulatory Visit (HOSPITAL_COMMUNITY): Payer: Self-pay

## 2021-11-24 ENCOUNTER — Encounter: Payer: Self-pay | Admitting: Family Medicine

## 2021-11-24 ENCOUNTER — Ambulatory Visit (INDEPENDENT_AMBULATORY_CARE_PROVIDER_SITE_OTHER): Payer: BC Managed Care – PPO | Admitting: Family Medicine

## 2021-11-24 ENCOUNTER — Telehealth: Payer: Self-pay | Admitting: Family Medicine

## 2021-11-24 VITALS — BP 102/76 | HR 71 | Temp 98.3°F | Ht 69.0 in | Wt 273.0 lb

## 2021-11-24 DIAGNOSIS — E119 Type 2 diabetes mellitus without complications: Secondary | ICD-10-CM

## 2021-11-24 DIAGNOSIS — R569 Unspecified convulsions: Secondary | ICD-10-CM

## 2021-11-24 DIAGNOSIS — Z0000 Encounter for general adult medical examination without abnormal findings: Secondary | ICD-10-CM

## 2021-11-24 DIAGNOSIS — E039 Hypothyroidism, unspecified: Secondary | ICD-10-CM

## 2021-11-24 DIAGNOSIS — M10071 Idiopathic gout, right ankle and foot: Secondary | ICD-10-CM | POA: Diagnosis not present

## 2021-11-24 LAB — BASIC METABOLIC PANEL
BUN: 18 mg/dL (ref 6–23)
CO2: 30 mEq/L (ref 19–32)
Calcium: 9.4 mg/dL (ref 8.4–10.5)
Chloride: 100 mEq/L (ref 96–112)
Creatinine, Ser: 0.62 mg/dL (ref 0.40–1.20)
GFR: 105.09 mL/min (ref >60.00)
Glucose, Bld: 98 mg/dL (ref 70–99)
Potassium: 3.8 mEq/L (ref 3.5–5.1)
Sodium: 139 mEq/L (ref 135–145)

## 2021-11-24 LAB — CBC WITH DIFFERENTIAL/PLATELET
Basophils Absolute: 0 10*3/uL (ref 0.0–0.1)
Basophils Relative: 0.8 % (ref 0.0–3.0)
Eosinophils Absolute: 0.1 10*3/uL (ref 0.0–0.7)
Eosinophils Relative: 1.5 % (ref 0.0–5.0)
HCT: 41.3 % (ref 36.0–46.0)
Hemoglobin: 13.9 g/dL (ref 12.0–15.0)
Lymphocytes Relative: 28 % (ref 12.0–46.0)
Lymphs Abs: 1.8 10*3/uL (ref 0.7–4.0)
MCHC: 33.6 g/dL (ref 30.0–36.0)
MCV: 93.1 fl (ref 78.0–100.0)
Monocytes Absolute: 0.4 10*3/uL (ref 0.1–1.0)
Monocytes Relative: 7 % (ref 3.0–12.0)
Neutro Abs: 4 10*3/uL (ref 1.4–7.7)
Neutrophils Relative %: 62.7 % (ref 43.0–77.0)
Platelets: 284 10*3/uL (ref 150.0–400.0)
RBC: 4.44 Mil/uL (ref 3.87–5.11)
RDW: 13.1 % (ref 11.5–15.5)
WBC: 6.3 10*3/uL (ref 4.0–10.5)

## 2021-11-24 LAB — HEPATIC FUNCTION PANEL
ALT: 20 U/L (ref 0–35)
AST: 20 U/L (ref 0–37)
Albumin: 4.1 g/dL (ref 3.5–5.2)
Alkaline Phosphatase: 44 U/L (ref 39–117)
Bilirubin, Direct: 0.1 mg/dL (ref 0.0–0.3)
Total Bilirubin: 0.3 mg/dL (ref 0.2–1.2)
Total Protein: 7.1 g/dL (ref 6.0–8.3)

## 2021-11-24 LAB — HEMOGLOBIN A1C: Hgb A1c MFr Bld: 5.9 % (ref 4.6–6.5)

## 2021-11-24 LAB — LIPID PANEL
Cholesterol: 154 mg/dL (ref 0–200)
HDL: 50.9 mg/dL (ref >39.00)
LDL Cholesterol: 74 mg/dL (ref 0–99)
NonHDL: 102.84
Total CHOL/HDL Ratio: 3
Triglycerides: 144 mg/dL (ref 0.0–149.0)
VLDL: 28.8 mg/dL (ref 0.0–40.0)

## 2021-11-24 LAB — TSH: TSH: 1.91 u[IU]/mL (ref 0.35–5.50)

## 2021-11-24 LAB — T3, FREE: T3, Free: 3.4 pg/mL (ref 2.3–4.2)

## 2021-11-24 LAB — URIC ACID: Uric Acid, Serum: 5.1 mg/dL (ref 2.4–7.0)

## 2021-11-24 LAB — T4, FREE: Free T4: 0.69 ng/dL (ref 0.60–1.60)

## 2021-11-24 MED ORDER — ALLOPURINOL 300 MG PO TABS
300.0000 mg | ORAL_TABLET | Freq: Every day | ORAL | 3 refills | Status: DC
  Filled 2021-11-24 – 2021-12-13 (×2): qty 90, 90d supply, fill #0
  Filled 2022-03-23: qty 90, 90d supply, fill #1
  Filled 2022-07-06: qty 90, 90d supply, fill #2
  Filled 2022-09-26: qty 90, 90d supply, fill #3

## 2021-11-24 MED ORDER — HYDROCHLOROTHIAZIDE 25 MG PO TABS
25.0000 mg | ORAL_TABLET | Freq: Every day | ORAL | 3 refills | Status: DC
  Filled 2021-11-24: qty 90, fill #0
  Filled 2021-12-13 – 2022-01-06 (×2): qty 90, 90d supply, fill #0
  Filled 2022-03-31: qty 90, 90d supply, fill #1
  Filled 2022-07-06: qty 90, 90d supply, fill #2
  Filled 2022-09-26: qty 90, 90d supply, fill #3

## 2021-11-24 MED ORDER — LEVOTHYROXINE SODIUM 75 MCG PO TABS
75.0000 ug | ORAL_TABLET | Freq: Every day | ORAL | 3 refills | Status: DC
  Filled 2021-11-24: qty 90, 90d supply, fill #0
  Filled 2022-02-21: qty 90, 90d supply, fill #1
  Filled 2022-05-19: qty 30, 30d supply, fill #2
  Filled 2022-06-09: qty 90, 90d supply, fill #2
  Filled 2022-09-11: qty 90, 90d supply, fill #3

## 2021-11-24 MED ORDER — LISINOPRIL 5 MG PO TABS
5.0000 mg | ORAL_TABLET | Freq: Every day | ORAL | 3 refills | Status: DC
Start: 2021-11-24 — End: 2022-11-13
  Filled 2021-11-24: qty 90, 90d supply, fill #0
  Filled 2022-02-21: qty 90, 90d supply, fill #1
  Filled 2022-05-19: qty 90, 90d supply, fill #2
  Filled 2022-08-24: qty 90, 90d supply, fill #3

## 2021-11-24 MED ORDER — TIRZEPATIDE 10 MG/0.5ML ~~LOC~~ SOAJ
10.0000 mg | SUBCUTANEOUS | 5 refills | Status: DC
  Filled 2021-11-24: qty 2, 28d supply, fill #0
  Filled 2021-12-13 – 2021-12-15 (×2): qty 2, 28d supply, fill #1

## 2021-11-24 MED ORDER — ATORVASTATIN CALCIUM 40 MG PO TABS
40.0000 mg | ORAL_TABLET | Freq: Every day | ORAL | 3 refills | Status: DC
Start: 2021-11-24 — End: 2022-11-13
  Filled 2021-11-24: qty 90, 90d supply, fill #0
  Filled 2022-02-14: qty 90, 90d supply, fill #1
  Filled 2022-05-19: qty 90, 90d supply, fill #2
  Filled 2022-08-16: qty 90, 90d supply, fill #3

## 2021-11-24 MED ORDER — FLUOXETINE HCL 20 MG PO CAPS
20.0000 mg | ORAL_CAPSULE | Freq: Every day | ORAL | 4 refills | Status: DC
Start: 2021-11-24 — End: 2022-11-27
  Filled 2021-11-24: qty 90, 90d supply, fill #0
  Filled 2022-02-21 – 2022-02-23 (×2): qty 90, 90d supply, fill #1
  Filled 2022-05-19: qty 90, 90d supply, fill #2
  Filled 2022-08-16: qty 90, 90d supply, fill #3
  Filled 2022-11-13: qty 90, 90d supply, fill #4

## 2021-11-24 NOTE — Progress Notes (Signed)
   Subjective:    Patient ID: Lauren Espinoza, female    DOB: Sep 08, 1970, 51 y.o.   MRN: [id number]  HPI Here for a well exam. She is doing well in general. She has lost another 2 lbs in the past 3 months. She is pleased with [facility] and she is ready to increase the dose.    Review of Systems  Constitutional: Negative.   HENT: Negative.    Eyes: Negative.   Respiratory: Negative.    Cardiovascular: Negative.   Gastrointestinal: Negative.   Genitourinary:  Negative for decreased urine volume, difficulty urinating, dyspareunia, dysuria, enuresis, flank pain, frequency, hematuria, pelvic pain and urgency.  Musculoskeletal: Negative.   Skin: Negative.   Neurological: Negative.  Negative for headaches.  Psychiatric/Behavioral: Negative.         Objective:   Physical Exam Constitutional:      General: She is not in acute distress.    Appearance: She is well-developed. She is obese.  HENT:     Head: Normocephalic and atraumatic.     Right Ear: External ear normal.     Left Ear: External ear normal.     Nose: Nose normal.     Mouth/Throat:     Pharynx: No oropharyngeal exudate.  Eyes:     General: No scleral icterus.    Conjunctiva/sclera: Conjunctivae normal.     Pupils: Pupils are equal, round, and reactive to light.  Neck:     Thyroid: No thyromegaly.     Vascular: No JVD.  Cardiovascular:     Rate and Rhythm: Normal rate and regular rhythm.     Heart sounds: Normal heart sounds. No murmur heard.    No friction rub. No gallop.  Pulmonary:     Effort: Pulmonary effort is normal. No respiratory distress.     Breath sounds: Normal breath sounds. No wheezing or rales.  Chest:     Chest wall: No tenderness.  Abdominal:     General: Bowel sounds are normal. There is no distension.     Palpations: Abdomen is soft. There is no mass.     Tenderness: There is no abdominal tenderness. There is no guarding or rebound.  Musculoskeletal:        General: No tenderness. Normal  range of motion.     Cervical back: Normal range of motion and neck supple.  Lymphadenopathy:     Cervical: No cervical adenopathy.  Skin:    General: Skin is warm and dry.     Findings: No erythema or rash.  Neurological:     Mental Status: She is alert and oriented to person, place, and time.     Cranial Nerves: No cranial nerve deficit.     Motor: No abnormal muscle tone.     Coordination: Coordination normal.     Deep Tendon Reflexes: Reflexes are normal and symmetric. Reflexes normal.  Psychiatric:        Behavior: Behavior normal.        Thought Content: Thought content normal.        Judgment: Judgment normal.           Assessment & Plan:  Well exam. We discussed diet and exercise. Get fasting labs. We will increase the Mounjaro to 10 mg weekly.  Gershon Crane, MD

## 2021-11-24 NOTE — Telephone Encounter (Signed)
[facility] spoke with Arlys John, he stated that previous manufacturer of Levothyroxine, pharmacy no longer uses.      Arlys John stated that patient will receive Levothyroxine from a different manufacturer.   Pharmacy have to inform doctor of switching to different manufacturer for medications.

## 2021-11-24 NOTE — Telephone Encounter (Signed)
Pharmacy wants patients to all be on the same generic thyroid medication, Levothyroxine and seeking permission from provider

## 2021-11-25 ENCOUNTER — Other Ambulatory Visit (HOSPITAL_COMMUNITY): Payer: Self-pay

## 2021-11-25 LAB — PHENOBARBITAL LEVEL: Phenobarbital, Serum: 27.2 mg/L (ref 15.0–40.0)

## 2021-11-25 NOTE — Telephone Encounter (Addendum)
Pharmacy aware

## 2021-11-25 NOTE — Telephone Encounter (Signed)
Yes that would be fine with me.

## 2021-12-03 ENCOUNTER — Other Ambulatory Visit (HOSPITAL_COMMUNITY): Payer: Self-pay

## 2021-12-13 ENCOUNTER — Other Ambulatory Visit (HOSPITAL_COMMUNITY): Payer: Self-pay

## 2021-12-19 ENCOUNTER — Encounter: Payer: Self-pay | Admitting: Family Medicine

## 2021-12-19 ENCOUNTER — Other Ambulatory Visit (HOSPITAL_COMMUNITY): Payer: Self-pay

## 2021-12-20 ENCOUNTER — Other Ambulatory Visit (HOSPITAL_COMMUNITY): Payer: Self-pay

## 2021-12-20 MED ORDER — TIRZEPATIDE 12.5 MG/0.5ML ~~LOC~~ SOAJ
12.5000 mg | SUBCUTANEOUS | 5 refills | Status: DC
  Filled 2021-12-20: qty 6, 84d supply, fill #0
  Filled 2022-02-27: qty 2, 28d supply, fill #1
  Filled 2022-03-31: qty 2, 28d supply, fill #2
  Filled 2022-05-04 – 2022-05-15 (×2): qty 2, 28d supply, fill #3
  Filled 2022-05-22 – 2022-06-06 (×2): qty 2, 28d supply, fill #4
  Filled 2022-07-05: qty 2, 28d supply, fill #5

## 2021-12-20 NOTE — Telephone Encounter (Signed)
I sent in the 12.5 mg dose

## 2021-12-23 ENCOUNTER — Other Ambulatory Visit (HOSPITAL_COMMUNITY): Payer: Self-pay

## 2022-01-06 ENCOUNTER — Other Ambulatory Visit (HOSPITAL_COMMUNITY): Payer: Self-pay

## 2022-01-09 ENCOUNTER — Other Ambulatory Visit (HOSPITAL_COMMUNITY): Payer: Self-pay

## 2022-01-25 ENCOUNTER — Encounter: Payer: Self-pay | Admitting: Family Medicine

## 2022-01-26 NOTE — Telephone Encounter (Signed)
If she is thinking about changing the medication, she should see Neurology to handle that. I can refer her if needed

## 2022-02-14 ENCOUNTER — Other Ambulatory Visit (HOSPITAL_COMMUNITY): Payer: Self-pay

## 2022-02-21 ENCOUNTER — Other Ambulatory Visit (HOSPITAL_COMMUNITY): Payer: Self-pay

## 2022-02-22 ENCOUNTER — Other Ambulatory Visit (HOSPITAL_COMMUNITY): Payer: Self-pay

## 2022-02-23 ENCOUNTER — Other Ambulatory Visit (HOSPITAL_COMMUNITY): Payer: Self-pay

## 2022-02-27 ENCOUNTER — Other Ambulatory Visit (HOSPITAL_COMMUNITY): Payer: Self-pay

## 2022-03-10 ENCOUNTER — Telehealth: Payer: BC Managed Care – PPO | Admitting: Physician Assistant

## 2022-03-10 ENCOUNTER — Other Ambulatory Visit (HOSPITAL_COMMUNITY): Payer: Self-pay

## 2022-03-10 DIAGNOSIS — R3989 Other symptoms and signs involving the genitourinary system: Secondary | ICD-10-CM | POA: Diagnosis not present

## 2022-03-10 MED ORDER — CEPHALEXIN 500 MG PO CAPS
500.0000 mg | ORAL_CAPSULE | Freq: Two times a day (BID) | ORAL | 0 refills | Status: DC
  Filled 2022-03-10: qty 14, 7d supply, fill #0

## 2022-03-10 NOTE — Progress Notes (Signed)

## 2022-03-23 ENCOUNTER — Other Ambulatory Visit (HOSPITAL_COMMUNITY): Payer: Self-pay

## 2022-03-24 ENCOUNTER — Other Ambulatory Visit: Payer: Self-pay

## 2022-03-27 LAB — HM DIABETES EYE EXAM

## 2022-03-30 ENCOUNTER — Encounter: Payer: Self-pay | Admitting: Family Medicine

## 2022-03-31 ENCOUNTER — Other Ambulatory Visit: Payer: Self-pay | Admitting: Family Medicine

## 2022-03-31 ENCOUNTER — Other Ambulatory Visit: Payer: Self-pay

## 2022-03-31 ENCOUNTER — Other Ambulatory Visit (HOSPITAL_COMMUNITY): Payer: Self-pay

## 2022-03-31 MED ORDER — PHENOBARBITAL 97.2 MG PO TABS: ORAL_TABLET | Freq: Two times a day (BID) | ORAL | 0 refills | Status: DC

## 2022-03-31 NOTE — Telephone Encounter (Signed)
Contract: n/a UDS: n/a Last Visit: 11/24/21 Next Visit: n/a Last Refill: 09/26/21 Phenobarbital level: 27.2 on 11/24/21  Please Advise

## 2022-04-01 ENCOUNTER — Other Ambulatory Visit: Payer: Self-pay

## 2022-04-17 ENCOUNTER — Other Ambulatory Visit: Payer: Self-pay | Admitting: Family Medicine

## 2022-04-17 ENCOUNTER — Telehealth: Payer: Self-pay | Admitting: Family Medicine

## 2022-04-17 ENCOUNTER — Encounter: Payer: Self-pay | Admitting: Family Medicine

## 2022-04-17 ENCOUNTER — Other Ambulatory Visit (HOSPITAL_COMMUNITY): Payer: Self-pay

## 2022-04-17 ENCOUNTER — Other Ambulatory Visit: Payer: Self-pay

## 2022-04-17 MED ORDER — PHENOBARBITAL 97.2 MG PO TABS: ORAL_TABLET | Freq: Two times a day (BID) | ORAL | 0 refills | Status: DC

## 2022-04-17 NOTE — Telephone Encounter (Signed)
.  Prescription Request  04/17/2022  Is this a "Controlled Substance" medicine? No  LOV: 11/24/2021  What is the name of the medication or equipment? PHENobarbital (LUMINAL) 97.2 MG tablet   Have you contacted your pharmacy to request a refill? Yes   Which pharmacy would you like this sent to?  [facility] [place] [address] Phone: [phone] Fax: [phone]    Patient notified that their request is being sent to the clinical staff for review and that they should receive a response within 2 business days.   Please advise at Mobile [phone] (mobile) n

## 2022-04-17 NOTE — Telephone Encounter (Signed)
Pt Rx was printed but not in any of the office printer, Rx was called in to pt pharmacy

## 2022-04-17 NOTE — Telephone Encounter (Signed)
The rx was printed and never sent to [facility]

## 2022-04-18 ENCOUNTER — Other Ambulatory Visit: Payer: Self-pay

## 2022-04-18 ENCOUNTER — Other Ambulatory Visit (HOSPITAL_COMMUNITY): Payer: Self-pay

## 2022-04-18 MED ORDER — PHENOBARBITAL 97.2 MG PO TABS
97.2000 mg | ORAL_TABLET | Freq: Two times a day (BID) | ORAL | 0 refills | Status: DC
  Filled 2022-04-18: qty 137, 68d supply, fill #0
  Filled 2022-04-18: qty 43, 22d supply, fill #0
  Filled 2022-04-18: qty 48, 24d supply, fill #0
  Filled 2022-04-18: qty 43, 22d supply, fill #0

## 2022-04-18 MED ORDER — PHENOBARBITAL 97.2 MG PO TABS
97.2000 mg | ORAL_TABLET | Freq: Two times a day (BID) | ORAL | 1 refills | Status: DC
  Filled 2022-04-18 – 2022-07-17 (×3): qty 180, 90d supply, fill #0

## 2022-04-18 NOTE — Telephone Encounter (Signed)
I sent this in again

## 2022-04-19 ENCOUNTER — Other Ambulatory Visit (HOSPITAL_COMMUNITY): Payer: Self-pay

## 2022-04-19 ENCOUNTER — Other Ambulatory Visit: Payer: Self-pay

## 2022-04-19 DIAGNOSIS — Z01419 Encounter for gynecological examination (general) (routine) without abnormal findings: Secondary | ICD-10-CM | POA: Diagnosis not present

## 2022-04-19 DIAGNOSIS — R350 Frequency of micturition: Secondary | ICD-10-CM | POA: Diagnosis not present

## 2022-04-19 DIAGNOSIS — Z1231 Encounter for screening mammogram for malignant neoplasm of breast: Secondary | ICD-10-CM | POA: Diagnosis not present

## 2022-04-19 MED ORDER — FLUOXETINE HCL 20 MG PO CAPS
20.0000 mg | ORAL_CAPSULE | Freq: Every day | ORAL | 4 refills | Status: DC
  Filled 2022-04-19 – 2023-03-26 (×2): qty 90, 90d supply, fill #0

## 2022-04-21 ENCOUNTER — Other Ambulatory Visit (HOSPITAL_COMMUNITY): Payer: Self-pay

## 2022-05-13 ENCOUNTER — Other Ambulatory Visit (HOSPITAL_COMMUNITY): Payer: Self-pay

## 2022-05-15 ENCOUNTER — Other Ambulatory Visit (HOSPITAL_COMMUNITY): Payer: Self-pay

## 2022-05-20 ENCOUNTER — Other Ambulatory Visit (HOSPITAL_BASED_OUTPATIENT_CLINIC_OR_DEPARTMENT_OTHER): Payer: Self-pay

## 2022-05-20 ENCOUNTER — Other Ambulatory Visit (HOSPITAL_COMMUNITY): Payer: Self-pay

## 2022-05-22 ENCOUNTER — Other Ambulatory Visit (HOSPITAL_COMMUNITY): Payer: Self-pay

## 2022-05-22 ENCOUNTER — Other Ambulatory Visit: Payer: Self-pay

## 2022-05-23 ENCOUNTER — Other Ambulatory Visit (HOSPITAL_COMMUNITY): Payer: Self-pay

## 2022-06-01 ENCOUNTER — Other Ambulatory Visit (HOSPITAL_COMMUNITY): Payer: Self-pay

## 2022-06-05 ENCOUNTER — Other Ambulatory Visit (HOSPITAL_BASED_OUTPATIENT_CLINIC_OR_DEPARTMENT_OTHER): Payer: Self-pay

## 2022-06-05 ENCOUNTER — Other Ambulatory Visit (HOSPITAL_COMMUNITY): Payer: Self-pay

## 2022-06-06 ENCOUNTER — Other Ambulatory Visit: Payer: Self-pay

## 2022-06-06 ENCOUNTER — Other Ambulatory Visit (HOSPITAL_COMMUNITY): Payer: Self-pay

## 2022-06-26 ENCOUNTER — Encounter: Payer: Self-pay | Admitting: Family Medicine

## 2022-06-28 ENCOUNTER — Other Ambulatory Visit (HOSPITAL_COMMUNITY): Payer: Self-pay

## 2022-06-28 MED ORDER — TIRZEPATIDE 15 MG/0.5ML ~~LOC~~ SOAJ
15.0000 mg | SUBCUTANEOUS | 0 refills | Status: DC
  Filled 2022-06-28 – 2022-07-26 (×3): qty 2, 28d supply, fill #0
  Filled 2022-08-15 – 2022-08-16 (×2): qty 2, 28d supply, fill #1
  Filled 2022-09-11: qty 2, 28d supply, fill #2
  Filled 2022-10-05 – 2022-10-11 (×3): qty 2, 28d supply, fill #3
  Filled 2022-11-06: qty 2, 28d supply, fill #4
  Filled 2022-11-30 – 2022-12-06 (×3): qty 2, 28d supply, fill #5
  Filled 2022-12-25 – 2023-01-03 (×2): qty 2, 28d supply, fill #6
  Filled 2023-01-27: qty 2, 28d supply, fill #7
  Filled 2023-03-05: qty 2, 28d supply, fill #8

## 2022-06-28 NOTE — Telephone Encounter (Signed)
Done

## 2022-07-03 ENCOUNTER — Other Ambulatory Visit (HOSPITAL_COMMUNITY): Payer: Self-pay

## 2022-07-03 ENCOUNTER — Other Ambulatory Visit: Payer: Self-pay | Admitting: Family Medicine

## 2022-07-03 ENCOUNTER — Other Ambulatory Visit: Payer: Self-pay

## 2022-07-05 ENCOUNTER — Other Ambulatory Visit (HOSPITAL_COMMUNITY): Payer: Self-pay

## 2022-07-07 ENCOUNTER — Other Ambulatory Visit (HOSPITAL_COMMUNITY): Payer: Self-pay

## 2022-07-12 ENCOUNTER — Other Ambulatory Visit (HOSPITAL_COMMUNITY): Payer: Self-pay

## 2022-07-18 ENCOUNTER — Other Ambulatory Visit (HOSPITAL_COMMUNITY): Payer: Self-pay

## 2022-07-18 ENCOUNTER — Other Ambulatory Visit: Payer: Self-pay

## 2022-07-20 ENCOUNTER — Other Ambulatory Visit: Payer: Self-pay

## 2022-07-20 ENCOUNTER — Other Ambulatory Visit (HOSPITAL_COMMUNITY): Payer: Self-pay

## 2022-07-20 ENCOUNTER — Telehealth: Payer: BC Managed Care – PPO | Admitting: Physician Assistant

## 2022-07-20 DIAGNOSIS — J019 Acute sinusitis, unspecified: Secondary | ICD-10-CM | POA: Diagnosis not present

## 2022-07-20 DIAGNOSIS — B9689 Other specified bacterial agents as the cause of diseases classified elsewhere: Secondary | ICD-10-CM | POA: Diagnosis not present

## 2022-07-20 DIAGNOSIS — H6123 Impacted cerumen, bilateral: Secondary | ICD-10-CM | POA: Diagnosis not present

## 2022-07-20 MED ORDER — AMOXICILLIN-POT CLAVULANATE 875-125 MG PO TABS
1.0000 | ORAL_TABLET | Freq: Two times a day (BID) | ORAL | 0 refills | Status: DC
Start: 2022-07-20 — End: 2022-11-27
  Filled 2022-07-20 (×2): qty 14, 7d supply, fill #0

## 2022-07-20 NOTE — Progress Notes (Signed)

## 2022-07-20 NOTE — Progress Notes (Signed)
I have spent 5 minutes in review of e-visit questionnaire, review and updating patient chart, medical decision making and response to patient.   Khayla Koppenhaver Cody Tanina Barb, PA-C    

## 2022-07-25 ENCOUNTER — Other Ambulatory Visit (HOSPITAL_COMMUNITY): Payer: Self-pay

## 2022-07-26 ENCOUNTER — Other Ambulatory Visit (HOSPITAL_COMMUNITY): Payer: Self-pay

## 2022-07-26 ENCOUNTER — Other Ambulatory Visit: Payer: Self-pay

## 2022-08-15 ENCOUNTER — Other Ambulatory Visit (HOSPITAL_COMMUNITY): Payer: Self-pay

## 2022-08-16 ENCOUNTER — Other Ambulatory Visit: Payer: Self-pay

## 2022-08-24 ENCOUNTER — Other Ambulatory Visit: Payer: Self-pay

## 2022-09-05 ENCOUNTER — Other Ambulatory Visit (HOSPITAL_COMMUNITY): Payer: Self-pay

## 2022-09-11 ENCOUNTER — Other Ambulatory Visit: Payer: Self-pay

## 2022-09-11 ENCOUNTER — Other Ambulatory Visit (HOSPITAL_COMMUNITY): Payer: Self-pay

## 2022-09-14 ENCOUNTER — Other Ambulatory Visit: Payer: Self-pay

## 2022-09-26 ENCOUNTER — Other Ambulatory Visit (HOSPITAL_COMMUNITY): Payer: Self-pay

## 2022-10-05 ENCOUNTER — Other Ambulatory Visit (HOSPITAL_COMMUNITY): Payer: Self-pay

## 2022-10-10 ENCOUNTER — Other Ambulatory Visit (HOSPITAL_COMMUNITY): Payer: Self-pay

## 2022-10-11 ENCOUNTER — Other Ambulatory Visit (HOSPITAL_COMMUNITY): Payer: Self-pay

## 2022-10-23 ENCOUNTER — Other Ambulatory Visit: Payer: Self-pay | Admitting: Family Medicine

## 2022-10-23 ENCOUNTER — Other Ambulatory Visit: Payer: Self-pay

## 2022-10-23 ENCOUNTER — Other Ambulatory Visit (HOSPITAL_COMMUNITY): Payer: Self-pay

## 2022-10-23 ENCOUNTER — Encounter: Payer: Self-pay | Admitting: Family Medicine

## 2022-10-23 MED ORDER — PHENOBARBITAL 97.2 MG PO TABS
97.2000 mg | ORAL_TABLET | Freq: Two times a day (BID) | ORAL | 1 refills | Status: DC
  Filled 2022-10-23: qty 180, 90d supply, fill #0
  Filled 2023-01-22: qty 180, 90d supply, fill #1

## 2022-10-23 NOTE — Telephone Encounter (Signed)
I did that earlier today

## 2022-10-23 NOTE — Telephone Encounter (Signed)
Pt LOV was on 11/24/21 Last refill was done on 04/17/22 Please advise

## 2022-10-23 NOTE — Telephone Encounter (Signed)
Pt is calling checking on the below message

## 2022-10-24 ENCOUNTER — Other Ambulatory Visit (HOSPITAL_COMMUNITY): Payer: Self-pay

## 2022-11-02 ENCOUNTER — Encounter (INDEPENDENT_AMBULATORY_CARE_PROVIDER_SITE_OTHER): Payer: Self-pay

## 2022-11-06 ENCOUNTER — Other Ambulatory Visit (HOSPITAL_COMMUNITY): Payer: Self-pay

## 2022-11-13 ENCOUNTER — Other Ambulatory Visit: Payer: Self-pay | Admitting: Family Medicine

## 2022-11-13 ENCOUNTER — Other Ambulatory Visit (HOSPITAL_COMMUNITY): Payer: Self-pay

## 2022-11-14 ENCOUNTER — Other Ambulatory Visit: Payer: Self-pay

## 2022-11-15 ENCOUNTER — Other Ambulatory Visit (HOSPITAL_COMMUNITY): Payer: Self-pay

## 2022-11-15 MED ORDER — LEVOTHYROXINE SODIUM 75 MCG PO TABS
75.0000 ug | ORAL_TABLET | Freq: Every day | ORAL | 3 refills | Status: DC
  Filled 2022-11-15 – 2022-11-30 (×2): qty 90, 90d supply, fill #0
  Filled 2023-03-05: qty 90, 90d supply, fill #1
  Filled 2023-06-05: qty 90, 90d supply, fill #2
  Filled 2023-09-04: qty 90, 90d supply, fill #3

## 2022-11-15 MED ORDER — LISINOPRIL 5 MG PO TABS
5.0000 mg | ORAL_TABLET | Freq: Every day | ORAL | 3 refills | Status: DC
  Filled 2022-11-15: qty 90, 90d supply, fill #0
  Filled 2023-02-13: qty 90, 90d supply, fill #1
  Filled 2023-05-15: qty 90, 90d supply, fill #2
  Filled 2023-08-16: qty 90, 90d supply, fill #3

## 2022-11-15 MED ORDER — ATORVASTATIN CALCIUM 40 MG PO TABS
40.0000 mg | ORAL_TABLET | Freq: Every day | ORAL | 3 refills | Status: DC
  Filled 2022-11-15: qty 90, 90d supply, fill #0
  Filled 2023-02-13: qty 90, 90d supply, fill #1
  Filled 2023-05-15: qty 90, 90d supply, fill #2
  Filled 2023-08-16: qty 90, 90d supply, fill #3

## 2022-11-16 ENCOUNTER — Other Ambulatory Visit: Payer: Self-pay

## 2022-11-16 ENCOUNTER — Other Ambulatory Visit (HOSPITAL_COMMUNITY): Payer: Self-pay

## 2022-11-27 ENCOUNTER — Encounter: Payer: Self-pay | Admitting: Family Medicine

## 2022-11-27 ENCOUNTER — Other Ambulatory Visit (HOSPITAL_COMMUNITY): Payer: Self-pay

## 2022-11-27 ENCOUNTER — Ambulatory Visit (INDEPENDENT_AMBULATORY_CARE_PROVIDER_SITE_OTHER): Payer: BC Managed Care – PPO | Admitting: Family Medicine

## 2022-11-27 VITALS — BP 96/70 | HR 82 | Temp 98.3°F | Ht 69.0 in | Wt 273.0 lb

## 2022-11-27 DIAGNOSIS — R569 Unspecified convulsions: Secondary | ICD-10-CM

## 2022-11-27 DIAGNOSIS — Z23 Encounter for immunization: Secondary | ICD-10-CM | POA: Diagnosis not present

## 2022-11-27 DIAGNOSIS — M10071 Idiopathic gout, right ankle and foot: Secondary | ICD-10-CM

## 2022-11-27 DIAGNOSIS — E039 Hypothyroidism, unspecified: Secondary | ICD-10-CM

## 2022-11-27 DIAGNOSIS — Z0000 Encounter for general adult medical examination without abnormal findings: Secondary | ICD-10-CM | POA: Diagnosis not present

## 2022-11-27 DIAGNOSIS — I83891 Varicose veins of right lower extremities with other complications: Secondary | ICD-10-CM | POA: Insufficient documentation

## 2022-11-27 LAB — CBC WITH DIFFERENTIAL/PLATELET
Basophils Absolute: 0 10*3/uL (ref 0.0–0.1)
Basophils Relative: 0.6 % (ref 0.0–3.0)
Eosinophils Absolute: 0.1 10*3/uL (ref 0.0–0.7)
Eosinophils Relative: 1.5 % (ref 0.0–5.0)
HCT: 42.3 % (ref 36.0–46.0)
Hemoglobin: 14 g/dL (ref 12.0–15.0)
Lymphocytes Relative: 26.3 % (ref 12.0–46.0)
Lymphs Abs: 1.9 10*3/uL (ref 0.7–4.0)
MCHC: 33 g/dL (ref 30.0–36.0)
MCV: 94.8 fl (ref 78.0–100.0)
Monocytes Absolute: 0.5 10*3/uL (ref 0.1–1.0)
Monocytes Relative: 7.1 % (ref 3.0–12.0)
Neutro Abs: 4.7 10*3/uL (ref 1.4–7.7)
Neutrophils Relative %: 64.5 % (ref 43.0–77.0)
Platelets: 292 10*3/uL (ref 150.0–400.0)
RBC: 4.46 Mil/uL (ref 3.87–5.11)
RDW: 13 % (ref 11.5–15.5)
WBC: 7.3 10*3/uL (ref 4.0–10.5)

## 2022-11-27 LAB — TSH: TSH: 1.7 u[IU]/mL (ref 0.35–5.50)

## 2022-11-27 LAB — T4, FREE: Free T4: 0.8 ng/dL (ref 0.60–1.60)

## 2022-11-27 LAB — BASIC METABOLIC PANEL
BUN: 15 mg/dL (ref 6–23)
CO2: 29 meq/L (ref 19–32)
Calcium: 8.8 mg/dL (ref 8.4–10.5)
Chloride: 100 meq/L (ref 96–112)
Creatinine, Ser: 0.63 mg/dL (ref 0.40–1.20)
GFR: 103.95 mL/min (ref >60.00)
Glucose, Bld: 109 mg/dL — ABNORMAL HIGH (ref 70–99)
Potassium: 3.4 meq/L — ABNORMAL LOW (ref 3.5–5.1)
Sodium: 140 meq/L (ref 135–145)

## 2022-11-27 LAB — HEPATIC FUNCTION PANEL
ALT: 23 U/L (ref 0–35)
AST: 19 U/L (ref 0–37)
Albumin: 3.8 g/dL (ref 3.5–5.2)
Alkaline Phosphatase: 50 U/L (ref 39–117)
Bilirubin, Direct: 0.1 mg/dL (ref 0.0–0.3)
Total Bilirubin: 0.3 mg/dL (ref 0.2–1.2)
Total Protein: 6.6 g/dL (ref 6.0–8.3)

## 2022-11-27 LAB — URIC ACID: Uric Acid, Serum: 4.9 mg/dL (ref 2.4–7.0)

## 2022-11-27 LAB — T3, FREE: T3, Free: 3 pg/mL (ref 2.3–4.2)

## 2022-11-27 LAB — LIPID PANEL
Cholesterol: 155 mg/dL (ref 0–200)
HDL: 50.1 mg/dL (ref >39.00)
LDL Cholesterol: 69 mg/dL (ref 0–99)
NonHDL: 104.46
Total CHOL/HDL Ratio: 3
Triglycerides: 175 mg/dL — ABNORMAL HIGH (ref 0.0–149.0)
VLDL: 35 mg/dL (ref 0.0–40.0)

## 2022-11-27 LAB — HEMOGLOBIN A1C: Hgb A1c MFr Bld: 5.7 % (ref 4.6–6.5)

## 2022-11-27 MED ORDER — ALLOPURINOL 300 MG PO TABS
300.0000 mg | ORAL_TABLET | Freq: Every day | ORAL | 3 refills | Status: DC
Start: 2022-11-27 — End: 2023-11-28
  Filled 2022-11-27 – 2022-12-25 (×2): qty 90, 90d supply, fill #0
  Filled 2023-03-22: qty 90, 90d supply, fill #1
  Filled 2023-06-18: qty 90, 90d supply, fill #2
  Filled 2023-08-16 – 2023-09-11 (×2): qty 90, 90d supply, fill #3

## 2022-11-27 MED ORDER — HYDROCHLOROTHIAZIDE 25 MG PO TABS
25.0000 mg | ORAL_TABLET | Freq: Every day | ORAL | 3 refills | Status: DC
  Filled 2022-11-27 – 2022-12-25 (×2): qty 90, 90d supply, fill #0
  Filled 2023-03-22: qty 90, 90d supply, fill #1
  Filled 2023-06-18: qty 90, 90d supply, fill #2
  Filled 2023-08-16 – 2023-10-08 (×2): qty 90, 90d supply, fill #3

## 2022-11-27 NOTE — Addendum Note (Signed)
Addended by: Carola Rhine on: 11/27/2022 05:20 PM   Modules accepted: Orders

## 2022-11-27 NOTE — Progress Notes (Signed)
Subjective:    Patient ID: Lauren Espinoza, female    DOB: 1971/01/07, 52 y.o.   MRN: [id number]  HPI Here for a well exam. She is doing well in general. She is using Mounjaro, and she has lost about 20 lbs in the past few months. She tolerates this well. She has discomfort in the right leg from varicose veins, partly due to her hx of a DVT there.    Review of Systems  Constitutional: Negative.   HENT: Negative.    Eyes: Negative.   Respiratory: Negative.    Cardiovascular:  Positive for leg swelling.  Gastrointestinal: Negative.   Genitourinary:  Negative for decreased urine volume, difficulty urinating, dyspareunia, dysuria, enuresis, flank pain, frequency, hematuria, pelvic pain and urgency.  Musculoskeletal:  Positive for myalgias.  Skin: Negative.   Neurological: Negative.  Negative for headaches.  Psychiatric/Behavioral: Negative.         Objective:   Physical Exam Constitutional:      General: She is not in acute distress.    Appearance: She is well-developed. She is obese.  HENT:     Head: Normocephalic and atraumatic.     Right Ear: External ear normal.     Left Ear: External ear normal.     Nose: Nose normal.     Mouth/Throat:     Pharynx: No oropharyngeal exudate.  Eyes:     General: No scleral icterus.    Conjunctiva/sclera: Conjunctivae normal.     Pupils: Pupils are equal, round, and reactive to light.  Neck:     Thyroid: No thyromegaly.     Vascular: No JVD.  Cardiovascular:     Rate and Rhythm: Normal rate and regular rhythm.     Pulses: Normal pulses.     Heart sounds: Normal heart sounds. No murmur heard.    No friction rub. No gallop.  Pulmonary:     Effort: Pulmonary effort is normal. No respiratory distress.     Breath sounds: Normal breath sounds. No wheezing or rales.  Chest:     Chest wall: No tenderness.  Abdominal:     General: Bowel sounds are normal. There is no distension.     Palpations: Abdomen is soft. There is no mass.      Tenderness: There is no abdominal tenderness. There is no guarding or rebound.  Musculoskeletal:        General: No tenderness. Normal range of motion.     Cervical back: Normal range of motion and neck supple.  Lymphadenopathy:     Cervical: No cervical adenopathy.  Skin:    General: Skin is warm and dry.     Findings: No erythema or rash.  Neurological:     General: No focal deficit present.     Mental Status: She is alert and oriented to person, place, and time.     Cranial Nerves: No cranial nerve deficit.     Motor: No abnormal muscle tone.     Coordination: Coordination normal.     Deep Tendon Reflexes: Reflexes are normal and symmetric. Reflexes normal.  Psychiatric:        Mood and Affect: Mood normal.        Behavior: Behavior normal.        Thought Content: Thought content normal.        Judgment: Judgment normal.           Assessment & Plan:  Well exam. We discussed diet and exercise. Get fasting labs. She will stay  on [facility] for the time being. Refer to the [facility] for right leg varocose veins.  Gershon Crane, MD

## 2022-11-28 ENCOUNTER — Other Ambulatory Visit (HOSPITAL_COMMUNITY): Payer: Self-pay

## 2022-11-28 LAB — PHENOBARBITAL LEVEL: Phenobarbital, Serum: 25.3 mg/L (ref 15.0–40.0)

## 2022-11-29 ENCOUNTER — Other Ambulatory Visit (HOSPITAL_COMMUNITY): Payer: Self-pay

## 2022-11-29 ENCOUNTER — Other Ambulatory Visit: Payer: Self-pay

## 2022-11-29 DIAGNOSIS — E876 Hypokalemia: Secondary | ICD-10-CM

## 2022-11-29 MED ORDER — POTASSIUM CHLORIDE ER 10 MEQ PO TBCR
10.0000 meq | EXTENDED_RELEASE_TABLET | Freq: Every day | ORAL | 3 refills | Status: DC
  Filled 2022-11-29: qty 90, 90d supply, fill #0

## 2022-11-29 NOTE — Addendum Note (Signed)
Addended by: Carola Rhine on: 11/29/2022 01:51 PM   Modules accepted: Orders

## 2022-11-30 ENCOUNTER — Other Ambulatory Visit: Payer: Self-pay

## 2022-12-04 ENCOUNTER — Other Ambulatory Visit (HOSPITAL_COMMUNITY): Payer: Self-pay

## 2022-12-10 ENCOUNTER — Telehealth: Payer: BC Managed Care – PPO | Admitting: Family

## 2022-12-10 DIAGNOSIS — R399 Unspecified symptoms and signs involving the genitourinary system: Secondary | ICD-10-CM

## 2022-12-10 DIAGNOSIS — R109 Unspecified abdominal pain: Secondary | ICD-10-CM

## 2022-12-10 MED ORDER — CEPHALEXIN 500 MG PO CAPS
500.0000 mg | ORAL_CAPSULE | Freq: Two times a day (BID) | ORAL | 0 refills | Status: DC
Start: 2022-12-10 — End: 2023-01-08

## 2022-12-10 NOTE — Progress Notes (Signed)
Because of your UTI symptoms, fever, and back pain I feel your condition warrants further evaluation and I recommend that you be seen in a face to face visit.   NOTE: There will be NO CHARGE for this eVisit   If you are having a true medical emergency please call 911.      For an urgent face to face visit, Lauren Espinoza has eight urgent care centers for your convenience:   NEW!! [facility] Health Urgent Care Center at [facility] Get Driving Directions [phone] [address], [address] [place], [address]    [facility] Health Urgent Care Center at [facility] Get Driving Directions [phone] [address] [place], [place] [address]   [facility] Health Urgent Care Center [facility]) Get Driving Directions [phone] [address] [place], [place] [address]  [facility] Health Urgent Care Center [facility]) Get Driving Directions [phone] [address] [place],  [place]  [address]  [facility] Health Urgent Care Center [facility] - at [employer]  [phone] [address] [place],  [place] [address]   [facility] at [facility] Get Driving Directions [phone] [address], [address] [place], [place] [address]   [facility] Health Urgent Care at [facility] Get Driving Directions  [phone] [address] [place], [place] [address]   [facility] Health Urgent Care at [facility] Directions [phone] [address], [address] [place], [place] [address]  Your MyChart E-visit questionnaire answers were reviewed by a board certified advanced clinical practitioner to complete your personal care plan based on your specific symptoms.  Thank you for using e-Visits.

## 2022-12-10 NOTE — Progress Notes (Signed)
Virtual Visit Consent   Lauren Espinoza, you are scheduled for a virtual visit with a [facility] provider today. Just as with appointments in the office, your consent must be obtained to participate. Your consent will be active for this visit and any virtual visit you may have with one of our providers in the next 365 days. If you have a MyChart account, a copy of this consent can be sent to you electronically.  As this is a virtual visit, video technology does not allow for your provider to perform a traditional examination. This may limit your provider's ability to fully assess your condition. If your provider identifies any concerns that need to be evaluated in person or the need to arrange testing (such as labs, EKG, etc.), we will make arrangements to do so. Although advances in technology are sophisticated, we cannot ensure that it will always work on either your end or our end. If the connection with a video visit is poor, the visit may have to be switched to a telephone visit. With either a video or telephone visit, we are not always able to ensure that we have a secure connection.  By engaging in this virtual visit, you consent to the provision of healthcare and authorize for your insurance to be billed (if applicable) for the services provided during this visit. Depending on your insurance coverage, you may receive a charge related to this service.  I need to obtain your verbal consent now. Are you willing to proceed with your visit today? Lauren Espinoza has provided verbal consent on 12/10/2022 for a virtual visit (video or telephone). Lauren Rodney, FNP  Date: 12/10/2022 1:50 PM  Virtual Visit via Video Note   I, Lauren Espinoza, connected with  Lauren Espinoza  ([id number], October 23, 1970) on 12/10/22 at  2:00 PM EDT by a video-enabled telemedicine application and verified that I am speaking with the correct person using two identifiers.  Location: Patient: Virtual Visit Location Patient:  Home Provider: Virtual Visit Location Provider: Home Office   I discussed the limitations of evaluation and management by telemedicine and the availability of in person appointments. The patient expressed understanding and agreed to proceed.    History of Present Illness: Lauren Espinoza is a 52 y.o. who identifies as a female who was assigned female at birth, and is being seen today for UTI symptoms that started .  HPI: Dysuria  This is a new problem. The current episode started in the past 7 days. The problem has been gradually worsening. The quality of the pain is described as aching. The pain is at a severity of 6/10. The pain is mild. Maximum temperature: 61F. Associated symptoms include frequency and urgency. Pertinent negatives include no hematuria, hesitancy, nausea or vomiting. Associated symptoms comments: Odor . She has tried increased fluids for the symptoms. The treatment provided mild relief.    Problems:  Patient Active Problem List   Diagnosis Date Noted   Symptomatic varicose veins, right 11/27/2022   Morbid obesity (HCC) 07/18/2021   Acute gout of right foot 04/06/2021   Hypothyroidism 09/06/2020   Menopausal symptoms 09/06/2020   Acute non-recurrent pansinusitis 04/08/2020   Diabetes mellitus without complication (HCC) 08/07/2012   BACK PAIN, UPPER 09/03/2008   Essential hypertension 04/20/2008   Seizures (HCC) 04/20/2008   DVT, HX OF 04/20/2008    Allergies: No Known Allergies Medications:  Current Outpatient Medications:    cephALEXin (KEFLEX) 500 MG capsule, Take 1 capsule (500 mg total) by mouth  2 (two) times daily., Disp: 14 capsule, Rfl: 0   allopurinol (ZYLOPRIM) 300 MG tablet, Take 1 tablet (300 mg total) by mouth daily., Disp: 90 tablet, Rfl: 3   aspirin 325 MG tablet, Take 325 mg by mouth daily., Disp: , Rfl:    atorvastatin (LIPITOR) 40 MG tablet, Take 1 tablet (40 mg total) by mouth daily., Disp: 90 tablet, Rfl: 3   Black Cohosh 540 MG CAPS, Take by  mouth daily at 12 noon., Disp: , Rfl:    Blood Glucose Monitoring Suppl (FREESTYLE LITE) DEVI, Check blood sugar once daily, Disp: 1 each, Rfl: 0   FLUoxetine (PROZAC) 20 MG capsule, Take 1 capsule (20 mg total) by mouth daily., Disp: 180 capsule, Rfl: 4   furosemide (LASIX) 20 MG tablet, Take one tablet by mouth once daily as needed for swelling, Disp: 90 tablet, Rfl: 3   glucose blood (FREESTYLE LITE) test strip, Use as instructed daily, Disp: 100 each, Rfl: 3   hydrochlorothiazide (HYDRODIURIL) 25 MG tablet, Take 1 tablet (25 mg total) by mouth daily., Disp: 90 tablet, Rfl: 3   levothyroxine (SYNTHROID) 75 MCG tablet, Take 1 tablet (75 mcg total) by mouth daily., Disp: 90 tablet, Rfl: 3   lisinopril (ZESTRIL) 5 MG tablet, Take 1 tablet (5 mg total) by mouth daily., Disp: 90 tablet, Rfl: 3   PHENobarbital (LUMINAL) 97.2 MG tablet, Take 1 tablet (97.2 mg total) by mouth 2 (two) times daily., Disp: 180 tablet, Rfl: 1   potassium chloride (KLOR-CON 10) 10 MEQ tablet, Take 1 tablet (10 mEq total) by mouth daily., Disp: 90 tablet, Rfl: 3   tirzepatide (MOUNJARO) 15 MG/0.5ML Pen, Inject 15 mg into the skin once a week., Disp: 18 mL, Rfl: 0  Observations/Objective: Patient is well-developed, well-nourished in no acute distress.  Resting comfortably  at home.  Head is normocephalic, atraumatic.  No labored breathing.  Speech is clear and coherent with logical content.  Patient is alert and oriented at baseline.  No flank pain  Assessment and Plan: 1. UTI symptoms - cephALEXin (KEFLEX) 500 MG capsule; Take 1 capsule (500 mg total) by mouth 2 (two) times daily.  Dispense: 14 capsule; Refill: 0  Force fluids AZO over the counter X2 days Follow up if symptoms worsen or do not improve   Follow Up Instructions: I discussed the assessment and treatment plan with the patient. The patient was provided an opportunity to ask questions and all were answered. The patient agreed with the plan and  demonstrated an understanding of the instructions.  A copy of instructions were sent to the patient via MyChart unless otherwise noted below.    The patient was advised to call back or seek an in-person evaluation if the symptoms worsen or if the condition fails to improve as anticipated.  Time:  I spent 6 minutes with the patient via telehealth technology discussing the above problems/concerns.    Lauren Rodney, FNP

## 2022-12-14 DIAGNOSIS — R252 Cramp and spasm: Secondary | ICD-10-CM | POA: Diagnosis not present

## 2022-12-14 DIAGNOSIS — I87391 Chronic venous hypertension (idiopathic) with other complications of right lower extremity: Secondary | ICD-10-CM | POA: Diagnosis not present

## 2022-12-14 DIAGNOSIS — I872 Venous insufficiency (chronic) (peripheral): Secondary | ICD-10-CM | POA: Diagnosis not present

## 2022-12-14 DIAGNOSIS — R600 Localized edema: Secondary | ICD-10-CM | POA: Diagnosis not present

## 2022-12-14 DIAGNOSIS — I890 Lymphedema, not elsewhere classified: Secondary | ICD-10-CM | POA: Diagnosis not present

## 2022-12-25 ENCOUNTER — Encounter (HOSPITAL_COMMUNITY): Payer: Self-pay | Admitting: Pharmacist

## 2022-12-25 ENCOUNTER — Other Ambulatory Visit: Payer: Self-pay

## 2022-12-25 ENCOUNTER — Other Ambulatory Visit (HOSPITAL_COMMUNITY): Payer: Self-pay

## 2023-01-02 ENCOUNTER — Other Ambulatory Visit: Payer: BC Managed Care – PPO

## 2023-01-08 ENCOUNTER — Other Ambulatory Visit (INDEPENDENT_AMBULATORY_CARE_PROVIDER_SITE_OTHER): Payer: BC Managed Care – PPO

## 2023-01-08 ENCOUNTER — Encounter: Payer: Self-pay | Admitting: Family Medicine

## 2023-01-08 ENCOUNTER — Ambulatory Visit: Payer: BC Managed Care – PPO | Admitting: Family Medicine

## 2023-01-08 ENCOUNTER — Other Ambulatory Visit: Payer: BC Managed Care – PPO

## 2023-01-08 ENCOUNTER — Other Ambulatory Visit (HOSPITAL_COMMUNITY): Payer: Self-pay

## 2023-01-08 VITALS — BP 100/70 | HR 82 | Temp 98.8°F | Wt 267.0 lb

## 2023-01-08 DIAGNOSIS — E876 Hypokalemia: Secondary | ICD-10-CM | POA: Diagnosis not present

## 2023-01-08 DIAGNOSIS — K219 Gastro-esophageal reflux disease without esophagitis: Secondary | ICD-10-CM | POA: Diagnosis not present

## 2023-01-08 DIAGNOSIS — Z6839 Body mass index (BMI) 39.0-39.9, adult: Secondary | ICD-10-CM | POA: Diagnosis not present

## 2023-01-08 MED ORDER — OMEPRAZOLE 40 MG PO CPDR
40.0000 mg | DELAYED_RELEASE_CAPSULE | Freq: Every day | ORAL | 3 refills | Status: DC
  Filled 2023-01-08 (×2): qty 90, 90d supply, fill #0
  Filled 2023-03-22: qty 90, 90d supply, fill #1
  Filled 2023-06-18: qty 90, 90d supply, fill #2
  Filled 2023-10-08: qty 90, 90d supply, fill #3

## 2023-01-08 NOTE — Progress Notes (Signed)
   Subjective:    Patient ID: Lauren Espinoza, female    DOB: 08/12/1970, 51 y.o.   MRN: [id number]  HPI Here to discuss an episode that occurred at home yesterday after 10 minutes after she ate a light lunch of a pork chop and some stuffing. She suddenly felt very hot and she became sweaty. Then she became veyr nauseated and she started to vomit. At the same time she had some abdominal cramps and she had loose diarrhea. All this resolved in about 30 minutes, and she felt fine after that. She has felt fine last night and today. She asks if this could be related to the [facility] she is taking. She notes that she had mild symptoms like this each time we have increased her dose, but she has been on her current dose for the past 6 months. She also notes that she has had more heartburn than usual for the past month. She sometimes takes TUMS for this.    Review of Systems  Constitutional: Negative.   Respiratory: Negative.    Cardiovascular: Negative.   Gastrointestinal:  Positive for abdominal pain, diarrhea, nausea and vomiting. Negative for abdominal distention, blood in stool and constipation.  Genitourinary: Negative.        Objective:   Physical Exam Constitutional:      Appearance: Normal appearance. She is not ill-appearing.  Cardiovascular:     Rate and Rhythm: Normal rate and regular rhythm.     Pulses: Normal pulses.     Heart sounds: Normal heart sounds.  Pulmonary:     Effort: Pulmonary effort is normal.     Breath sounds: Normal breath sounds.  Abdominal:     General: Abdomen is flat. There is no distension.     Palpations: Abdomen is soft. There is no mass.     Tenderness: There is no guarding or rebound.     Hernia: No hernia is present.     Comments: Mild LUQ tenderness  Neurological:     Mental Status: She is alert.           Assessment & Plan:  Her recent GI distress is likely due a combination of Mounjaro side effects as well as GERD. She will start taking  Omeprazole 40 mg every morning. Follow up as needed.  Gershon Crane, MD

## 2023-01-09 LAB — BASIC METABOLIC PANEL
BUN: 10 mg/dL (ref 6–23)
CO2: 30 meq/L (ref 19–32)
Calcium: 9.1 mg/dL (ref 8.4–10.5)
Chloride: 99 meq/L (ref 96–112)
Creatinine, Ser: 0.66 mg/dL (ref 0.40–1.20)
GFR: 102.7 mL/min (ref >60.00)
Glucose, Bld: 81 mg/dL (ref 70–99)
Potassium: 3.3 meq/L — ABNORMAL LOW (ref 3.5–5.1)
Sodium: 139 meq/L (ref 135–145)

## 2023-01-10 ENCOUNTER — Other Ambulatory Visit: Payer: Self-pay

## 2023-01-10 ENCOUNTER — Other Ambulatory Visit (HOSPITAL_COMMUNITY): Payer: Self-pay

## 2023-01-10 MED ORDER — POTASSIUM CHLORIDE ER 10 MEQ PO TBCR
10.0000 meq | EXTENDED_RELEASE_TABLET | Freq: Every day | ORAL | 3 refills | Status: DC
  Filled 2023-01-10: qty 90, fill #0

## 2023-01-22 ENCOUNTER — Other Ambulatory Visit (HOSPITAL_COMMUNITY): Payer: Self-pay

## 2023-01-22 ENCOUNTER — Encounter: Payer: Self-pay | Admitting: Family Medicine

## 2023-01-22 ENCOUNTER — Other Ambulatory Visit: Payer: Self-pay

## 2023-01-22 MED ORDER — POTASSIUM CHLORIDE CRYS ER 20 MEQ PO TBCR
20.0000 meq | EXTENDED_RELEASE_TABLET | Freq: Every day | ORAL | 3 refills | Status: DC
  Filled 2023-01-22: qty 90, 90d supply, fill #0
  Filled 2023-04-18: qty 90, 90d supply, fill #1
  Filled 2023-07-12: qty 90, 90d supply, fill #2

## 2023-01-22 NOTE — Telephone Encounter (Signed)
Send pt a MyChart message regarding current potassium dosing

## 2023-01-24 ENCOUNTER — Encounter: Payer: Self-pay | Admitting: Family Medicine

## 2023-01-24 ENCOUNTER — Telehealth: Payer: BC Managed Care – PPO | Admitting: Family Medicine

## 2023-01-24 DIAGNOSIS — J029 Acute pharyngitis, unspecified: Secondary | ICD-10-CM | POA: Diagnosis not present

## 2023-01-24 MED ORDER — CEFUROXIME AXETIL 500 MG PO TABS
500.0000 mg | ORAL_TABLET | Freq: Two times a day (BID) | ORAL | 0 refills | Status: AC
Start: 2023-01-24 — End: 2023-02-03

## 2023-01-24 NOTE — Progress Notes (Signed)
Subjective:    Patient ID: Lauren Espinoza, female    DOB: [birth year], 52 y.o.   MRN: [id number]  HPI Virtual Visit via Video Note  I connected with the patient on 01/24/23 at  8:45 AM EST by a video enabled telemedicine application and verified that I am speaking with the correct person using two identifiers.  Location patient: home Location provider:work or home office Persons participating in the virtual visit: patient, provider  I discussed the limitations of evaluation and management by telemedicine and the availability of in person appointments. The patient expressed understanding and agreed to proceed.   HPI: Here for 4 days of stuffy head, pain in the right side of her throat, swollen nodes under her right jaw, right ear pain, and a dry cough. No fever. Using Advil and Mucinex. Her daughter has similar symptoms.    ROS: See pertinent positives and negatives per HPI.  Past Medical History:  Diagnosis Date   BACK PAIN, UPPER 09/03/2008   Diabetes mellitus    DVT, HX OF 04/20/2008   Edema    Hyperlipidemia    HYPERTENSION 04/20/2008   Left shoulder pain    sees Dr Rodman Key DISORDER 04/20/2008   last seizure  1995    Past Surgical History:  Procedure Laterality Date   CESAREAN SECTION     CHOLECYSTECTOMY     COLONOSCOPY  03/03/2020   per Dr. Loreta Ave, clear, repeat in 10 yrs   ENDOMETRIAL ABLATION  08/09/2011   per Dr. Henderson Cloud     Family History  Problem Relation Age of Onset   Diabetes Father    Diabetes Maternal Uncle    Diabetes Paternal Grandmother    Diabetes Paternal Grandfather      Current Outpatient Medications:    cefUROXime (CEFTIN) 500 MG tablet, Take 1 tablet (500 mg total) by mouth 2 (two) times daily with a meal for 10 days., Disp: 20 tablet, Rfl: 0   allopurinol (ZYLOPRIM) 300 MG tablet, Take 1 tablet (300 mg total) by mouth daily., Disp: 90 tablet, Rfl: 3   aspirin 325 MG tablet, Take 325 mg by mouth daily., Disp: , Rfl:     atorvastatin (LIPITOR) 40 MG tablet, Take 1 tablet (40 mg total) by mouth daily., Disp: 90 tablet, Rfl: 3   Blood Glucose Monitoring Suppl (FREESTYLE LITE) DEVI, Check blood sugar once daily, Disp: 1 each, Rfl: 0   FLUoxetine (PROZAC) 20 MG capsule, Take 1 capsule (20 mg total) by mouth daily., Disp: 180 capsule, Rfl: 4   furosemide (LASIX) 20 MG tablet, Take one tablet by mouth once daily as needed for swelling, Disp: 90 tablet, Rfl: 3   glucose blood (FREESTYLE LITE) test strip, Use as instructed daily, Disp: 100 each, Rfl: 3   hydrochlorothiazide (HYDRODIURIL) 25 MG tablet, Take 1 tablet (25 mg total) by mouth daily., Disp: 90 tablet, Rfl: 3   levothyroxine (SYNTHROID) 75 MCG tablet, Take 1 tablet (75 mcg total) by mouth daily., Disp: 90 tablet, Rfl: 3   lisinopril (ZESTRIL) 5 MG tablet, Take 1 tablet (5 mg total) by mouth daily., Disp: 90 tablet, Rfl: 3   omeprazole (PRILOSEC) 40 MG capsule, Take 1 capsule (40 mg total) by mouth daily., Disp: 90 capsule, Rfl: 3   PHENobarbital (LUMINAL) 97.2 MG tablet, Take 1 tablet (97.2 mg total) by mouth 2 (two) times daily., Disp: 180 tablet, Rfl: 1   potassium chloride SA (KLOR-CON M) 20 MEQ tablet, Take 1 tablet (20 mEq total) by mouth  daily., Disp: 90 tablet, Rfl: 3   tirzepatide (MOUNJARO) 15 MG/0.5ML Pen, Inject 15 mg into the skin once a week., Disp: 18 mL, Rfl: 0  EXAM:  VITALS per patient if applicable:  GENERAL: alert, oriented, appears well and in no acute distress  HEENT: atraumatic, conjunttiva clear, no obvious abnormalities on inspection of external nose and ears  NECK: normal movements of the head and neck  LUNGS: on inspection no signs of respiratory distress, breathing rate appears normal, no obvious gross SOB, gasping or wheezing  CV: no obvious cyanosis  MS: moves all visible extremities without noticeable abnormality  PSYCH/NEURO: pleasant and cooperative, no obvious depression or anxiety, speech and thought processing grossly  intact  ASSESSMENT AND PLAN: Pharyngitis, likely due to Strep. Treat with 10 days of Cefuroxime.  Gershon Crane, MD  Discussed the following assessment and plan:  No diagnosis found.     I discussed the assessment and treatment plan with the patient. The patient was provided an opportunity to ask questions and all were answered. The patient agreed with the plan and demonstrated an understanding of the instructions.   The patient was advised to call back or seek an in-person evaluation if the symptoms worsen or if the condition fails to improve as anticipated.      Review of Systems     Objective:   Physical Exam        Assessment & Plan:

## 2023-01-27 ENCOUNTER — Other Ambulatory Visit (HOSPITAL_COMMUNITY): Payer: Self-pay

## 2023-02-13 ENCOUNTER — Other Ambulatory Visit: Payer: Self-pay

## 2023-03-05 ENCOUNTER — Other Ambulatory Visit: Payer: Self-pay

## 2023-03-22 ENCOUNTER — Other Ambulatory Visit: Payer: Self-pay | Admitting: Family Medicine

## 2023-03-22 ENCOUNTER — Other Ambulatory Visit (HOSPITAL_BASED_OUTPATIENT_CLINIC_OR_DEPARTMENT_OTHER): Payer: Self-pay

## 2023-03-23 ENCOUNTER — Other Ambulatory Visit: Payer: Self-pay

## 2023-03-23 MED ORDER — MOUNJARO 15 MG/0.5ML ~~LOC~~ SOAJ
15.0000 mg | SUBCUTANEOUS | 0 refills | Status: DC
  Filled 2023-03-23 – 2023-03-28 (×2): qty 2, 28d supply, fill #0
  Filled 2023-04-18 – 2023-04-25 (×2): qty 2, 28d supply, fill #1
  Filled 2023-05-23: qty 2, 28d supply, fill #2
  Filled 2023-06-18: qty 2, 28d supply, fill #3
  Filled 2023-07-18: qty 2, 28d supply, fill #4
  Filled 2023-08-16: qty 2, 28d supply, fill #5
  Filled 2023-09-11: qty 2, 28d supply, fill #6
  Filled 2023-10-11: qty 2, 28d supply, fill #7
  Filled 2023-11-02: qty 2, 28d supply, fill #8

## 2023-03-24 ENCOUNTER — Other Ambulatory Visit (HOSPITAL_COMMUNITY): Payer: Self-pay

## 2023-03-26 ENCOUNTER — Other Ambulatory Visit (HOSPITAL_COMMUNITY): Payer: Self-pay

## 2023-03-26 ENCOUNTER — Other Ambulatory Visit: Payer: Self-pay

## 2023-03-28 ENCOUNTER — Other Ambulatory Visit: Payer: Self-pay

## 2023-04-03 DIAGNOSIS — H53143 Visual discomfort, bilateral: Secondary | ICD-10-CM | POA: Diagnosis not present

## 2023-04-03 LAB — HM DIABETES EYE EXAM

## 2023-04-18 ENCOUNTER — Other Ambulatory Visit: Payer: Self-pay | Admitting: Family Medicine

## 2023-04-18 ENCOUNTER — Other Ambulatory Visit: Payer: Self-pay

## 2023-04-20 ENCOUNTER — Other Ambulatory Visit (HOSPITAL_COMMUNITY): Payer: Self-pay

## 2023-04-20 MED ORDER — PHENOBARBITAL 97.2 MG PO TABS
97.2000 mg | ORAL_TABLET | Freq: Two times a day (BID) | ORAL | 1 refills | Status: DC
  Filled 2023-04-20 – 2023-04-23 (×2): qty 180, 90d supply, fill #0
  Filled 2023-07-18: qty 180, 90d supply, fill #1

## 2023-04-20 NOTE — Telephone Encounter (Signed)
Pt LOV was 12/20/22 Last refill was done on 11/02/22 Please advise

## 2023-04-23 ENCOUNTER — Other Ambulatory Visit: Payer: Self-pay

## 2023-04-23 ENCOUNTER — Other Ambulatory Visit (HOSPITAL_COMMUNITY): Payer: Self-pay

## 2023-04-23 DIAGNOSIS — L720 Epidermal cyst: Secondary | ICD-10-CM | POA: Diagnosis not present

## 2023-04-23 DIAGNOSIS — L218 Other seborrheic dermatitis: Secondary | ICD-10-CM | POA: Diagnosis not present

## 2023-04-24 ENCOUNTER — Other Ambulatory Visit (HOSPITAL_COMMUNITY): Payer: Self-pay

## 2023-04-24 MED ORDER — CLOBETASOL PROPIONATE 0.05 % EX SOLN
CUTANEOUS | 3 refills | Status: AC
  Filled 2023-04-24 (×2): qty 50, 30d supply, fill #0
  Filled 2023-05-23: qty 50, 30d supply, fill #1
  Filled 2023-10-08: qty 50, 30d supply, fill #2
  Filled 2023-11-02: qty 50, 30d supply, fill #3

## 2023-04-24 MED ORDER — KETOCONAZOLE 2 % EX SHAM
MEDICATED_SHAMPOO | CUTANEOUS | 3 refills | Status: AC
  Filled 2023-04-24 (×2): qty 120, 30d supply, fill #0
  Filled 2023-06-05: qty 120, 30d supply, fill #1
  Filled 2023-10-08: qty 120, 30d supply, fill #2

## 2023-04-25 ENCOUNTER — Other Ambulatory Visit: Payer: Self-pay

## 2023-04-25 ENCOUNTER — Other Ambulatory Visit (HOSPITAL_COMMUNITY): Payer: Self-pay

## 2023-04-25 DIAGNOSIS — Z01419 Encounter for gynecological examination (general) (routine) without abnormal findings: Secondary | ICD-10-CM | POA: Diagnosis not present

## 2023-04-25 DIAGNOSIS — Z1231 Encounter for screening mammogram for malignant neoplasm of breast: Secondary | ICD-10-CM | POA: Diagnosis not present

## 2023-04-25 DIAGNOSIS — Z124 Encounter for screening for malignant neoplasm of cervix: Secondary | ICD-10-CM | POA: Diagnosis not present

## 2023-04-25 MED ORDER — ONDANSETRON 8 MG PO TBDP
8.0000 mg | ORAL_TABLET | Freq: Three times a day (TID) | ORAL | 3 refills | Status: AC
  Filled 2023-04-25: qty 60, 20d supply, fill #0
  Filled 2023-05-23: qty 60, 20d supply, fill #1
  Filled 2023-07-18: qty 60, 20d supply, fill #2
  Filled 2023-09-04: qty 60, 20d supply, fill #3

## 2023-05-15 ENCOUNTER — Other Ambulatory Visit (HOSPITAL_COMMUNITY): Payer: Self-pay

## 2023-05-23 ENCOUNTER — Other Ambulatory Visit (HOSPITAL_COMMUNITY): Payer: Self-pay

## 2023-05-23 ENCOUNTER — Other Ambulatory Visit: Payer: Self-pay | Admitting: Family Medicine

## 2023-05-23 ENCOUNTER — Other Ambulatory Visit: Payer: Self-pay

## 2023-05-24 ENCOUNTER — Other Ambulatory Visit: Payer: Self-pay

## 2023-05-24 ENCOUNTER — Other Ambulatory Visit (HOSPITAL_COMMUNITY): Payer: Self-pay

## 2023-05-24 MED ORDER — ASPIRIN 81 MG PO TBEC
81.0000 mg | DELAYED_RELEASE_TABLET | Freq: Every day | ORAL | 3 refills | Status: DC
  Filled 2023-05-24: qty 90, 90d supply, fill #0

## 2023-05-24 NOTE — Telephone Encounter (Signed)
 Done

## 2023-06-02 IMAGING — DX DG ANKLE COMPLETE 3+V*R*
3 series · 3 of 3 positions shown · non-contrast
Comparison: 12/31/2008

CLINICAL DATA: Chronic right ankle and foot pain

EXAM:
RIGHT ANKLE - COMPLETE 3+ VIEW

[ankle ap]
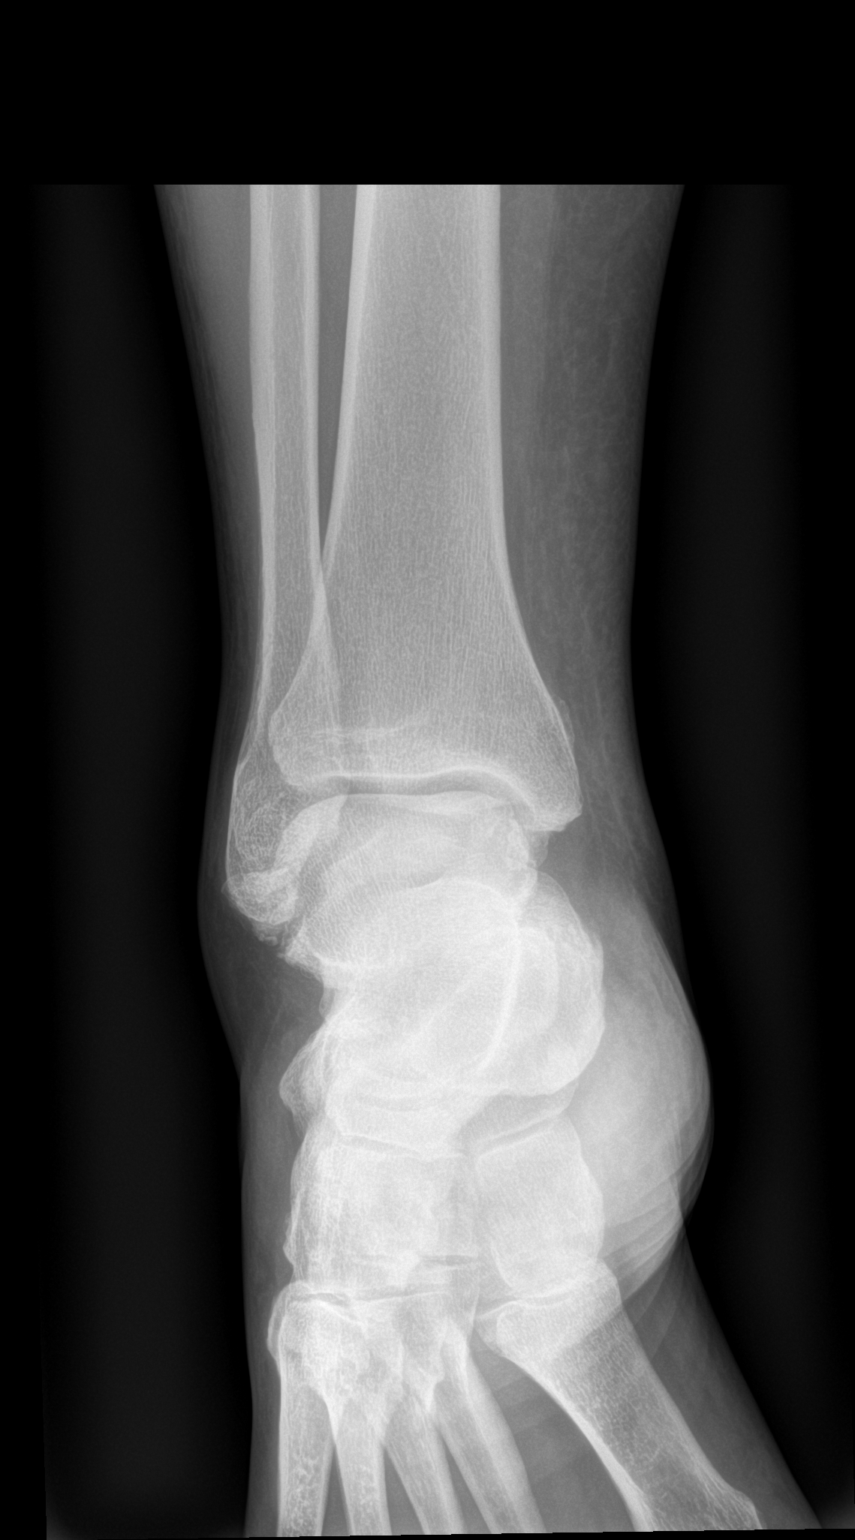

[ankle obl]
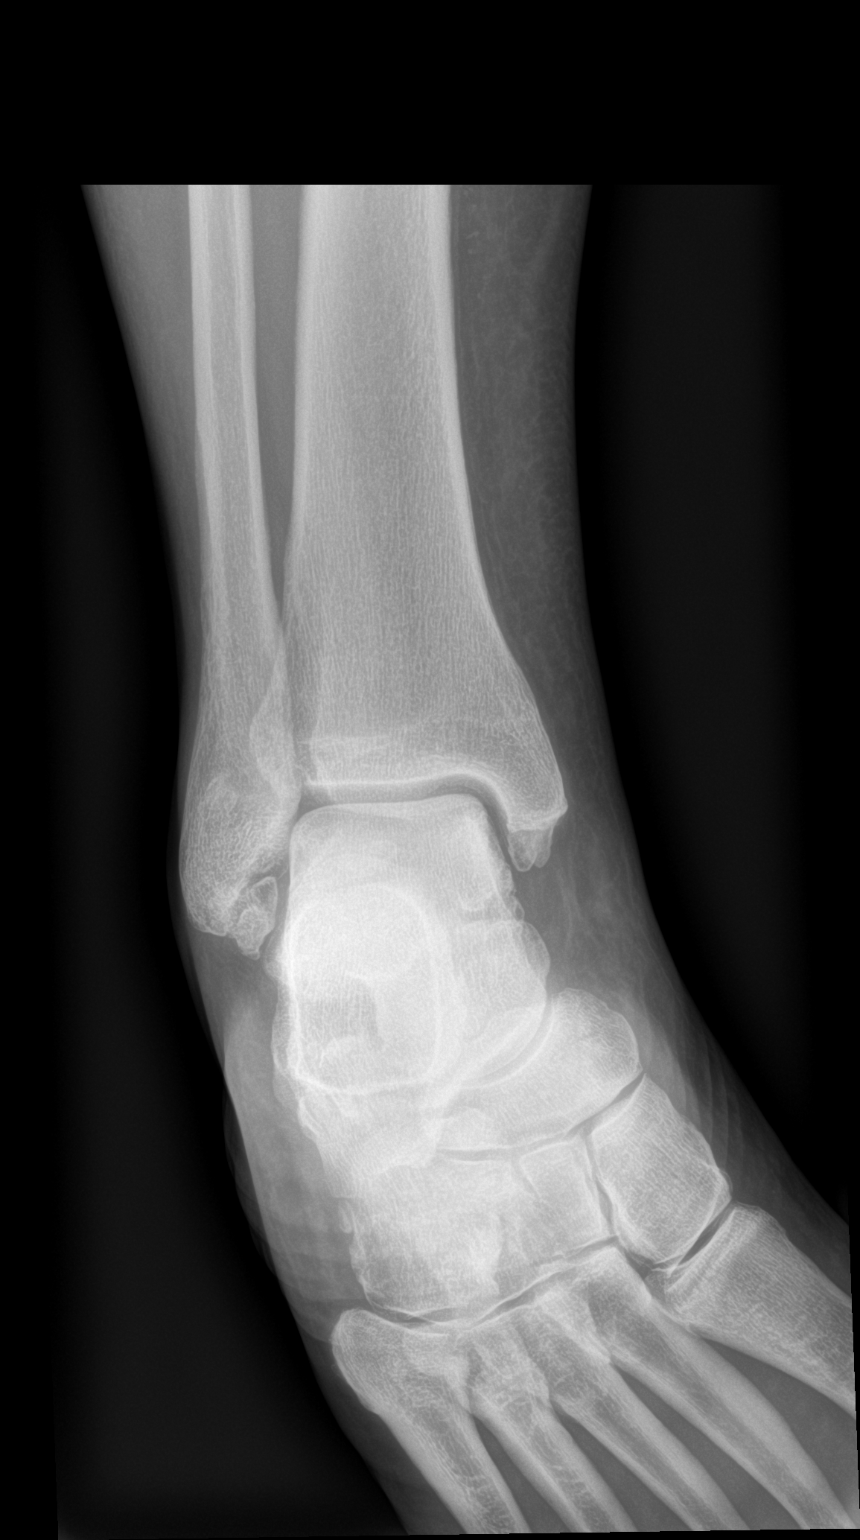

[ankle lat]
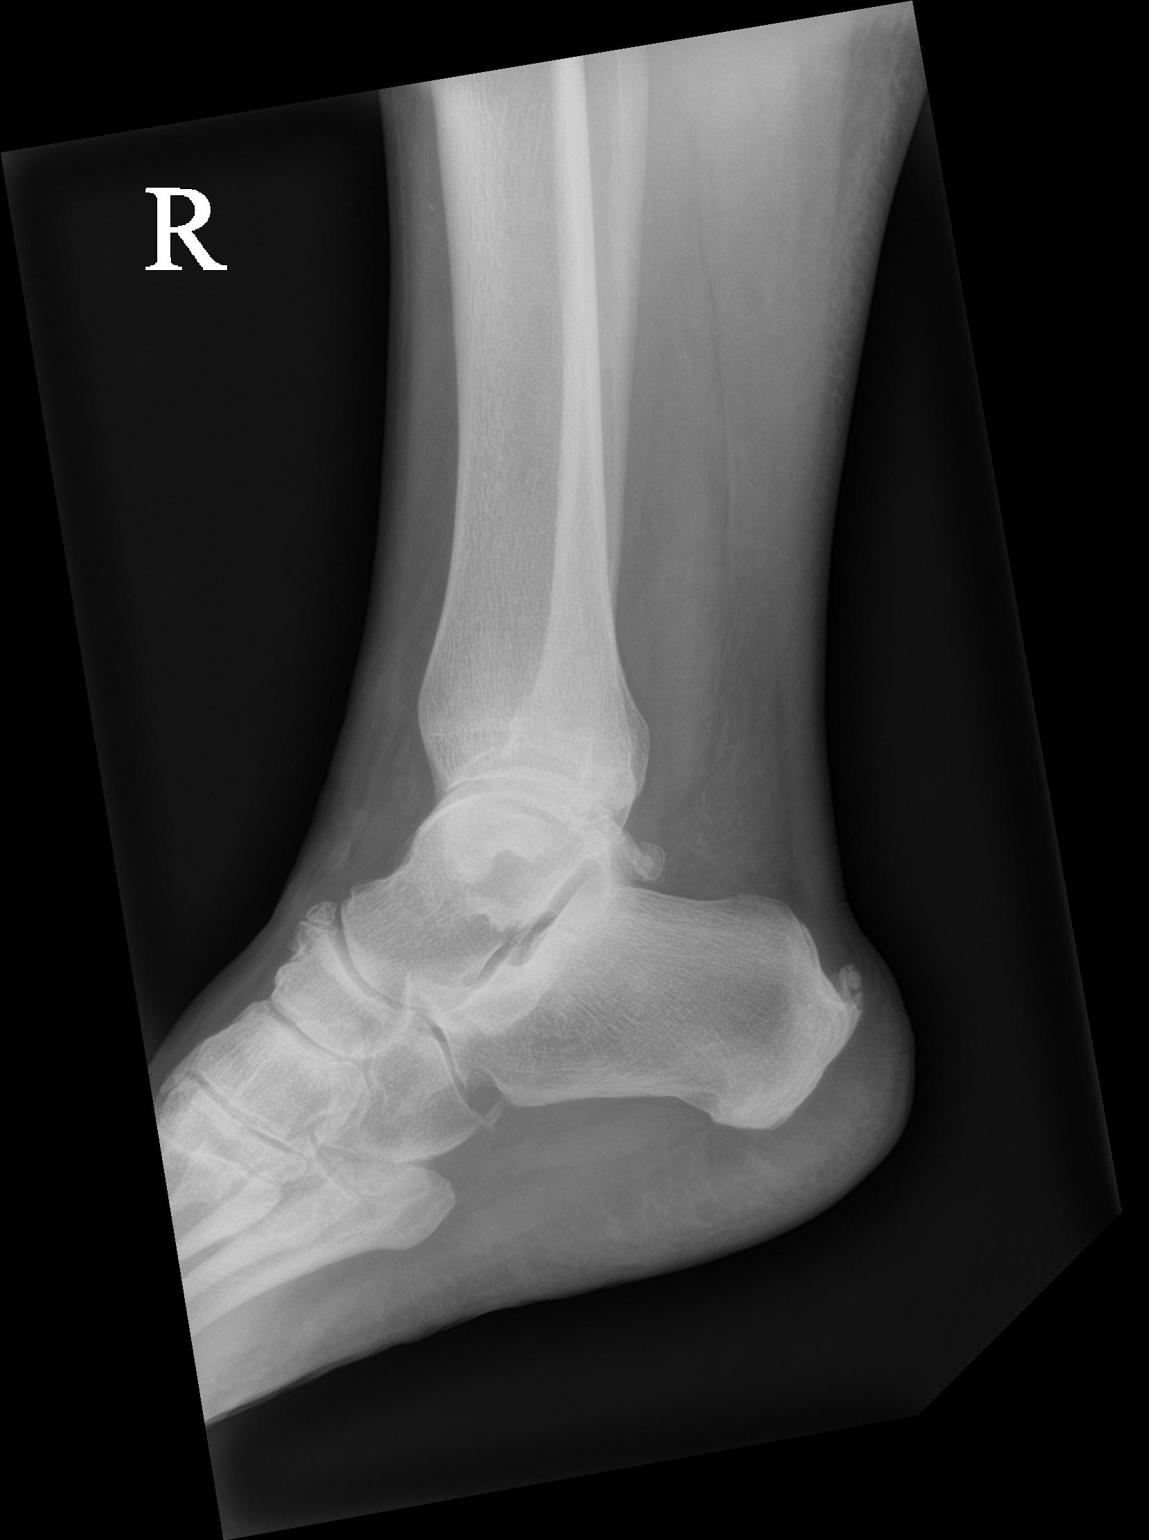

[3 of 3 positions shown; findings below may reference images not displayed]

FINDINGS: Frontal, oblique, and lateral views of the right ankle are obtained.
No acute fracture, subluxation, or dislocation. Well corticated
ossific density adjacent to the lateral malleolus consistent with
sequela of previous trauma. Small superior calcaneal spur. Mild
osteoarthritis of the hindfoot and midfoot. Mild diffuse soft tissue
edema, greatest anteriorly and medially.
IMPRESSION: 1. Soft tissue swelling.
2. No acute fracture.
3. Osteoarthritis.

## 2023-06-05 ENCOUNTER — Other Ambulatory Visit: Payer: Self-pay

## 2023-06-05 ENCOUNTER — Encounter: Payer: Self-pay | Admitting: Pharmacist

## 2023-06-05 ENCOUNTER — Other Ambulatory Visit (HOSPITAL_COMMUNITY): Payer: Self-pay

## 2023-06-18 ENCOUNTER — Other Ambulatory Visit (HOSPITAL_COMMUNITY): Payer: Self-pay

## 2023-06-18 ENCOUNTER — Other Ambulatory Visit: Payer: Self-pay

## 2023-06-18 MED ORDER — FLUOXETINE HCL 20 MG PO CAPS
20.0000 mg | ORAL_CAPSULE | Freq: Every day | ORAL | 4 refills | Status: DC
  Filled 2023-06-18: qty 90, 90d supply, fill #0
  Filled 2023-08-16 – 2023-09-11 (×2): qty 90, 90d supply, fill #1

## 2023-07-12 ENCOUNTER — Other Ambulatory Visit (HOSPITAL_COMMUNITY): Payer: Self-pay

## 2023-07-12 ENCOUNTER — Other Ambulatory Visit: Payer: Self-pay

## 2023-07-18 ENCOUNTER — Other Ambulatory Visit (HOSPITAL_COMMUNITY): Payer: Self-pay

## 2023-07-18 ENCOUNTER — Other Ambulatory Visit: Payer: Self-pay

## 2023-07-24 DIAGNOSIS — H6123 Impacted cerumen, bilateral: Secondary | ICD-10-CM | POA: Diagnosis not present

## 2023-08-17 ENCOUNTER — Other Ambulatory Visit: Payer: Self-pay

## 2023-09-04 ENCOUNTER — Other Ambulatory Visit: Payer: Self-pay

## 2023-09-11 ENCOUNTER — Other Ambulatory Visit: Payer: Self-pay

## 2023-09-11 ENCOUNTER — Other Ambulatory Visit (HOSPITAL_COMMUNITY): Payer: Self-pay

## 2023-10-03 ENCOUNTER — Telehealth: Admitting: Physician Assistant

## 2023-10-03 DIAGNOSIS — R3989 Other symptoms and signs involving the genitourinary system: Secondary | ICD-10-CM

## 2023-10-03 MED ORDER — NITROFURANTOIN MONOHYD MACRO 100 MG PO CAPS: 100.0000 mg | ORAL_CAPSULE | Freq: Two times a day (BID) | ORAL | 0 refills | Status: DC

## 2023-10-03 NOTE — Progress Notes (Signed)

## 2023-10-08 ENCOUNTER — Other Ambulatory Visit (HOSPITAL_COMMUNITY): Payer: Self-pay

## 2023-10-11 ENCOUNTER — Other Ambulatory Visit (HOSPITAL_COMMUNITY): Payer: Self-pay

## 2023-11-02 ENCOUNTER — Other Ambulatory Visit (HOSPITAL_COMMUNITY): Payer: Self-pay

## 2023-11-02 ENCOUNTER — Other Ambulatory Visit: Payer: Self-pay

## 2023-11-02 ENCOUNTER — Other Ambulatory Visit: Payer: Self-pay | Admitting: Family Medicine

## 2023-11-02 MED ORDER — PHENOBARBITAL 97.2 MG PO TABS
97.2000 mg | ORAL_TABLET | Freq: Two times a day (BID) | ORAL | 1 refills | Status: DC
  Filled 2023-11-02: qty 180, 90d supply, fill #0
  Filled 2024-02-05: qty 180, 90d supply, fill #1

## 2023-11-09 ENCOUNTER — Other Ambulatory Visit (HOSPITAL_COMMUNITY): Payer: Self-pay

## 2023-11-09 ENCOUNTER — Other Ambulatory Visit: Payer: Self-pay | Admitting: Family Medicine

## 2023-11-09 MED ORDER — ATORVASTATIN CALCIUM 40 MG PO TABS
40.0000 mg | ORAL_TABLET | Freq: Every day | ORAL | 3 refills | Status: AC
  Filled 2023-11-09: qty 90, 90d supply, fill #0
  Filled 2024-02-08: qty 90, 90d supply, fill #1
  Filled 2024-04-25: qty 90, 90d supply, fill #2

## 2023-11-09 MED ORDER — LISINOPRIL 5 MG PO TABS
5.0000 mg | ORAL_TABLET | Freq: Every day | ORAL | 3 refills | Status: AC
  Filled 2023-11-09: qty 90, 90d supply, fill #0
  Filled 2024-02-08: qty 90, 90d supply, fill #1
  Filled 2024-04-25: qty 90, 90d supply, fill #2

## 2023-11-12 ENCOUNTER — Other Ambulatory Visit: Payer: Self-pay

## 2023-11-25 ENCOUNTER — Telehealth: Admitting: Family

## 2023-11-25 DIAGNOSIS — R399 Unspecified symptoms and signs involving the genitourinary system: Secondary | ICD-10-CM | POA: Diagnosis not present

## 2023-11-25 MED ORDER — PHENAZOPYRIDINE HCL 100 MG PO TABS: 100.0000 mg | ORAL_TABLET | Freq: Three times a day (TID) | ORAL | 0 refills | Status: DC | PRN

## 2023-11-25 MED ORDER — CEPHALEXIN 500 MG PO CAPS: 500.0000 mg | ORAL_CAPSULE | Freq: Two times a day (BID) | ORAL | 0 refills | Status: AC

## 2023-11-25 NOTE — Progress Notes (Signed)
 Virtual Visit Consent   Lauren Espinoza, you are scheduled for a virtual visit with a [facility] provider today. Just as with appointments in the office, your consent must be obtained to participate. Your consent will be active for this visit and any virtual visit you may have with one of our providers in the next 365 days. If you have a MyChart account, a copy of this consent can be sent to you electronically.  As this is a virtual visit, video technology does not allow for your provider to perform a traditional examination. This may limit your provider's ability to fully assess your condition. If your provider identifies any concerns that need to be evaluated in person or the need to arrange testing (such as labs, EKG, etc.), we will make arrangements to do so. Although advances in technology are sophisticated, we cannot ensure that it will always work on either your end or our end. If the connection with a video visit is poor, the visit may have to be switched to a telephone visit. With either a video or telephone visit, we are not always able to ensure that we have a secure connection.  By engaging in this virtual visit, you consent to the provision of healthcare and authorize for your insurance to be billed (if applicable) for the services provided during this visit. Depending on your insurance coverage, you may receive a charge related to this service.  I need to obtain your verbal consent now. Are you willing to proceed with your visit today? Lauren Espinoza has provided verbal consent on 11/25/2023 for a virtual visit (video or telephone). Bari Learn, FNP  Date: 11/25/2023 9:38 AM   Virtual Visit via Video Note   I, Bari Learn, connected with  Lauren Espinoza  ([id number], November 28, 1970) on 11/25/23 at  9:30 AM EDT by a video-enabled telemedicine application and verified that I am speaking with the correct person using two identifiers.  Location: Patient: Virtual Visit Location Patient:  Home Provider: Virtual Visit Location Provider: Home Office   I discussed the limitations of evaluation and management by telemedicine and the availability of in person appointments. The patient expressed understanding and agreed to proceed.    History of Present Illness: Lauren Espinoza is a 53 y.o. who identifies as a female who was assigned female at birth, and is being seen today for UTI symptoms that two days ago. Did a home test that showed nitrates and leuko  HPI: Dysuria  This is a new problem. The current episode started in the past 7 days. The problem occurs every urination. The problem has been gradually worsening. The quality of the pain is described as burning. The pain is at a severity of 7/10. There has been no fever. Associated symptoms include frequency and urgency. Pertinent negatives include no flank pain, hematuria, hesitancy, nausea or vomiting. She has tried increased fluids for the symptoms. The treatment provided no relief.    Problems:  Patient Active Problem List   Diagnosis Date Noted   Symptomatic varicose veins, right 11/27/2022   Morbid obesity (HCC) 07/18/2021   Acute gout of right foot 04/06/2021   Hypothyroidism 09/06/2020   Menopausal symptoms 09/06/2020   Acute non-recurrent pansinusitis 04/08/2020   Diabetes mellitus without complication (HCC) 08/07/2012   BACK PAIN, UPPER 09/03/2008   Essential hypertension 04/20/2008   Seizures (HCC) 04/20/2008   DVT, HX OF 04/20/2008    Allergies: No Known Allergies Medications:  Current Outpatient Medications:    cephALEXin  (KEFLEX )  500 MG capsule, Take 1 capsule (500 mg total) by mouth 2 (two) times daily., Disp: 14 capsule, Rfl: 0   phenazopyridine  (PYRIDIUM ) 100 MG tablet, Take 1 tablet (100 mg total) by mouth 3 (three) times daily as needed for pain., Disp: 10 tablet, Rfl: 0   allopurinol  (ZYLOPRIM ) 300 MG tablet, Take 1 tablet (300 mg total) by mouth daily., Disp: 90 tablet, Rfl: 3   aspirin  EC 81 MG  tablet, Take 1 tablet (81 mg total) by mouth daily. Swallow whole., Disp: 90 tablet, Rfl: 3   atorvastatin  (LIPITOR) 40 MG tablet, Take 1 tablet (40 mg total) by mouth daily., Disp: 90 tablet, Rfl: 3   Blood Glucose Monitoring Suppl (FREESTYLE LITE) DEVI, Check blood sugar once daily, Disp: 1 each, Rfl: 0   clobetasol  (TEMOVATE ) 0.05 % external solution, Apply a small amount to scalp twice a day for itchy/scaly scalp, Disp: 50 mL, Rfl: 3   FLUoxetine  (PROZAC ) 20 MG capsule, Take 1 capsule (20 mg total) by mouth daily., Disp: 180 capsule, Rfl: 4   furosemide  (LASIX ) 20 MG tablet, Take one tablet by mouth once daily as needed for swelling, Disp: 90 tablet, Rfl: 3   glucose blood (FREESTYLE LITE) test strip, Use as instructed daily, Disp: 100 each, Rfl: 3   hydrochlorothiazide  (HYDRODIURIL ) 25 MG tablet, Take 1 tablet (25 mg total) by mouth daily., Disp: 90 tablet, Rfl: 3   ketoconazole  (NIZORAL ) 2 % shampoo, Shampoo liberally as directed three times a week Lather well, let sit 2-3 minutes then rinse., Disp: 120 mL, Rfl: 3   levothyroxine  (SYNTHROID ) 75 MCG tablet, Take 1 tablet (75 mcg total) by mouth daily., Disp: 90 tablet, Rfl: 3   lisinopril  (ZESTRIL ) 5 MG tablet, Take 1 tablet (5 mg total) by mouth daily., Disp: 90 tablet, Rfl: 3   nitrofurantoin , macrocrystal-monohydrate, (MACROBID ) 100 MG capsule, Take 1 capsule (100 mg total) by mouth 2 (two) times daily., Disp: 10 capsule, Rfl: 0   omeprazole  (PRILOSEC) 40 MG capsule, Take 1 capsule (40 mg total) by mouth daily., Disp: 90 capsule, Rfl: 3   ondansetron  (ZOFRAN -ODT) 8 MG disintegrating tablet, Place 1 tablet under the tongue (8 mg total) every 8 (eight) hours as directed., Disp: 60 tablet, Rfl: 3   PHENobarbital  (LUMINAL) 97.2 MG tablet, Take 1 tablet (97.2 mg total) by mouth 2 (two) times daily., Disp: 180 tablet, Rfl: 1   potassium chloride  SA (KLOR-CON  M) 20 MEQ tablet, Take 1 tablet (20 mEq total) by mouth daily., Disp: 90 tablet, Rfl: 3    tirzepatide  (MOUNJARO ) 15 MG/0.5ML Pen, Inject 15 mg into the skin once a week., Disp: 18 mL, Rfl: 0  Observations/Objective: Patient is well-developed, well-nourished in no acute distress.  Resting comfortably  at home.  Head is normocephalic, atraumatic.  No labored breathing.  Speech is clear and coherent with logical content.  Patient is alert and oriented at baseline.    Assessment and Plan: 1. UTI symptoms (Primary) - cephALEXin  (KEFLEX ) 500 MG capsule; Take 1 capsule (500 mg total) by mouth 2 (two) times daily.  Dispense: 14 capsule; Refill: 0 - phenazopyridine  (PYRIDIUM ) 100 MG tablet; Take 1 tablet (100 mg total) by mouth 3 (three) times daily as needed for pain.  Dispense: 10 tablet; Refill: 0  Force fluids AZO over the counter X2 days Follow up if symptoms worsen or do not improve   Follow Up Instructions: I discussed the assessment and treatment plan with the patient. The patient was provided an opportunity to ask questions  and all were answered. The patient agreed with the plan and demonstrated an understanding of the instructions.  A copy of instructions were sent to the patient via MyChart unless otherwise noted below.     The patient was advised to call back or seek an in-person evaluation if the symptoms worsen or if the condition fails to improve as anticipated.    Bari Learn, FNP

## 2023-11-25 NOTE — Patient Instructions (Signed)
 Asymptomatic Bacteriuria Asymptomatic bacteriuria is when you have a lot of germs called bacteria in your pee (urine), but they don't cause symptoms.  You may not need treatment for this condition. But you may be more likely to get an infection in the future. What are the causes? You may get more germs in your pee because of: Germs going into your urinary tract. Your urinary tract includes your kidneys, ureters, bladder, and urethra. Germs can get into your urinary tract during sex. A blockage in your urinary tract. This may be from a kidney stone or from an abnormal growth of cells called a tumor. Bladder problems that stop the bladder from emptying. What increases the risk? You're more likely to get this condition if: You have diabetes. You're an older adult. You're even more likely to get it if you live in a long-term care facility. You're female. You're pregnant. You have kidney stones. You've had a kidney transplant. You have a soft tube called a catheter that drains your pee. What are the signs or symptoms? There are no symptoms. How is this diagnosed? This condition is diagnosed with a pee test. It may be found when a sample of pee is taken to treat another condition, such as a problem with your kidney. If you're in your first trimester of pregnancy, you may be screened for this condition. How is this treated? In most cases, treatment isn't needed. Treating the condition can lead to other problems, such as: A yeast infection. The growth of antibiotic-resistant bacteria. These are germs that don't respond to treatment. But some people need to take antibiotics to prevent a kidney infection. You may need treatment if: You're having a procedure that affects your urinary tract. You've had a kidney transplant. You're pregnant. A kidney infection during pregnancy can lead to: Early labor. Very low birth weight. Newborn death. Follow these instructions at home: Medicines Take your  medicines only as told. Take your antibiotics as told. Do not stop taking them even if you start to feel better. General instructions Closely watch your condition for any changes. Drink enough fluid to keep your pee pale yellow. Pee more often to keep your bladder empty. If you're female, keep the area around your vagina and butt clean. Wipe from front to back after you pee or poop. Use each tissue only once when you wipe. Contact a health care provider if: You have symptoms of an infection. These symptoms may include: A burning feeling, or pain when you pee. A strong need to pee. Peeing more often. Pee that turns discolored or cloudy. Blood in your pee. Pee that smells bad or odd. A fever or chills. You have very bad pain in your back or lower belly. You faint. This information is not intended to replace advice given to you by your health care provider. Make sure you discuss any questions you have with your health care provider. Document Revised: 07/11/2022 Document Reviewed: 07/11/2022 Elsevier Patient Education  2024 [employer].

## 2023-11-28 ENCOUNTER — Encounter: Payer: Self-pay | Admitting: Family Medicine

## 2023-11-28 ENCOUNTER — Other Ambulatory Visit (HOSPITAL_COMMUNITY): Payer: Self-pay

## 2023-11-28 ENCOUNTER — Other Ambulatory Visit: Payer: Self-pay

## 2023-11-28 ENCOUNTER — Ambulatory Visit (INDEPENDENT_AMBULATORY_CARE_PROVIDER_SITE_OTHER): Admitting: Family Medicine

## 2023-11-28 VITALS — BP 98/68 | HR 98 | Temp 98.1°F | Ht 69.0 in | Wt 267.0 lb

## 2023-11-28 DIAGNOSIS — Z0000 Encounter for general adult medical examination without abnormal findings: Secondary | ICD-10-CM

## 2023-11-28 DIAGNOSIS — E039 Hypothyroidism, unspecified: Secondary | ICD-10-CM

## 2023-11-28 LAB — T3, FREE: T3, Free: 3.6 pg/mL (ref 2.3–4.2)

## 2023-11-28 LAB — BASIC METABOLIC PANEL WITH GFR
BUN: 12 mg/dL (ref 6–23)
CO2: 29 meq/L (ref 19–32)
Calcium: 9.3 mg/dL (ref 8.4–10.5)
Chloride: 99 meq/L (ref 96–112)
Creatinine, Ser: 0.59 mg/dL (ref 0.40–1.20)
GFR: 104.86 mL/min (ref >60.00)
Glucose, Bld: 121 mg/dL — ABNORMAL HIGH (ref 70–99)
Potassium: 3.5 meq/L (ref 3.5–5.1)
Sodium: 139 meq/L (ref 135–145)

## 2023-11-28 LAB — TSH: TSH: 1.61 u[IU]/mL (ref 0.35–5.50)

## 2023-11-28 LAB — CBC WITH DIFFERENTIAL/PLATELET
Basophils Absolute: 0.1 K/uL (ref 0.0–0.1)
Basophils Relative: 0.8 % (ref 0.0–3.0)
Eosinophils Absolute: 0 K/uL (ref 0.0–0.7)
Eosinophils Relative: 0.6 % (ref 0.0–5.0)
HCT: 42.9 % (ref 36.0–46.0)
Hemoglobin: 14.6 g/dL (ref 12.0–15.0)
Lymphocytes Relative: 20.7 % (ref 12.0–46.0)
Lymphs Abs: 1.6 K/uL (ref 0.7–4.0)
MCHC: 33.9 g/dL (ref 30.0–36.0)
MCV: 93.3 fl (ref 78.0–100.0)
Monocytes Absolute: 0.6 K/uL (ref 0.1–1.0)
Monocytes Relative: 7.1 % (ref 3.0–12.0)
Neutro Abs: 5.5 K/uL (ref 1.4–7.7)
Neutrophils Relative %: 70.8 % (ref 43.0–77.0)
Platelets: 287 K/uL (ref 150.0–400.0)
RBC: 4.6 Mil/uL (ref 3.87–5.11)
RDW: 13.2 % (ref 11.5–15.5)
WBC: 7.8 K/uL (ref 4.0–10.5)

## 2023-11-28 LAB — URINALYSIS
Bilirubin Urine: NEGATIVE
Hgb urine dipstick: NEGATIVE
Ketones, ur: NEGATIVE
Leukocytes,Ua: NEGATIVE
Nitrite: NEGATIVE
Specific Gravity, Urine: 1.015 (ref 1.000–1.030)
Total Protein, Urine: NEGATIVE
Urine Glucose: NEGATIVE
Urobilinogen, UA: 1 (ref 0.0–1.0)
pH: 6.5 (ref 5.0–8.0)

## 2023-11-28 LAB — HEPATIC FUNCTION PANEL
ALT: 28 U/L (ref 0–35)
AST: 19 U/L (ref 0–37)
Albumin: 4.2 g/dL (ref 3.5–5.2)
Alkaline Phosphatase: 46 U/L (ref 39–117)
Bilirubin, Direct: 0.1 mg/dL (ref 0.0–0.3)
Total Bilirubin: 0.6 mg/dL (ref 0.2–1.2)
Total Protein: 6.5 g/dL (ref 6.0–8.3)

## 2023-11-28 LAB — LIPID PANEL
Cholesterol: 148 mg/dL (ref 0–200)
HDL: 57.9 mg/dL (ref >39.00)
LDL Cholesterol: 56 mg/dL (ref 0–99)
NonHDL: 89.75
Total CHOL/HDL Ratio: 3
Triglycerides: 167 mg/dL — ABNORMAL HIGH (ref 0.0–149.0)
VLDL: 33.4 mg/dL (ref 0.0–40.0)

## 2023-11-28 LAB — HEMOGLOBIN A1C: Hgb A1c MFr Bld: 6.1 % (ref 4.6–6.5)

## 2023-11-28 LAB — T4, FREE: Free T4: 0.71 ng/dL (ref 0.60–1.60)

## 2023-11-28 MED ORDER — FUROSEMIDE 20 MG PO TABS
ORAL_TABLET | ORAL | 3 refills | Status: AC
  Filled 2023-11-28: qty 90, fill #0
  Filled 2024-01-04: qty 90, 90d supply, fill #0

## 2023-11-28 MED ORDER — ALLOPURINOL 300 MG PO TABS
300.0000 mg | ORAL_TABLET | Freq: Every day | ORAL | 3 refills | Status: AC
  Filled 2023-11-28: qty 90, 90d supply, fill #0
  Filled 2024-03-11 – 2024-03-12 (×2): qty 90, 90d supply, fill #1

## 2023-11-28 MED ORDER — LEVOTHYROXINE SODIUM 75 MCG PO TABS
75.0000 ug | ORAL_TABLET | Freq: Every day | ORAL | 3 refills | Status: AC
  Filled 2023-11-28: qty 90, 90d supply, fill #0
  Filled 2024-02-24: qty 90, 90d supply, fill #1

## 2023-11-28 MED ORDER — BUPROPION HCL ER (XL) 150 MG PO TB24
150.0000 mg | ORAL_TABLET | Freq: Every day | ORAL | 3 refills | Status: DC
  Filled 2023-11-28: qty 30, 30d supply, fill #0

## 2023-11-28 MED ORDER — POTASSIUM CHLORIDE ER 10 MEQ PO TBCR
20.0000 meq | EXTENDED_RELEASE_TABLET | Freq: Every day | ORAL | 3 refills | Status: AC
  Filled 2023-11-28 – 2023-12-07 (×2): qty 180, 90d supply, fill #0
  Filled 2024-03-11 – 2024-03-12 (×2): qty 180, 90d supply, fill #1

## 2023-11-28 MED ORDER — HYDROCHLOROTHIAZIDE 25 MG PO TABS
25.0000 mg | ORAL_TABLET | Freq: Every day | ORAL | 3 refills | Status: AC
  Filled 2023-11-28 – 2024-01-04 (×2): qty 90, 90d supply, fill #0
  Filled 2024-03-26: qty 90, 90d supply, fill #1

## 2023-11-28 NOTE — Progress Notes (Signed)
 Subjective:    Patient ID: Lauren Espinoza, female    DOB: November 02, 1970, 53 y.o.   MRN: [id number]  HPI Here for a well exam. She feels well in general, but she is going through menopause and this is causing a lot of problems. She has hot flashes and mood swings. She has tried black cohosh with no relief. She was started on Prozac  by her GYN, Dr. Sarrah, but this did not help. She still has crying spells, she feels anxious at times and depressed at times. She is very frustrated because she has been using the highest dose of Mounjaro , but she has been unable to lose any weight at all.    Review of Systems  Constitutional: Negative.   HENT: Negative.    Eyes: Negative.   Respiratory: Negative.    Cardiovascular: Negative.   Gastrointestinal: Negative.   Genitourinary:  Negative for decreased urine volume, difficulty urinating, dyspareunia, dysuria, enuresis, flank pain, frequency, hematuria, pelvic pain and urgency.  Musculoskeletal: Negative.   Skin: Negative.   Neurological: Negative.  Negative for headaches.  Psychiatric/Behavioral:  Positive for dysphoric mood and sleep disturbance. The patient is nervous/anxious.        Objective:   Physical Exam Constitutional:      General: She is not in acute distress.    Appearance: She is well-developed. She is obese.  HENT:     Head: Normocephalic and atraumatic.     Right Ear: External ear normal.     Left Ear: External ear normal.     Nose: Nose normal.     Mouth/Throat:     Pharynx: No oropharyngeal exudate.  Eyes:     General: No scleral icterus.    Conjunctiva/sclera: Conjunctivae normal.     Pupils: Pupils are equal, round, and reactive to light.  Neck:     Thyroid : No thyromegaly.     Vascular: No JVD.  Cardiovascular:     Rate and Rhythm: Normal rate and regular rhythm.     Pulses: Normal pulses.     Heart sounds: Normal heart sounds. No murmur heard.    No friction rub. No gallop.  Pulmonary:     Effort: Pulmonary  effort is normal. No respiratory distress.     Breath sounds: Normal breath sounds. No wheezing or rales.  Chest:     Chest wall: No tenderness.  Abdominal:     General: Bowel sounds are normal. There is no distension.     Palpations: Abdomen is soft. There is no mass.     Tenderness: There is no abdominal tenderness. There is no guarding or rebound.  Musculoskeletal:        General: No tenderness. Normal range of motion.     Cervical back: Normal range of motion and neck supple.  Lymphadenopathy:     Cervical: No cervical adenopathy.  Skin:    General: Skin is warm and dry.     Findings: No erythema or rash.  Neurological:     General: No focal deficit present.     Mental Status: She is alert and oriented to person, place, and time.     Cranial Nerves: No cranial nerve deficit.     Motor: No abnormal muscle tone.     Coordination: Coordination normal.     Deep Tendon Reflexes: Reflexes are normal and symmetric. Reflexes normal.  Psychiatric:        Behavior: Behavior normal.        Thought Content: Thought content normal.  Judgment: Judgment normal.     Comments: Tearful            Assessment & Plan:  Well exam. We discussed diet and exercise. Get fasting labs. For the menopausal mood swings, she will try Wellbutrin  XL 150 mg daily. Report back in 4 weeks.  Garnette Olmsted, MD

## 2023-11-29 ENCOUNTER — Ambulatory Visit: Payer: Self-pay | Admitting: Family Medicine

## 2023-12-03 ENCOUNTER — Other Ambulatory Visit (HOSPITAL_COMMUNITY): Payer: Self-pay

## 2023-12-03 MED ORDER — BUSPIRONE HCL 10 MG PO TABS
10.0000 mg | ORAL_TABLET | Freq: Two times a day (BID) | ORAL | 2 refills | Status: DC
  Filled 2023-12-03: qty 60, 30d supply, fill #0

## 2023-12-03 NOTE — Telephone Encounter (Signed)
 She is correct, we should not use Wellbutrin  with her hx of seizures. Instead I have sent in a RX for her to try Buspirone . This is related to the Prozac  but it may work better for her.

## 2023-12-04 ENCOUNTER — Other Ambulatory Visit: Payer: Self-pay

## 2023-12-07 ENCOUNTER — Other Ambulatory Visit (HOSPITAL_COMMUNITY): Payer: Self-pay

## 2023-12-07 ENCOUNTER — Other Ambulatory Visit: Payer: Self-pay | Admitting: Family Medicine

## 2023-12-07 ENCOUNTER — Other Ambulatory Visit: Payer: Self-pay

## 2023-12-07 MED ORDER — MOUNJARO 15 MG/0.5ML ~~LOC~~ SOAJ
15.0000 mg | SUBCUTANEOUS | 0 refills | Status: AC
  Filled 2023-12-07: qty 6, 84d supply, fill #0
  Filled 2024-02-05 – 2024-02-24 (×2): qty 6, 84d supply, fill #1

## 2023-12-17 NOTE — Progress Notes (Addendum)
 Lauren Espinoza                                          MRN: [id number]   12/17/2023   The [facility] Quality Team Specialist reviewed this patient medical record for the purposes of chart review for care gap closure. The following were reviewed: chart review for care gap closure-kidney health evaluation for diabetes:eGFR  and uACR.  02/29/2024- could not close [employer] Quality Team

## 2023-12-18 ENCOUNTER — Other Ambulatory Visit (INDEPENDENT_AMBULATORY_CARE_PROVIDER_SITE_OTHER): Admitting: Pharmacist

## 2023-12-18 DIAGNOSIS — E119 Type 2 diabetes mellitus without complications: Secondary | ICD-10-CM

## 2023-12-18 NOTE — Progress Notes (Signed)
 Pharmacy Quality Measure Review  This patient is appearing on a report for being at risk of failing the Glycemic Status Assessment in Diabetes measure this calendar year.    Last documented A1c 6.1% on 11/28/23.   A1c at goal, no action needed at this time.   Catie IVAR Centers, PharmD, [facility] Clinical Pharmacist [phone]

## 2023-12-21 ENCOUNTER — Other Ambulatory Visit (HOSPITAL_COMMUNITY): Payer: Self-pay

## 2023-12-21 ENCOUNTER — Other Ambulatory Visit: Payer: Self-pay

## 2023-12-21 DIAGNOSIS — F419 Anxiety disorder, unspecified: Secondary | ICD-10-CM | POA: Diagnosis not present

## 2023-12-21 DIAGNOSIS — F32A Depression, unspecified: Secondary | ICD-10-CM | POA: Diagnosis not present

## 2023-12-21 MED ORDER — FLUOXETINE HCL 20 MG PO CAPS
20.0000 mg | ORAL_CAPSULE | Freq: Every day | ORAL | 4 refills | Status: AC
  Filled 2023-12-21: qty 90, 90d supply, fill #0
  Filled 2024-03-26: qty 90, 90d supply, fill #1

## 2024-01-04 ENCOUNTER — Other Ambulatory Visit: Payer: Self-pay | Admitting: Family Medicine

## 2024-01-04 ENCOUNTER — Other Ambulatory Visit: Payer: Self-pay

## 2024-01-04 ENCOUNTER — Other Ambulatory Visit (HOSPITAL_COMMUNITY): Payer: Self-pay

## 2024-01-04 MED ORDER — OMEPRAZOLE 40 MG PO CPDR
40.0000 mg | DELAYED_RELEASE_CAPSULE | Freq: Every day | ORAL | 3 refills | Status: AC
  Filled 2024-01-04: qty 90, 90d supply, fill #0
  Filled 2024-04-08: qty 90, 90d supply, fill #1

## 2024-01-07 ENCOUNTER — Other Ambulatory Visit (HOSPITAL_COMMUNITY): Payer: Self-pay

## 2024-01-18 ENCOUNTER — Encounter: Payer: Self-pay | Admitting: Family Medicine

## 2024-01-18 DIAGNOSIS — Z136 Encounter for screening for cardiovascular disorders: Secondary | ICD-10-CM

## 2024-01-22 NOTE — Telephone Encounter (Signed)
 I ordered the test

## 2024-01-23 NOTE — Telephone Encounter (Signed)
 Patient is aware sch for - 04/09/2023 11:00 AM at [facility]

## 2024-01-24 DIAGNOSIS — F32A Depression, unspecified: Secondary | ICD-10-CM | POA: Diagnosis not present

## 2024-01-24 DIAGNOSIS — F418 Other specified anxiety disorders: Secondary | ICD-10-CM | POA: Diagnosis not present

## 2024-02-06 ENCOUNTER — Other Ambulatory Visit (HOSPITAL_COMMUNITY): Payer: Self-pay

## 2024-02-06 ENCOUNTER — Other Ambulatory Visit: Payer: Self-pay

## 2024-02-07 ENCOUNTER — Ambulatory Visit (HOSPITAL_BASED_OUTPATIENT_CLINIC_OR_DEPARTMENT_OTHER)
Admission: RE | Admit: 2024-02-07 | Discharge: 2024-02-07 | Disposition: A | Discharge status: Home / Self Care | Payer: Self-pay | Source: Ambulatory Visit | Attending: Family Medicine | Admitting: Family Medicine

## 2024-02-07 DIAGNOSIS — Z136 Encounter for screening for cardiovascular disorders: Secondary | ICD-10-CM | POA: Insufficient documentation

## 2024-02-08 ENCOUNTER — Ambulatory Visit: Payer: Self-pay | Admitting: Family Medicine

## 2024-02-08 ENCOUNTER — Other Ambulatory Visit (HOSPITAL_COMMUNITY): Payer: Self-pay

## 2024-02-11 NOTE — Telephone Encounter (Signed)
 I'll admit I was pleasantly surprised

## 2024-02-12 NOTE — Telephone Encounter (Signed)
 No, the lung nodule is nothing to worry about

## 2024-02-25 ENCOUNTER — Other Ambulatory Visit (HOSPITAL_COMMUNITY): Payer: Self-pay

## 2024-02-25 ENCOUNTER — Other Ambulatory Visit: Payer: Self-pay

## 2024-02-26 ENCOUNTER — Other Ambulatory Visit: Payer: Self-pay

## 2024-02-27 ENCOUNTER — Other Ambulatory Visit: Payer: Self-pay

## 2024-03-07 ENCOUNTER — Other Ambulatory Visit: Payer: Self-pay

## 2024-03-07 ENCOUNTER — Encounter: Payer: Self-pay | Admitting: Pharmacist

## 2024-03-07 ENCOUNTER — Telehealth: Admitting: Physician Assistant

## 2024-03-07 ENCOUNTER — Other Ambulatory Visit (HOSPITAL_COMMUNITY): Payer: Self-pay

## 2024-03-07 DIAGNOSIS — R3989 Other symptoms and signs involving the genitourinary system: Secondary | ICD-10-CM | POA: Diagnosis not present

## 2024-03-07 MED ORDER — CEPHALEXIN 500 MG PO CAPS
500.0000 mg | ORAL_CAPSULE | Freq: Two times a day (BID) | ORAL | 0 refills | Status: AC
  Filled 2024-03-07 (×2): qty 14, 7d supply, fill #0

## 2024-03-07 NOTE — Progress Notes (Signed)

## 2024-03-12 ENCOUNTER — Other Ambulatory Visit (HOSPITAL_COMMUNITY): Payer: Self-pay

## 2024-03-12 ENCOUNTER — Other Ambulatory Visit (HOSPITAL_BASED_OUTPATIENT_CLINIC_OR_DEPARTMENT_OTHER): Payer: Self-pay

## 2024-03-19 ENCOUNTER — Other Ambulatory Visit (HOSPITAL_COMMUNITY): Payer: Self-pay

## 2024-03-21 ENCOUNTER — Other Ambulatory Visit (HOSPITAL_COMMUNITY): Payer: Self-pay

## 2024-03-22 ENCOUNTER — Other Ambulatory Visit (HOSPITAL_COMMUNITY): Payer: Self-pay

## 2024-03-22 MED ORDER — PREDNISONE 10 MG (21) PO TBPK
ORAL_TABLET | ORAL | 0 refills | Status: DC
  Filled 2024-03-22: qty 21, 6d supply, fill #0

## 2024-03-23 ENCOUNTER — Other Ambulatory Visit (HOSPITAL_COMMUNITY): Payer: Self-pay

## 2024-03-27 ENCOUNTER — Other Ambulatory Visit: Payer: Self-pay

## 2024-04-08 ENCOUNTER — Other Ambulatory Visit: Payer: Self-pay

## 2024-04-08 ENCOUNTER — Other Ambulatory Visit: Payer: Self-pay | Admitting: Family Medicine

## 2024-04-10 MED ORDER — PHENOBARBITAL 97.2 MG PO TABS
97.2000 mg | ORAL_TABLET | Freq: Two times a day (BID) | ORAL | 1 refills | Status: AC
  Filled 2024-04-10: qty 180, 90d supply, fill #0

## 2024-04-13 ENCOUNTER — Other Ambulatory Visit (HOSPITAL_COMMUNITY): Payer: Self-pay

## 2024-04-24 NOTE — Progress Notes (Unsigned)
 "               Odis Mace D.[facility] [address] [place] [address] Phone: [phone]   Assessment and Plan:     1. Left Achilles tendinitis (Primary) 2. Peroneal tendinitis, left -Acute, initial visit - Consistent with strain of left Achilles tendon and peroneal tendons likely due to physical activity with recent snow/icy weather - Start meloxicam  15 mg daily x2 weeks.  If still having pain after 2 weeks, complete 3rd-week of NSAID. May use remaining NSAID as needed once daily for pain control.  Do not to use additional over-the-counter NSAIDs (ibuprofen, naproxen , Advil, Aleve , etc.) while taking prescription NSAIDs.  May use Tylenol  [phone] mg 2 to 3 times a day for breakthrough pain. -Start HEP for ankle    15 additional minutes spent for educating Therapeutic Home Exercise Program.  This included exercises focusing on stretching, strengthening, with focus on eccentric aspects.   Long term goals include an improvement in range of motion, strength, endurance as well as avoiding reinjury. Patient's frequency would include in 1-2 times a day, 3-5 times a week for a duration of 6-12 weeks. Proper technique shown and discussed handout in great detail with ATC.  All questions were discussed and answered.      Pertinent previous records reviewed include none   Follow Up: As needed if no improvement in 4 to 6 weeks.  Could consider bracing, physical therapy, ultrasound, CSI   Subjective:   I, Skip Litke, am serving as a neurosurgeon for Doctor Morene Mace  Chief Complaint: achilles pain   HPI:  04/25/2024 Patient is a 54 year old female with achilles pain. Patient states L achilles pain. Pain started a week ago. No MOI. Pain has decreased some. Decreased ROM. Pain radiates to the side of her foot. Endorses antalgic gait. Ibu for the pain helps a little. Endorses swelling. No numbness or tingling.    Relevant Historical Information: Hypertension,  gout, DM type II  Additional pertinent review of systems negative.  Current Medications[1]   Objective:     Vitals:   04/25/24 1028  Pulse: 87  SpO2: 97%  Weight: 267 lb (121.1 kg)  Height: 5' 9 (1.753 m)      Body mass index is 39.43 kg/m.    Physical Exam:    Gen: Appears well, nad, nontoxic and pleasant Psych: Alert and oriented, appropriate mood and affect Neuro: sensation intact, strength is 5/5 with df/pf/inv/ev, muscle tone wnl Skin: no susupicious lesions or rashes  Left ankle:  No deformity, no swelling or effusion TTP Achilles tendon, peroneal tendons NTTP over fibular head, lat mal, medial mal,  , navicular, base of 5th, ATFL, CFL, deltoid, calcaneous or midfoot ROM DF 30, PF 45, inv/ev intact Negative ant drawer, talar tilt, rotation test, squeeze test. Neg thompson No pain with resisted inversion.  Mild lateral ankle pain with resisted eversion    Electronically signed by:  Odis Mace D.[facility] 10:43 AM 04/25/24     [1]  Current Outpatient Medications:    meloxicam  (MOBIC ) 15 MG tablet, Take 1 tablet daily for 2 weeks.  If still in pain after 2 weeks, take 1 tablet daily for an additional 1 week., Disp: 30 tablet, Rfl: 0   allopurinol  (ZYLOPRIM ) 300 MG tablet, Take 1 tablet (300 mg total) by mouth daily., Disp: 90 tablet, Rfl: 3   atorvastatin  (LIPITOR) 40 MG tablet, Take 1 tablet (40 mg total) by  mouth daily., Disp: 90 tablet, Rfl: 3   Blood Glucose Monitoring Suppl (FREESTYLE LITE) DEVI, Check blood sugar once daily, Disp: 1 each, Rfl: 0   clobetasol  (TEMOVATE ) 0.05 % external solution, Apply a small amount to scalp twice a day for itchy/scaly scalp, Disp: 50 mL, Rfl: 3   FLUoxetine  (PROZAC ) 20 MG capsule, Take 1 capsule (20 mg total) by mouth daily., Disp: 95 capsule, Rfl: 4   furosemide  (LASIX ) 20 MG tablet, Take one tablet by mouth once daily as needed for swelling, Disp: 90 tablet, Rfl: 3   glucose blood (FREESTYLE  LITE) test strip, Use as instructed daily, Disp: 100 each, Rfl: 3   hydrochlorothiazide  (HYDRODIURIL ) 25 MG tablet, Take 1 tablet (25 mg total) by mouth daily., Disp: 90 tablet, Rfl: 3   ketoconazole  (NIZORAL ) 2 % shampoo, Shampoo liberally as directed three times a week Lather well, let sit 2-3 minutes then rinse., Disp: 120 mL, Rfl: 3   levothyroxine  (SYNTHROID ) 75 MCG tablet, Take 1 tablet (75 mcg total) by mouth daily., Disp: 90 tablet, Rfl: 3   lisinopril  (ZESTRIL ) 5 MG tablet, Take 1 tablet (5 mg total) by mouth daily., Disp: 90 tablet, Rfl: 3   omeprazole  (PRILOSEC) 40 MG capsule, Take 1 capsule (40 mg total) by mouth daily., Disp: 90 capsule, Rfl: 3   ondansetron  (ZOFRAN -ODT) 8 MG disintegrating tablet, Place 1 tablet under the tongue (8 mg total) every 8 (eight) hours as directed., Disp: 60 tablet, Rfl: 3   PHENobarbital  (LUMINAL) 97.2 MG tablet, Take 1 tablet (97.2 mg total) by mouth 2 (two) times daily., Disp: 180 tablet, Rfl: 1   potassium chloride  (KLOR-CON  10) 10 MEQ tablet, Take 2 tablets (20 mEq total) by mouth daily., Disp: 180 tablet, Rfl: 3   tirzepatide  (MOUNJARO ) 15 MG/0.5ML Pen, Inject 15 mg into the skin once a week., Disp: 18 mL, Rfl: 0  "

## 2024-04-25 ENCOUNTER — Other Ambulatory Visit (HOSPITAL_COMMUNITY): Payer: Self-pay

## 2024-04-25 ENCOUNTER — Ambulatory Visit: Admitting: Sports Medicine

## 2024-04-25 ENCOUNTER — Other Ambulatory Visit (HOSPITAL_BASED_OUTPATIENT_CLINIC_OR_DEPARTMENT_OTHER): Payer: Self-pay

## 2024-04-25 ENCOUNTER — Other Ambulatory Visit: Payer: Self-pay

## 2024-04-25 VITALS — HR 87 | Ht 69.0 in | Wt 267.0 lb

## 2024-04-25 DIAGNOSIS — M7672 Peroneal tendinitis, left leg: Secondary | ICD-10-CM

## 2024-04-25 DIAGNOSIS — M7662 Achilles tendinitis, left leg: Secondary | ICD-10-CM

## 2024-04-25 MED ORDER — MELOXICAM 15 MG PO TABS
ORAL_TABLET | ORAL | 0 refills | Status: AC
  Filled 2024-04-25: qty 30, 30d supply, fill #0
  Filled 2024-04-25: qty 30, fill #0

## 2024-04-25 NOTE — Patient Instructions (Addendum)
-   Start meloxicam  15 mg daily x2 weeks.  If still having pain after 2 weeks, complete 3rd-week of NSAID. May use remaining NSAID as needed once daily for pain control.  Do not to use additional over-the-counter NSAIDs (ibuprofen, naproxen , Advil, Aleve , etc.) while taking prescription NSAIDs.  May use Tylenol  [phone] mg 2 to 3 times a day for breakthrough pain.  Achilles HEP   As needed follow up, if no improvement 4-6 week follow up
